# Patient Record
Sex: Female | Born: 1965 | Race: White | Hispanic: No | Marital: Married | Smoking: Current every day smoker
Health system: Southern US, Community
[De-identification: ages and names within clinical notes are randomized; demographics above are authoritative.]

## PROBLEM LIST (undated history)

## (undated) DIAGNOSIS — I071 Rheumatic tricuspid insufficiency: Secondary | ICD-10-CM

## (undated) DIAGNOSIS — F419 Anxiety disorder, unspecified: Secondary | ICD-10-CM

## (undated) DIAGNOSIS — R51 Headache: Secondary | ICD-10-CM

## (undated) DIAGNOSIS — R519 Headache, unspecified: Secondary | ICD-10-CM

## (undated) DIAGNOSIS — T7840XA Allergy, unspecified, initial encounter: Secondary | ICD-10-CM

## (undated) DIAGNOSIS — L03211 Cellulitis of face: Secondary | ICD-10-CM

## (undated) DIAGNOSIS — E039 Hypothyroidism, unspecified: Secondary | ICD-10-CM

## (undated) DIAGNOSIS — D6800 Von Willebrand disease, unspecified: Secondary | ICD-10-CM

## (undated) DIAGNOSIS — K859 Acute pancreatitis without necrosis or infection, unspecified: Secondary | ICD-10-CM

## (undated) DIAGNOSIS — H269 Unspecified cataract: Secondary | ICD-10-CM

## (undated) DIAGNOSIS — I340 Nonrheumatic mitral (valve) insufficiency: Secondary | ICD-10-CM

## (undated) DIAGNOSIS — N393 Stress incontinence (female) (male): Secondary | ICD-10-CM

## (undated) DIAGNOSIS — I951 Orthostatic hypotension: Secondary | ICD-10-CM

## (undated) DIAGNOSIS — J0120 Acute ethmoidal sinusitis, unspecified: Secondary | ICD-10-CM

## (undated) DIAGNOSIS — G4730 Sleep apnea, unspecified: Secondary | ICD-10-CM

## (undated) DIAGNOSIS — E0500 Thyrotoxicosis with diffuse goiter without thyrotoxic crisis or storm: Secondary | ICD-10-CM

## (undated) DIAGNOSIS — N904 Leukoplakia of vulva: Secondary | ICD-10-CM

## (undated) DIAGNOSIS — K219 Gastro-esophageal reflux disease without esophagitis: Secondary | ICD-10-CM

## (undated) DIAGNOSIS — E876 Hypokalemia: Secondary | ICD-10-CM

## (undated) DIAGNOSIS — Z973 Presence of spectacles and contact lenses: Secondary | ICD-10-CM

## (undated) DIAGNOSIS — R079 Chest pain, unspecified: Secondary | ICD-10-CM

## (undated) DIAGNOSIS — R112 Nausea with vomiting, unspecified: Secondary | ICD-10-CM

## (undated) DIAGNOSIS — F32A Depression, unspecified: Secondary | ICD-10-CM

## (undated) DIAGNOSIS — E785 Hyperlipidemia, unspecified: Secondary | ICD-10-CM

## (undated) DIAGNOSIS — I10 Essential (primary) hypertension: Secondary | ICD-10-CM

## (undated) DIAGNOSIS — M1990 Unspecified osteoarthritis, unspecified site: Secondary | ICD-10-CM

## (undated) DIAGNOSIS — I6529 Occlusion and stenosis of unspecified carotid artery: Secondary | ICD-10-CM

## (undated) DIAGNOSIS — Z9889 Other specified postprocedural states: Secondary | ICD-10-CM

## (undated) HISTORY — DX: Thyrotoxicosis with diffuse goiter without thyrotoxic crisis or storm: E05.00

## (undated) HISTORY — DX: Hypothyroidism, unspecified: E03.9

## (undated) HISTORY — PX: POLYPECTOMY: SHX149

## (undated) HISTORY — DX: Depression, unspecified: F32.A

## (undated) HISTORY — DX: Orthostatic hypotension: I95.1

## (undated) HISTORY — DX: Occlusion and stenosis of unspecified carotid artery: I65.29

## (undated) HISTORY — DX: Hyperlipidemia, unspecified: E78.5

## (undated) HISTORY — DX: Von Willebrand's disease: D68.0

## (undated) HISTORY — PX: BREAST SURGERY: SHX581

## (undated) HISTORY — PX: CHOLECYSTECTOMY: SHX55

## (undated) HISTORY — DX: Hypokalemia: E87.6

## (undated) HISTORY — DX: Von Willebrand disease, unspecified: D68.00

## (undated) HISTORY — PX: APPENDECTOMY: SHX54

## (undated) HISTORY — DX: Unspecified cataract: H26.9

## (undated) HISTORY — DX: Leukoplakia of vulva: N90.4

## (undated) HISTORY — DX: Acute ethmoidal sinusitis, unspecified: J01.20

## (undated) HISTORY — DX: Stress incontinence (female) (male): N39.3

## (undated) HISTORY — DX: Allergy, unspecified, initial encounter: T78.40XA

## (undated) HISTORY — DX: Cellulitis of face: L03.211

## (undated) HISTORY — PX: EYE SURGERY: SHX253

## (undated) HISTORY — PX: COSMETIC SURGERY: SHX468

## (undated) HISTORY — PX: ABDOMINAL HYSTERECTOMY: SHX81

## (undated) HISTORY — PX: BREAST EXCISIONAL BIOPSY: SUR124

---

## 2006-01-07 ENCOUNTER — Ambulatory Visit: Payer: Self-pay | Admitting: Unknown Physician Specialty

## 2006-08-06 ENCOUNTER — Ambulatory Visit: Payer: Self-pay | Admitting: Gastroenterology

## 2007-09-29 ENCOUNTER — Ambulatory Visit: Payer: Self-pay | Admitting: Internal Medicine

## 2008-01-08 ENCOUNTER — Emergency Department: Payer: Self-pay | Admitting: Emergency Medicine

## 2008-12-22 ENCOUNTER — Ambulatory Visit: Payer: Self-pay | Admitting: Internal Medicine

## 2009-01-03 ENCOUNTER — Ambulatory Visit: Payer: Self-pay | Admitting: Internal Medicine

## 2009-03-13 ENCOUNTER — Ambulatory Visit: Payer: Self-pay | Admitting: Internal Medicine

## 2009-03-22 ENCOUNTER — Ambulatory Visit: Payer: Self-pay | Admitting: Gastroenterology

## 2009-04-19 ENCOUNTER — Ambulatory Visit: Payer: Self-pay | Admitting: Gastroenterology

## 2009-05-11 ENCOUNTER — Ambulatory Visit: Payer: Self-pay | Admitting: Gastroenterology

## 2009-06-17 ENCOUNTER — Emergency Department (HOSPITAL_COMMUNITY): Admission: EM | Admit: 2009-06-17 | Discharge: 2009-06-17 | Payer: Self-pay | Admitting: Emergency Medicine

## 2009-06-17 ENCOUNTER — Emergency Department: Payer: Self-pay | Admitting: Emergency Medicine

## 2009-08-03 ENCOUNTER — Ambulatory Visit: Payer: Self-pay | Admitting: Internal Medicine

## 2010-09-16 ENCOUNTER — Encounter: Payer: Self-pay | Admitting: Internal Medicine

## 2010-09-25 ENCOUNTER — Ambulatory Visit: Payer: Self-pay | Admitting: Internal Medicine

## 2010-10-17 ENCOUNTER — Emergency Department: Payer: Self-pay | Admitting: Orthopaedic Surgery

## 2010-11-05 ENCOUNTER — Ambulatory Visit: Payer: Self-pay | Admitting: Emergency Medicine

## 2010-11-29 LAB — URINALYSIS, ROUTINE W REFLEX MICROSCOPIC
Bilirubin Urine: NEGATIVE
Hgb urine dipstick: NEGATIVE
Ketones, ur: NEGATIVE mg/dL
Nitrite: NEGATIVE
pH: 8 (ref 5.0–8.0)

## 2010-11-29 LAB — URINE MICROSCOPIC-ADD ON

## 2010-11-29 LAB — POCT PREGNANCY, URINE: Preg Test, Ur: NEGATIVE

## 2011-01-09 ENCOUNTER — Ambulatory Visit: Payer: Self-pay | Admitting: Internal Medicine

## 2011-01-15 ENCOUNTER — Ambulatory Visit: Payer: Self-pay | Admitting: Rheumatology

## 2011-01-22 ENCOUNTER — Ambulatory Visit: Payer: Self-pay | Admitting: Emergency Medicine

## 2011-05-27 IMAGING — NM NUCLEAR MEDICINE HEPATOHBILIARY INCLUDE GB
2 series · 12 of 12 positions shown · non-contrast
Comparison: none

REASON FOR EXAM: RUQ Pain
COMMENTS:

[Series 1000: gallbladder dynamic · 4.80mm/px · 6 of 60 frames shown]
[frame 6/60]
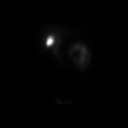
[frame 16/60]
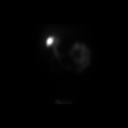
[frame 26/60]
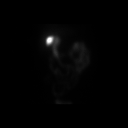
[frame 36/60]
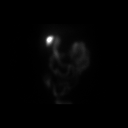
[frame 46/60]
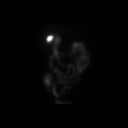
[frame 56/60]
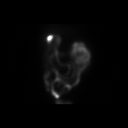

[Series 1000: gallbladder dynamic (results) · 4.80mm/px · 6 of 60 frames shown]
[frame 6/60]
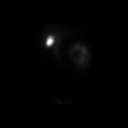
[frame 16/60]
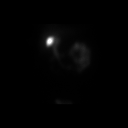
[frame 26/60]
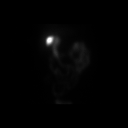
[frame 36/60]
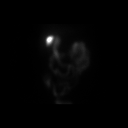
[frame 46/60]
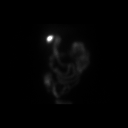
[frame 56/60]
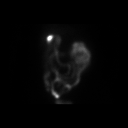

[12 of 12 positions shown; findings below may reference images not displayed]

PROCEDURE:     KNM - KNM HEPATO W/GB EJECT FRACTION  - November 05, 2010 [DATE]

RESULT:     Following intravenous administration of 7.51 mCi technetium 99m
Choletec, there is noted prompt visualization of tracer activity in the
liver at 3 minutes. At 40 minutes tracer activity is visualized in the
gallbladder, common duct and proximal small bowel.

The gallbladder ejection fraction at 30 minutes measures 81%, which is
within the normal range.
IMPRESSION: 1. Normal hepatobiliary scan.
2. The gallbladder ejection fraction measures 81%, which is in the normal
range.

## 2012-01-29 ENCOUNTER — Ambulatory Visit: Payer: Self-pay | Admitting: Internal Medicine

## 2013-04-10 ENCOUNTER — Emergency Department: Payer: Self-pay | Admitting: Emergency Medicine

## 2013-04-12 ENCOUNTER — Emergency Department: Payer: Self-pay | Admitting: Emergency Medicine

## 2013-04-29 ENCOUNTER — Ambulatory Visit: Payer: Self-pay | Admitting: Surgery

## 2013-04-29 LAB — CBC WITH DIFFERENTIAL/PLATELET
Basophil #: 0.1 10*3/uL (ref 0.0–0.1)
Basophil %: 1.1 %
Eosinophil #: 0.1 10*3/uL (ref 0.0–0.7)
Eosinophil %: 0.7 %
HCT: 41.5 % (ref 35.0–47.0)
Lymphocyte #: 2.3 10*3/uL (ref 1.0–3.6)
MCH: 31.4 pg (ref 26.0–34.0)
MCHC: 34.6 g/dL (ref 32.0–36.0)
MCV: 91 fL (ref 80–100)
Monocyte #: 0.9 x10 3/mm (ref 0.2–0.9)
Monocyte %: 7.1 %
Neutrophil %: 72.8 %
Platelet: 256 10*3/uL (ref 150–440)
RDW: 12.8 % (ref 11.5–14.5)

## 2013-04-29 LAB — HEPATIC FUNCTION PANEL A (ARMC)
Albumin: 3.7 g/dL (ref 3.4–5.0)
Alkaline Phosphatase: 65 U/L (ref 50–136)
Bilirubin, Direct: 0.1 mg/dL (ref 0.00–0.20)
Bilirubin,Total: 0.5 mg/dL (ref 0.2–1.0)
SGOT(AST): 16 U/L (ref 15–37)
SGPT (ALT): 17 U/L (ref 12–78)
Total Protein: 6.9 g/dL (ref 6.4–8.2)

## 2013-04-29 LAB — BASIC METABOLIC PANEL
Anion Gap: 3 — ABNORMAL LOW (ref 7–16)
BUN: 11 mg/dL (ref 7–18)
Calcium, Total: 9.1 mg/dL (ref 8.5–10.1)
Co2: 30 mmol/L (ref 21–32)
EGFR (African American): >60
Glucose: 80 mg/dL (ref 65–99)
Osmolality: 276 (ref 275–301)
Potassium: 3.4 mmol/L — ABNORMAL LOW (ref 3.5–5.1)
Sodium: 139 mmol/L (ref 136–145)

## 2013-05-14 ENCOUNTER — Ambulatory Visit: Payer: Self-pay | Admitting: Surgery

## 2013-06-15 ENCOUNTER — Ambulatory Visit: Payer: Self-pay | Admitting: Gynecologic Oncology

## 2013-06-29 ENCOUNTER — Ambulatory Visit: Payer: Self-pay | Admitting: Gynecologic Oncology

## 2013-07-26 ENCOUNTER — Ambulatory Visit: Payer: Self-pay | Admitting: Gynecologic Oncology

## 2013-07-27 ENCOUNTER — Ambulatory Visit: Payer: Self-pay | Admitting: Nurse Practitioner

## 2014-04-18 ENCOUNTER — Emergency Department: Payer: Self-pay | Admitting: Emergency Medicine

## 2014-04-18 LAB — COMPREHENSIVE METABOLIC PANEL
ALK PHOS: 62 U/L
AST: 20 U/L (ref 15–37)
Albumin: 3.9 g/dL (ref 3.4–5.0)
Anion Gap: 9 (ref 7–16)
BUN: 13 mg/dL (ref 7–18)
Bilirubin,Total: 1.8 mg/dL — ABNORMAL HIGH (ref 0.2–1.0)
CALCIUM: 8.9 mg/dL (ref 8.5–10.1)
CO2: 26 mmol/L (ref 21–32)
Chloride: 107 mmol/L (ref 98–107)
Creatinine: 0.92 mg/dL (ref 0.60–1.30)
Glucose: 123 mg/dL — ABNORMAL HIGH (ref 65–99)
Osmolality: 285 (ref 275–301)
POTASSIUM: 3.3 mmol/L — AB (ref 3.5–5.1)
SGPT (ALT): 19 U/L
Sodium: 142 mmol/L (ref 136–145)
Total Protein: 7.3 g/dL (ref 6.4–8.2)

## 2014-04-18 LAB — CBC WITH DIFFERENTIAL/PLATELET
BASOS PCT: 0.7 %
Basophil #: 0.1 10*3/uL (ref 0.0–0.1)
EOS PCT: 0.9 %
Eosinophil #: 0.1 10*3/uL (ref 0.0–0.7)
HCT: 45.1 % (ref 35.0–47.0)
HGB: 14.9 g/dL (ref 12.0–16.0)
LYMPHS PCT: 19.2 %
Lymphocyte #: 1.5 10*3/uL (ref 1.0–3.6)
MCH: 30.9 pg (ref 26.0–34.0)
MCHC: 33.1 g/dL (ref 32.0–36.0)
MCV: 93 fL (ref 80–100)
MONOS PCT: 6.2 %
Monocyte #: 0.5 x10 3/mm (ref 0.2–0.9)
Neutrophil #: 5.7 10*3/uL (ref 1.4–6.5)
Neutrophil %: 73 %
PLATELETS: 244 10*3/uL (ref 150–440)
RBC: 4.84 10*6/uL (ref 3.80–5.20)
RDW: 13.5 % (ref 11.5–14.5)
WBC: 7.8 10*3/uL (ref 3.6–11.0)

## 2014-04-18 LAB — URINALYSIS, COMPLETE
BILIRUBIN, UR: NEGATIVE
Bacteria: NONE SEEN
GLUCOSE, UR: NEGATIVE mg/dL (ref 0–75)
Ketone: NEGATIVE
LEUKOCYTE ESTERASE: NEGATIVE
NITRITE: NEGATIVE
PROTEIN: NEGATIVE
Ph: 7 (ref 4.5–8.0)
Specific Gravity: 1.017 (ref 1.003–1.030)

## 2014-04-18 LAB — LIPASE, BLOOD: Lipase: 719 U/L — ABNORMAL HIGH (ref 73–393)

## 2014-04-29 ENCOUNTER — Ambulatory Visit: Payer: Self-pay | Admitting: Gastroenterology

## 2014-05-09 ENCOUNTER — Ambulatory Visit: Payer: Self-pay | Admitting: Gastroenterology

## 2014-12-11 ENCOUNTER — Encounter (HOSPITAL_COMMUNITY): Payer: Self-pay

## 2014-12-11 ENCOUNTER — Encounter (HOSPITAL_COMMUNITY): Payer: Self-pay | Admitting: Emergency Medicine

## 2014-12-11 ENCOUNTER — Emergency Department (HOSPITAL_COMMUNITY)
Admission: EM | Admit: 2014-12-11 | Discharge: 2014-12-11 | Disposition: A | Discharge status: Home / Self Care | Source: Home / Self Care | Attending: Emergency Medicine | Admitting: Emergency Medicine

## 2014-12-11 ENCOUNTER — Emergency Department (HOSPITAL_COMMUNITY)

## 2014-12-11 ENCOUNTER — Inpatient Hospital Stay (HOSPITAL_COMMUNITY)
Admission: EM | Admit: 2014-12-11 | Discharge: 2014-12-15 | DRG: 158 | Disposition: A | Discharge status: Home / Self Care | Attending: Internal Medicine | Admitting: Internal Medicine

## 2014-12-11 DIAGNOSIS — E871 Hypo-osmolality and hyponatremia: Secondary | ICD-10-CM | POA: Diagnosis present

## 2014-12-11 DIAGNOSIS — E785 Hyperlipidemia, unspecified: Secondary | ICD-10-CM | POA: Diagnosis present

## 2014-12-11 DIAGNOSIS — J0120 Acute ethmoidal sinusitis, unspecified: Secondary | ICD-10-CM | POA: Diagnosis present

## 2014-12-11 DIAGNOSIS — K047 Periapical abscess without sinus: Secondary | ICD-10-CM

## 2014-12-11 DIAGNOSIS — J0100 Acute maxillary sinusitis, unspecified: Secondary | ICD-10-CM

## 2014-12-11 DIAGNOSIS — R6510 Systemic inflammatory response syndrome (SIRS) of non-infectious origin without acute organ dysfunction: Secondary | ICD-10-CM | POA: Diagnosis present

## 2014-12-11 DIAGNOSIS — L03211 Cellulitis of face: Secondary | ICD-10-CM | POA: Diagnosis present

## 2014-12-11 DIAGNOSIS — Z7982 Long term (current) use of aspirin: Secondary | ICD-10-CM | POA: Diagnosis not present

## 2014-12-11 DIAGNOSIS — D72829 Elevated white blood cell count, unspecified: Secondary | ICD-10-CM | POA: Diagnosis present

## 2014-12-11 DIAGNOSIS — Z79899 Other long term (current) drug therapy: Secondary | ICD-10-CM | POA: Diagnosis not present

## 2014-12-11 DIAGNOSIS — A419 Sepsis, unspecified organism: Secondary | ICD-10-CM

## 2014-12-11 DIAGNOSIS — Z720 Tobacco use: Secondary | ICD-10-CM | POA: Insufficient documentation

## 2014-12-11 DIAGNOSIS — F17210 Nicotine dependence, cigarettes, uncomplicated: Secondary | ICD-10-CM | POA: Diagnosis present

## 2014-12-11 DIAGNOSIS — F329 Major depressive disorder, single episode, unspecified: Secondary | ICD-10-CM | POA: Diagnosis present

## 2014-12-11 DIAGNOSIS — E0500 Thyrotoxicosis with diffuse goiter without thyrotoxic crisis or storm: Secondary | ICD-10-CM | POA: Diagnosis present

## 2014-12-11 DIAGNOSIS — E876 Hypokalemia: Secondary | ICD-10-CM | POA: Diagnosis present

## 2014-12-11 DIAGNOSIS — K002 Abnormalities of size and form of teeth: Secondary | ICD-10-CM | POA: Insufficient documentation

## 2014-12-11 DIAGNOSIS — I10 Essential (primary) hypertension: Secondary | ICD-10-CM | POA: Diagnosis present

## 2014-12-11 DIAGNOSIS — K029 Dental caries, unspecified: Secondary | ICD-10-CM | POA: Insufficient documentation

## 2014-12-11 HISTORY — DX: Essential (primary) hypertension: I10

## 2014-12-11 HISTORY — DX: Elevated white blood cell count, unspecified: D72.829

## 2014-12-11 HISTORY — DX: Systemic inflammatory response syndrome (sirs) of non-infectious origin without acute organ dysfunction: R65.10

## 2014-12-11 HISTORY — DX: Periapical abscess without sinus: K04.7

## 2014-12-11 LAB — CBC
HCT: 40.4 % (ref 36.0–46.0)
HCT: 37.4 % (ref 36.0–46.0)
HEMOGLOBIN: 12.8 g/dL (ref 12.0–15.0)
Hemoglobin: 14.1 g/dL (ref 12.0–15.0)
MCH: 31.5 pg (ref 26.0–34.0)
MCH: 31.5 pg (ref 26.0–34.0)
MCHC: 34.9 g/dL (ref 30.0–36.0)
MCHC: 34.2 g/dL (ref 30.0–36.0)
MCV: 90.2 fL (ref 78.0–100.0)
MCV: 92.1 fL (ref 78.0–100.0)
PLATELETS: 298 10*3/uL (ref 150–400)
Platelets: 307 10*3/uL (ref 150–400)
RBC: 4.48 MIL/uL (ref 3.87–5.11)
RBC: 4.06 MIL/uL (ref 3.87–5.11)
RDW: 13 % (ref 11.5–15.5)
RDW: 13.1 % (ref 11.5–15.5)
WBC: 14.5 10*3/uL — ABNORMAL HIGH (ref 4.0–10.5)
WBC: 13.2 10*3/uL — ABNORMAL HIGH (ref 4.0–10.5)

## 2014-12-11 LAB — BASIC METABOLIC PANEL
ANION GAP: 10 (ref 5–15)
BUN: 9 mg/dL (ref 6–23)
CALCIUM: 9.3 mg/dL (ref 8.4–10.5)
CHLORIDE: 97 mmol/L (ref 96–112)
CO2: 27 mmol/L (ref 19–32)
Creatinine, Ser: 0.81 mg/dL (ref 0.50–1.10)
GFR, EST NON AFRICAN AMERICAN: 83 mL/min — AB (ref >90)
Glucose, Bld: 110 mg/dL — ABNORMAL HIGH (ref 70–99)
Potassium: 3.4 mmol/L — ABNORMAL LOW (ref 3.5–5.1)
SODIUM: 134 mmol/L — AB (ref 135–145)

## 2014-12-11 LAB — I-STAT CHEM 8, ED
BUN: 11 mg/dL (ref 6–23)
CHLORIDE: 101 mmol/L (ref 96–112)
CREATININE: 0.7 mg/dL (ref 0.50–1.10)
Calcium, Ion: 0.95 mmol/L — ABNORMAL LOW (ref 1.12–1.23)
Glucose, Bld: 119 mg/dL — ABNORMAL HIGH (ref 70–99)
HCT: 46 % (ref 36.0–46.0)
Hemoglobin: 15.6 g/dL — ABNORMAL HIGH (ref 12.0–15.0)
Potassium: 3.5 mmol/L (ref 3.5–5.1)
SODIUM: 136 mmol/L (ref 135–145)
TCO2: 23 mmol/L (ref 0–100)

## 2014-12-11 LAB — CREATININE, SERUM: Creatinine, Ser: 0.61 mg/dL (ref 0.50–1.10)

## 2014-12-11 MED ORDER — METOCLOPRAMIDE HCL 5 MG/ML IJ SOLN
10.0000 mg | Freq: Once | INTRAMUSCULAR | Status: AC
  Administered 2014-12-11: 10 mg via INTRAVENOUS
  Filled 2014-12-11: qty 2

## 2014-12-11 MED ORDER — MORPHINE SULFATE 4 MG/ML IJ SOLN
4.0000 mg | Freq: Once | INTRAMUSCULAR | Status: AC
  Administered 2014-12-11: 4 mg via INTRAVENOUS
  Filled 2014-12-11: qty 1

## 2014-12-11 MED ORDER — HYDROMORPHONE HCL 1 MG/ML IJ SOLN
1.0000 mg | Freq: Once | INTRAMUSCULAR | Status: AC
Start: 2014-12-11 — End: 2014-12-11
  Administered 2014-12-11: 1 mg via INTRAVENOUS
  Filled 2014-12-11: qty 1

## 2014-12-11 MED ORDER — SIMVASTATIN 10 MG PO TABS
10.0000 mg | ORAL_TABLET | Freq: Every day | ORAL | Status: DC
  Administered 2014-12-12 – 2014-12-15 (×4): 10 mg via ORAL
  Filled 2014-12-11 (×5): qty 1

## 2014-12-11 MED ORDER — ONDANSETRON HCL 4 MG/2ML IJ SOLN
4.0000 mg | Freq: Four times a day (QID) | INTRAMUSCULAR | Status: DC | PRN
  Administered 2014-12-11: 4 mg via INTRAVENOUS
  Filled 2014-12-11: qty 2

## 2014-12-11 MED ORDER — FLUTICASONE PROPIONATE 50 MCG/ACT NA SUSP
1.0000 | Freq: Every day | NASAL | Status: DC
  Administered 2014-12-12 – 2014-12-14 (×3): 2 via NASAL
  Administered 2014-12-15: 1 via NASAL
  Filled 2014-12-11: qty 16

## 2014-12-11 MED ORDER — HYDROCODONE-ACETAMINOPHEN 5-325 MG PO TABS
1.0000 | ORAL_TABLET | ORAL | Status: DC | PRN
  Administered 2014-12-11: 1 via ORAL
  Administered 2014-12-12: 2 via ORAL
  Administered 2014-12-12 (×5): 1 via ORAL
  Administered 2014-12-13 – 2014-12-14 (×6): 2 via ORAL
  Administered 2014-12-14: 1 via ORAL
  Administered 2014-12-14 (×2): 2 via ORAL
  Administered 2014-12-14 – 2014-12-15 (×3): 1 via ORAL
  Filled 2014-12-11: qty 2
  Filled 2014-12-11 (×2): qty 1
  Filled 2014-12-11: qty 2
  Filled 2014-12-11 (×2): qty 1
  Filled 2014-12-11 (×9): qty 2
  Filled 2014-12-11 (×4): qty 1

## 2014-12-11 MED ORDER — HYDROCHLOROTHIAZIDE 12.5 MG PO CAPS
12.5000 mg | ORAL_CAPSULE | Freq: Every day | ORAL | Status: DC
  Administered 2014-12-12 – 2014-12-15 (×4): 12.5 mg via ORAL
  Filled 2014-12-11 (×7): qty 1

## 2014-12-11 MED ORDER — AMOXICILLIN 500 MG PO CAPS: 500.0000 mg | ORAL_CAPSULE | Freq: Three times a day (TID) | ORAL | Status: DC

## 2014-12-11 MED ORDER — ONDANSETRON HCL 4 MG PO TABS: 4.0000 mg | ORAL_TABLET | Freq: Four times a day (QID) | ORAL | Status: DC | PRN

## 2014-12-11 MED ORDER — ONDANSETRON HCL 4 MG/2ML IJ SOLN
4.0000 mg | Freq: Once | INTRAMUSCULAR | Status: AC
  Administered 2014-12-11: 4 mg via INTRAVENOUS
  Filled 2014-12-11: qty 2

## 2014-12-11 MED ORDER — ASPIRIN EC 81 MG PO TBEC
81.0000 mg | DELAYED_RELEASE_TABLET | Freq: Every day | ORAL | Status: DC
  Administered 2014-12-12 – 2014-12-15 (×4): 81 mg via ORAL
  Filled 2014-12-11 (×5): qty 1

## 2014-12-11 MED ORDER — TRAMADOL HCL 50 MG PO TABS
50.0000 mg | ORAL_TABLET | Freq: Once | ORAL | Status: AC
Start: 2014-12-11 — End: 2014-12-11
  Administered 2014-12-11: 50 mg via ORAL
  Filled 2014-12-11: qty 1

## 2014-12-11 MED ORDER — ONDANSETRON 4 MG PO TBDP
8.0000 mg | ORAL_TABLET | Freq: Once | ORAL | Status: AC
  Administered 2014-12-11: 8 mg via ORAL
  Filled 2014-12-11: qty 2

## 2014-12-11 MED ORDER — ENOXAPARIN SODIUM 40 MG/0.4ML ~~LOC~~ SOLN
40.0000 mg | SUBCUTANEOUS | Status: DC
  Administered 2014-12-11 – 2014-12-14 (×4): 40 mg via SUBCUTANEOUS
  Filled 2014-12-11 (×5): qty 0.4

## 2014-12-11 MED ORDER — SODIUM CHLORIDE 0.9 % IV BOLUS (SEPSIS)
2000.0000 mL | Freq: Once | INTRAVENOUS | Status: AC
  Administered 2014-12-11: 2000 mL via INTRAVENOUS

## 2014-12-11 MED ORDER — VENLAFAXINE HCL ER 37.5 MG PO CP24
37.5000 mg | ORAL_CAPSULE | Freq: Every day | ORAL | Status: DC
  Administered 2014-12-12 – 2014-12-15 (×4): 37.5 mg via ORAL
  Filled 2014-12-11 (×5): qty 1

## 2014-12-11 MED ORDER — SODIUM CHLORIDE 0.9 % IV SOLN
INTRAVENOUS | Status: DC
  Administered 2014-12-11 – 2014-12-14 (×5): via INTRAVENOUS

## 2014-12-11 MED ORDER — MORPHINE SULFATE 2 MG/ML IJ SOLN: 1.0000 mg | INTRAMUSCULAR | Status: DC | PRN

## 2014-12-11 MED ORDER — CLINDAMYCIN PHOSPHATE 600 MG/50ML IV SOLN
600.0000 mg | Freq: Three times a day (TID) | INTRAVENOUS | Status: DC
  Administered 2014-12-11 – 2014-12-15 (×11): 600 mg via INTRAVENOUS
  Filled 2014-12-11 (×12): qty 50

## 2014-12-11 MED ORDER — PANTOPRAZOLE SODIUM 40 MG PO TBEC
40.0000 mg | DELAYED_RELEASE_TABLET | Freq: Every day | ORAL | Status: DC
  Administered 2014-12-12 – 2014-12-15 (×4): 40 mg via ORAL
  Filled 2014-12-11 (×4): qty 1

## 2014-12-11 NOTE — ED Provider Notes (Signed)
CSN: [id number]     Arrival date & time 12/11/14  0001 History   First MD Initiated Contact with Patient 12/11/14 0057     Chief Complaint  Patient presents with  . Dental Pain     (Consider location/radiation/quality/duration/timing/severity/associated sxs/prior Treatment) Patient is a 49 y.o. female presenting with tooth pain. The history is provided by the patient and medical records. No language interpreter was used.  Dental Pain Associated symptoms: facial swelling, fever and headaches   Associated symptoms: no drooling and no neck pain      Emma Barker is a 50 y.o. female  with a hx of HTN, Graves' disease, partial abdominal hysterectomy presents to the Emergency Department complaining of gradual, persistent, progressively worsening right upper molar pain onset 24 hours ago. Patient reports that she had tooth extraction by the maxillofacial surgeon for days ago.  Patient reports that due to complications with the surgery the site of extraction was packed with gauze and stitched closed. She reports she was discharged home with pain medicine but no antibiotics. She reports that initially pain was well controlled with ibuprofen but in the last 24 hours it has become uncontrollable heel with multiple doses of Tylenol, ibuprofen and hydrocodone. She reports fever of 100.1 axillary at home along with chills. Patient's husband endorses rigors.  Patient with associated otalgia and nausea. Nothing makes her symptoms better.  Everything makes her symptoms worse. She reports minimal by mouth intake in the last 24 hours due to significant pain.  Patient also endorses swelling along the lateral side of her right neck along with headaches.    Past Medical History  Diagnosis Date  . Hypertension    Past Surgical History  Procedure Laterality Date  . Abdominal hysterectomy    . Appendectomy     No family history on file. History  Substance Use Topics  . Smoking status: Current Some Day  Smoker  . Smokeless tobacco: Not on file  . Alcohol Use: No   OB History    No data available     Review of Systems  Constitutional: Positive for fever and chills. Negative for appetite change.  HENT: Positive for dental problem and facial swelling. Negative for drooling, ear pain, nosebleeds, postnasal drip, rhinorrhea and trouble swallowing.   Eyes: Negative for pain and redness.  Respiratory: Negative for cough and wheezing.   Cardiovascular: Negative for chest pain.  Gastrointestinal: Negative for nausea, vomiting and abdominal pain.  Musculoskeletal: Negative for neck pain and neck stiffness.  Skin: Negative for color change and rash.  Neurological: Positive for headaches. Negative for weakness and light-headedness.  All other systems reviewed and are negative.     Allergies  Contrast media  Home Medications   Prior to Admission medications   Not on File   BP 157/84 mmHg  Pulse 80  Temp(Src) 99.4 F (37.4 C) (Oral)  Resp 20  Ht 5\' 10"  (1.778 m)  Wt 200 lb (90.719 kg)  BMI 28.70 kg/m2  SpO2 97% Physical Exam  Constitutional: She appears well-developed and well-nourished. No distress.  Awake, alert, nontoxic appearance  HENT:  Head: Normocephalic and atraumatic.  Right Ear: Tympanic membrane, external ear and ear canal normal.  Left Ear: Tympanic membrane, external ear and ear canal normal.  Nose: Nose normal. Right sinus exhibits no maxillary sinus tenderness and no frontal sinus tenderness. Left sinus exhibits no maxillary sinus tenderness and no frontal sinus tenderness.  Mouth/Throat: Uvula is midline, oropharynx is clear and moist and mucous membranes  are normal. No oral lesions. Abnormal dentition. Dental caries present. No uvula swelling or lacerations. No oropharyngeal exudate, posterior oropharyngeal edema, posterior oropharyngeal erythema or tonsillar abscesses.  Tooth #2 is surgically absent with stitches in place, erythema and induration of the  surrounding gum tissue; exquisite tenderness to palpation of the site Mild swelling of the right side of the face  Eyes: Conjunctivae are normal. Pupils are equal, round, and reactive to light. Right eye exhibits no discharge. Left eye exhibits no discharge. No scleral icterus.  Neck: Normal range of motion. Neck supple.  No stridor Handling secretions without difficulty No nuchal rigidity No cervical lymphadenopathy   Cardiovascular: Normal rate, regular rhythm, normal heart sounds and intact distal pulses.   Pulmonary/Chest: Effort normal and breath sounds normal. No respiratory distress. She has no wheezes.  Equal chest rise  Abdominal: Soft. Bowel sounds are normal. She exhibits no distension and no mass. There is no tenderness. There is no rebound and no guarding.  Musculoskeletal: Normal range of motion. She exhibits no edema.  Lymphadenopathy:    She has no cervical adenopathy.  Neurological: She is alert.  Speech is clear and goal oriented Moves extremities without ataxia  Skin: Skin is warm and dry. She is not diaphoretic.  Psychiatric: She has a normal mood and affect.  Nursing note and vitals reviewed.   ED Course  Procedures (including critical care time) Labs Review Labs Reviewed  CBC - Abnormal; Notable for the following:    WBC 14.5 (*)    All other components within normal limits  BASIC METABOLIC PANEL    Imaging Review No results found.   EKG Interpretation None      MDM   Final diagnoses:  Dental infection   Emma Barker presents with fever, chills, nausea, pain out of proportion to proximally 4 days after dental extraction. Concern for deep seated abscess potentially tracking into the sinuses. Patient is very uncomfortable in the room.  Patient is allergic to IVP dye, will obtain maxillofacial CT without contrast.  2:21 AM Case discussed with and care transferred to Dr. Eulis Foster.  Concern for large dental/facial infection.  Pt with leukocytosis.   CT scan pending.  Pt will need antibiotics; inpatient vs outpatient to be determined after CT scan.    Emma Soho Nneoma Harral, PA-C 12/11/14 0222  Daleen Bo, MD 12/11/14 [phone]

## 2014-12-11 NOTE — ED Notes (Signed)
Pt reports that she is feeling much better.  IV fluid is still transfusing.

## 2014-12-11 NOTE — ED Provider Notes (Signed)
CSN: [id number]     Arrival date & time 12/11/14  1326 History   First MD Initiated Contact with Patient 12/11/14 1348     Chief Complaint  Patient presents with  . Headache     (Consider location/radiation/quality/duration/timing/severity/associated sxs/prior Treatment) HPI Complains of facial pain and swelling onset 2 days after having tooth #2 extracted 5 days ago. She denies fever. She was seen at [facility] emergency department last night. She was treated with morphine. Since leaving the hospital she has vomited 4 times. She denies fever. No other associated symptoms. She is prescribed hydrocodone last dose taken at 8 AM today. She has not taken any Zofran which was also prescribed despite vomiting. Other associated complaints include generalized weakness. Feels dehydrated. Past Medical History  Diagnosis Date  . Hypertension    Past Surgical History  Procedure Laterality Date  . Abdominal hysterectomy    . Appendectomy     History reviewed. No pertinent family history. History  Substance Use Topics  . Smoking status: Current Some Day Smoker  . Smokeless tobacco: Not on file  . Alcohol Use: No   OB History    No data available     Review of Systems  HENT: Positive for facial swelling.        Toothache  Respiratory: Negative.   Cardiovascular: Negative.   Gastrointestinal: Positive for nausea and vomiting.  Musculoskeletal: Negative.   Skin: Negative.   Neurological: Positive for weakness.  Psychiatric/Behavioral: Negative.   All other systems reviewed and are negative.     Allergies  Contrast media  Home Medications   Prior to Admission medications   Medication Sig Start Date End Date Taking? Authorizing Provider  aspirin EC 81 MG tablet Take 81 mg by mouth daily.   Yes Historical Provider, MD  cholecalciferol (VITAMIN D) 1000 UNITS tablet Take 3,000 Units by mouth daily.   Yes Historical Provider, MD  fluticasone (FLONASE) 50 MCG/ACT nasal spray Place 1-2  sprays into both nostrils daily.   Yes Historical Provider, MD  hydrochlorothiazide (MICROZIDE) 12.5 MG capsule Take 12.5 mg by mouth daily.   Yes Historical Provider, MD  omeprazole (PRILOSEC) 40 MG capsule Take 40 mg by mouth daily.   Yes Historical Provider, MD  simvastatin (ZOCOR) 10 MG tablet Take 10 mg by mouth daily.   Yes Historical Provider, MD  amoxicillin (AMOXIL) 500 MG capsule Take 1 capsule (500 mg total) by mouth 3 (three) times daily. 12/11/14   Daleen Bo, MD  venlafaxine XR (EFFEXOR-XR) 37.5 MG 24 hr capsule Take 37.5 mg by mouth daily with breakfast.    Historical Provider, MD   BP 139/78 mmHg  Pulse 91  Temp(Src) 98.4 F (36.9 C) (Oral)  Resp 16  SpO2 91% Physical Exam  Constitutional: She appears well-developed and well-nourished.  HENT:  Head: Normocephalic and atraumatic.  Right Ear: External ear normal.  Left Ear: External ear normal.  Mouth/Throat: Oropharynx is clear and moist.  Clean-appearing socket at tooth #2. No surrounding fluctuance of the gingiva. No trismus. No swelling of face.  Eyes: Conjunctivae are normal. Pupils are equal, round, and reactive to light.  Neck: Neck supple. No tracheal deviation present. No thyromegaly present.  Cardiovascular: Normal rate and regular rhythm.   No murmur heard. Pulmonary/Chest: Effort normal and breath sounds normal.  Abdominal: Soft. Bowel sounds are normal. She exhibits no distension. There is no tenderness.  Musculoskeletal: Normal range of motion. She exhibits no edema or tenderness.  Lymphadenopathy:    She has  no cervical adenopathy.  Neurological: She is alert. Coordination normal.  Skin: Skin is warm and dry. No rash noted.  Psychiatric: She has a normal mood and affect.  Nursing note and vitals reviewed.   ED Course  Procedures (including critical care time) Labs Review Labs Reviewed - No data to display  Imaging Review Ct Maxillofacial Wo Cm  12/11/2014   CLINICAL DATA:  Dental pain.  Extraction 4 days ago with fever, chills, and face swelling. Allergy to type.  EXAM: CT MAXILLOFACIAL WITHOUT CONTRAST  TECHNIQUE: Multidetector CT imaging of the maxillofacial structures was performed. Multiplanar CT image reconstructions were also generated. A small metallic BB was placed on the right temple in order to reliably differentiate right from left.  COMPARISON:  None.  FINDINGS: There is a fluid level within the right maxillary sinus, right ethmoid air cells, and middle turbinate concha bullosa. Appearance consistent with acute sinusitis with obstruction at the level of the middle meatus where there is mucosal thickening. The right frontal sinus outflow is obstructed at the frontal ethmoidal recess from mucosal thickening. No frontal sinus effusion.  This sphenoid and left maxillary sinus ostia are patent. The nasal septum is essentially midline. No periatrial fat infiltration. There has been a recent right second tooth extraction. The empty alveolus projects into the floor of the right maxillary sinus; no evidence of oral antral fistula on coronal imaging.  Bilateral staphyloma.  IMPRESSION: Acute right maxillary and ethmoid sinusitis.   Electronically Signed   By: Monte Fantasia M.D.   On: 12/11/2014 02:41     EKG Interpretation None     4:30 PM patient became lightheaded upon standing and vomited after treatment with intravenous fluids, antiemetics and intravenous opioids. Intravenous Reglan ordered MDM  Pt reports does not feel off to go home. She is unable to hold down her medication including antibiotics. Pt signed out to Dr. Stark Jock at 440 pm  Dx sinusitis Final diagnoses:  None        Orlie Dakin, MD 12/11/14 1644

## 2014-12-11 NOTE — ED Notes (Signed)
She reports having a right upper tooth extraction Wed.  C/o headache for which she was seen at [facility] this morning where she was dx with "bacterial sinus infection"--"no better".

## 2014-12-11 NOTE — Discharge Instructions (Signed)
The site of your dental extraction, does not appear to be infected at this time. You have a sinus infection in both the maxillary and ethmoid sinuses. It will help to use Afrin nasal spray twice a day for 3 or 4 days. Do not use the Afrin spray longer than 4 days. Follow-up with a primary care doctor if not better in 1-2 weeks.     Sinusitis Sinusitis is redness, soreness, and inflammation of the paranasal sinuses. Paranasal sinuses are air pockets within the bones of your face (beneath the eyes, the middle of the forehead, or above the eyes). In healthy paranasal sinuses, mucus is able to drain out, and air is able to circulate through them by way of your nose. However, when your paranasal sinuses are inflamed, mucus and air can become trapped. This can allow bacteria and other germs to grow and cause infection. Sinusitis can develop quickly and last only a short time (acute) or continue over a long period (chronic). Sinusitis that lasts for more than 12 weeks is considered chronic.  CAUSES  Causes of sinusitis include:  Allergies.  Structural abnormalities, such as displacement of the cartilage that separates your nostrils (deviated septum), which can decrease the air flow through your nose and sinuses and affect sinus drainage.  Functional abnormalities, such as when the small hairs (cilia) that line your sinuses and help remove mucus do not work properly or are not present. SIGNS AND SYMPTOMS  Symptoms of acute and chronic sinusitis are the same. The primary symptoms are pain and pressure around the affected sinuses. Other symptoms include:  Upper toothache.  Earache.  Headache.  Bad breath.  Decreased sense of smell and taste.  A cough, which worsens when you are lying flat.  Fatigue.  Fever.  Thick drainage from your nose, which often is green and may contain pus (purulent).  Swelling and warmth over the affected sinuses. DIAGNOSIS  Your health care provider will  perform a physical exam. During the exam, your health care provider may:  Look in your nose for signs of abnormal growths in your nostrils (nasal polyps).  Tap over the affected sinus to check for signs of infection.  View the inside of your sinuses (endoscopy) using an imaging device that has a light attached (endoscope). If your health care provider suspects that you have chronic sinusitis, one or more of the following tests may be recommended:  Allergy tests.  Nasal culture. A sample of mucus is taken from your nose, sent to a lab, and screened for bacteria.  Nasal cytology. A sample of mucus is taken from your nose and examined by your health care provider to determine if your sinusitis is related to an allergy. TREATMENT  Most cases of acute sinusitis are related to a viral infection and will resolve on their own within 10 days. Sometimes medicines are prescribed to help relieve symptoms (pain medicine, decongestants, nasal steroid sprays, or saline sprays).  However, for sinusitis related to a bacterial infection, your health care provider will prescribe antibiotic medicines. These are medicines that will help kill the bacteria causing the infection.  Rarely, sinusitis is caused by a fungal infection. In theses cases, your health care provider will prescribe antifungal medicine. For some cases of chronic sinusitis, surgery is needed. Generally, these are cases in which sinusitis recurs more than 3 times per year, despite other treatments. HOME CARE INSTRUCTIONS   Drink plenty of water. Water helps thin the mucus so your sinuses can drain more easily.  Use a humidifier.  Inhale steam 3 to 4 times a day (for example, sit in the bathroom with the shower running).  Apply a warm, moist washcloth to your face 3 to 4 times a day, or as directed by your health care provider.  Use saline nasal sprays to help moisten and clean your sinuses.  Take medicines only as directed by your health  care provider.  If you were prescribed either an antibiotic or antifungal medicine, finish it all even if you start to feel better. SEEK IMMEDIATE MEDICAL CARE IF:  You have increasing pain or severe headaches.  You have nausea, vomiting, or drowsiness.  You have swelling around your face.  You have vision problems.  You have a stiff neck.  You have difficulty breathing. MAKE SURE YOU:   Understand these instructions.  Will watch your condition.  Will get help right away if you are not doing well or get worse. Document Released: 08/12/2005 Document Revised: 12/27/2013 Document Reviewed: 08/27/2011 [facility] Patient Information 2015 [place], [place]. This information is not intended to replace advice given to you by your health care provider. Make sure you discuss any questions you have with your health care provider.

## 2014-12-11 NOTE — ED Notes (Signed)
MD at bedside. 

## 2014-12-11 NOTE — ED Notes (Signed)
Pt reports taking 2nd dose of amoxicillin and home dose of Effexor.

## 2014-12-11 NOTE — ED Notes (Signed)
Attempted to call report to [place].  RN will return my call.

## 2014-12-11 NOTE — H&P (Signed)
[facility] History and Physical  SHAWNAY BRAMEL SNK:[id number] DOB: [birth year] DOA: 12/11/2014  Referring physician: ED physician PCP: No primary care provider on file.   Chief Complaint: facial swelling post tooth extraction   HPI:  49 y.o. female with a hx of HTN, Graves' disease, partial abdominal hysterectomy presented to [facility] ED with main concern of progressively worsening facial swelling in jaw pain mostly in the right upper area 24 hours after she has had tooth extraction by maxillofacial surgeon. Pt explains that she has had some complications with extractions and has required packing and stitching. Pt explains pain is throbbing and constant, 7/10 in severity, worse with eating and with no specific alleviating factors, non radiating. Pt denies similar events in the past. She reports associated facial swelling right side more than left side and tenderness in the cheeks area. Pt also reports associated subjective fevers, chills, poor oral intake due to pain.   In ED, pt noted to be hemodynamically stable, VS notable for T 99.4 F, HR in 90s, otherwise stable, WBC 14.5. TR[facility] asked to admit for further evaluation.   Assessment and Plan: Active Problems: SIRS secondary to facial cellulitis with acute sinusitis - admit to medical floor - pt started on clindamycin IV until oral intake improves and can be transitioned to oral in AM if able to take PO - provide analgesia as needed - follow upon blood cultures   Hypokalemia - supplement and repeat BMP in AM  Hyponatremia - pre renal in etiology, IVF to be provided and will repeat BMP in AM  Leukocytosis  - secondary to the above - ABX Clinda - repeat CBC in AM  DVT prophylaxis - Lovenox SQ   Radiological Exams on Admission: Ct Maxillofacial Wo Cm 12/11/2014  Acute right maxillary and ethmoid sinusitis.       Code Status: Full Family Communication: Pt at bedside Disposition Plan: Admit for further evaluation     [facility] Pig[facility] [phone]   Review of Systems:  Constitutional: . Negative for diaphoresis.  HENT: Negative for hearing loss, tinnitus and ear discharge.   Eyes: Negative for blurred vision, double vision, photophobia, pain, discharge and redness.  Respiratory: Negative for cough, hemoptysis, sputum production, shortness of breath, wheezing and stridor.   Cardiovascular: Negative for chest pain, palpitations, orthopnea, claudication and leg swelling.  Gastrointestinal:  Negative for heartburn, constipation, blood in stool and melena.  Genitourinary: Negative for dysuria, urgency, frequency, hematuria and flank pain.  Musculoskeletal: Negative for myalgias, back pain, joint pain and falls.  Skin: Negative for itching and rash.  Neurological: Negative for dizziness and weakness.  Endo/Heme/Allergies: Negative for environmental allergies and polydipsia. Does not bruise/bleed easily.  Psychiatric/Behavioral: Negative for suicidal ideas. The patient is not nervous/anxious.      Past Medical History  Diagnosis Date  . Hypertension     Past Surgical History  Procedure Laterality Date  . Abdominal hysterectomy    . Appendectomy      Social History:  reports that she has been smoking.  She does not have any smokeless tobacco history on file. She reports that she does not drink alcohol or use illicit drugs.  Allergies  Allergen Reactions  . Contrast Media [Iodinated Diagnostic Agents] Hives and Nausea Only    Pt reports family history of HTN   Prior to Admission medications   Medication Sig Start Date End Date Taking? Authorizing Provider  aspirin EC 81 MG tablet Take 81 mg by mouth daily.   Yes Historical Provider, MD  cholecalciferol (VITAMIN D) 1000 UNITS tablet Take 3,000 Units by mouth daily.   Yes Historical Provider, MD  fluticasone (FLONASE) 50 MCG/ACT nasal spray Place 1-2 sprays into both nostrils daily.   Yes Historical Provider, MD  hydrochlorothiazide (MICROZIDE)  12.5 MG capsule Take 12.5 mg by mouth daily.   Yes Historical Provider, MD  omeprazole (PRILOSEC) 40 MG capsule Take 40 mg by mouth daily.   Yes Historical Provider, MD  simvastatin (ZOCOR) 10 MG tablet Take 10 mg by mouth daily.   Yes Historical Provider, MD  amoxicillin (AMOXIL) 500 MG capsule Take 1 capsule (500 mg total) by mouth 3 (three) times daily. 12/11/14   Daleen Bo, MD  venlafaxine XR (EFFEXOR-XR) 37.5 MG 24 hr capsule Take 37.5 mg by mouth daily with breakfast.    Historical Provider, MD    Physical Exam: Filed Vitals:   12/11/14 1504 12/11/14 1530 12/11/14 1600 12/11/14 1630  BP: 128/82 140/81 128/72 130/69  Pulse: 78 74 71 65  Temp:      TempSrc:      Resp: 20     SpO2: 98% 100% 93% 94%    Physical Exam  Constitutional: Appears well-developed and well-nourished. No distress.  HENT:  Right sided facial swelling mostly cheeks area, TTP in right frontal and maxillary sinus area, right side neck area with erythema and warmth to touch  Eyes: Conjunctivae and EOM are normal. PERRLA, no scleral icterus.  Neck: Normal ROM. Neck supple. No JVD. No tracheal deviation. No thyromegaly.  CVS: RRR, S1/S2 +, no murmurs, no gallops, no carotid bruit.  Pulmonary: Effort and breath sounds normal, no stridor, rhonchi, wheezes, rales.  Abdominal: Soft. BS +,  no distension, tenderness, rebound or guarding.  Musculoskeletal: Normal range of motion. No edema and no tenderness.  Lymphadenopathy: No lymphadenopathy noted, cervical, inguinal. Neuro: Alert. Normal reflexes, muscle tone coordination. No cranial nerve deficit. Skin: Skin is warm and dry. No rash noted. Not diaphoretic. No erythema. No pallor.  Psychiatric: Normal mood and affect. Behavior, judgment, thought content normal.   Labs on Admission:  Basic Metabolic Panel:  Recent Labs Lab 12/11/14 0141 12/11/14 1500  NA 134* 136  K 3.4* 3.5  CL 97 101  CO2 27  --   GLUCOSE 110* 119*  BUN 9 11  CREATININE 0.81 0.70   CALCIUM 9.3  --    CBC:  Recent Labs Lab 12/11/14 0141 12/11/14 1500  WBC 14.5*  --   HGB 14.1 15.6*  HCT 40.4 46.0  MCV 90.2  --   PLT 307  --     EKG: not indicated    If 7PM-7AM, please contact night-coverage [email or web address] Password [email or web address] 12/11/2014, 4:42 PM

## 2014-12-11 NOTE — ED Notes (Signed)
Pt urinated on the bed, pt changed and bed cleaned.

## 2014-12-11 NOTE — ED Notes (Signed)
Pt. reports right upper molar pain onset this week worse today unrelieved by prescription and OTC pain medications . Pt. had a tooth extraction last Thursday .

## 2014-12-11 NOTE — ED Notes (Signed)
Pt reports that headache, swelling, and nausea has increased since being seen at [facility] this morning.  Sts she did take a dose of prescribed antibiotics.

## 2014-12-11 NOTE — ED Notes (Signed)
Dr. Wentz at the bedside.  

## 2014-12-12 DIAGNOSIS — E876 Hypokalemia: Secondary | ICD-10-CM

## 2014-12-12 DIAGNOSIS — L03211 Cellulitis of face: Secondary | ICD-10-CM

## 2014-12-12 DIAGNOSIS — D72829 Elevated white blood cell count, unspecified: Secondary | ICD-10-CM

## 2014-12-12 LAB — CBC
HCT: 37.2 % (ref 36.0–46.0)
Hemoglobin: 12.3 g/dL (ref 12.0–15.0)
MCH: 31.1 pg (ref 26.0–34.0)
MCHC: 33.1 g/dL (ref 30.0–36.0)
MCV: 94.2 fL (ref 78.0–100.0)
PLATELETS: 308 10*3/uL (ref 150–400)
RBC: 3.95 MIL/uL (ref 3.87–5.11)
RDW: 13.2 % (ref 11.5–15.5)
WBC: 11.1 10*3/uL — AB (ref 4.0–10.5)

## 2014-12-12 LAB — BASIC METABOLIC PANEL
ANION GAP: 7 (ref 5–15)
BUN: 12 mg/dL (ref 6–23)
CHLORIDE: 103 mmol/L (ref 96–112)
CO2: 27 mmol/L (ref 19–32)
Calcium: 7.9 mg/dL — ABNORMAL LOW (ref 8.4–10.5)
Creatinine, Ser: 0.63 mg/dL (ref 0.50–1.10)
Glucose, Bld: 90 mg/dL (ref 70–99)
Potassium: 2.8 mmol/L — ABNORMAL LOW (ref 3.5–5.1)
SODIUM: 137 mmol/L (ref 135–145)

## 2014-12-12 LAB — HIV ANTIBODY (ROUTINE TESTING W REFLEX): HIV SCREEN 4TH GENERATION: NONREACTIVE

## 2014-12-12 MED ORDER — POTASSIUM CHLORIDE 10 MEQ/100ML IV SOLN
10.0000 meq | INTRAVENOUS | Status: AC
  Administered 2014-12-12: 10 meq via INTRAVENOUS
  Filled 2014-12-12 (×3): qty 100

## 2014-12-12 MED ORDER — POTASSIUM CHLORIDE CRYS ER 20 MEQ PO TBCR
40.0000 meq | EXTENDED_RELEASE_TABLET | Freq: Once | ORAL | Status: AC
  Administered 2014-12-12: 40 meq via ORAL
  Filled 2014-12-12: qty 2

## 2014-12-12 NOTE — Progress Notes (Signed)
UR complete 

## 2014-12-12 NOTE — Progress Notes (Addendum)
Patient ID: Emma Barker, female   DOB: 21-Apr-1966, 49 y.o.   MRN: [id number] [facility] PROGRESS NOTE  Emma Barker PVV:[id number] DOB: Dec 24, 1965 DOA: 12/11/2014 PCP: No primary care provider on file.  Brief narrative:    49 y.o. female with a past medical history of hypertension, graves disease, partial abd hysterectomy who presented to [facility] with worsening facial swelling on the right side and some in the neck area ever since tooth extraction 24 hours prior to this admission. He pain was 7/`0 in intensity on admission, worse with eating, pain alleviated with meds given in ED. She was stable on admission. Started on clinda for facial cellulitis.    Assessment/Plan:    Principal Problem: SIRS secondary to facial cellulitis / Leukocytosis - SIRS criteria met with low grade fever, tachycardia, leukocytosis - She was started on clindamycin - Her swelling seems to be better this am - Still has difficulty and pain with eating due to swelling - Blood cultures so far pending   Active Problems: Hypokalemia - Secondary to Hctz 12.5 mg daily - Repleted.  Essential hypertension - Continue Microzide 12.5 mg daily   Dyslipidemia - Continue simvastatin   Hyponatremia - Likely pre renal in etiology - Resolved with IV fluids   Depression - Stable - Continue Effexor    DVT Prophylaxis  - Lovenox subQ ordered while pt is in hospital    Code Status: Full.  Family Communication:  plan of care discussed with the patient; family not at the bedside  Disposition Plan: home once swelling improved, she is on IV clinda for now  IV access:  Peripheral IV  Procedures and diagnostic studies:    Ct Maxillofacial Wo Cm 12/11/2014   Acute right maxillary and ethmoid sinusitis.   Electronically Signed   By: Monte Fantasia M.D.   On: 12/11/2014 02:41   Medical Consultants:  None   Other Consultants:  None   IAnti-Infectives:   Clindamycin 12/11/2014 -->    Leisa Lenz,  MD  [facility] Pager [phone]  Time spent in minutes: 25 minutes  If 7PM-7AM, please contact night-coverage [email or web address] Password [email or web address] 12/12/2014, 5:31 PM   LOS: 1 day    HPI/Subjective: No acute overnight events. Still reprots swelling over right side of the neck and face.   Objective: Filed Vitals:   12/11/14 1805 12/11/14 2027 12/12/14 0526 12/12/14 1419  BP: 132/71 129/65 125/60 124/73  Pulse: 72 70 70 60  Temp: 98.7 F (37.1 C) 99.1 F (37.3 C) 98.5 F (36.9 C) 98.6 F (37 C)  TempSrc: Oral Oral Oral Oral  Resp: _0 Height: _1  (1.778 m)     Weight: 89.676 kg (197 lb 11.2 oz)     SpO2: 96% 98% 95% 98%    Intake/Output Summary (Last 24 hours) at 12/12/14 1731 Last data filed at 12/12/14 0900  Gross per 24 hour  Intake    120 ml  Output      0 ml  Net    120 ml    Exam:   General:  Pt is alert, no acute distress; facial cellulitis improving, some mild swelling on right side of neck and face.  Cardiovascular: Regular rate and rhythm, S1/S2 appreciated  Respiratory: no rhonchi, no wheezing  Abdomen: non tender abdomen, non distended, (+) BS  Extremities: No LE edema, palpable pulses   Neuro: No focal deficits   Data Reviewed: Basic Metabolic Panel:  Recent Labs Lab 12/11/14 0141 12/11/14 1500 12/11/14 1850  12/12/14 0532  NA 134* 136  --  137  K 3.4* 3.5  --  2.8*  CL 97 101  --  103  CO2 27  --   --  27  GLUCOSE 110* 119*  --  90  BUN 9 11  --  12  CREATININE 0.81 0.70 0.61 0.63  CALCIUM 9.3  --   --  7.9*   Liver Function Tests: No results for input(s): AST, ALT, ALKPHOS, BILITOT, PROT, ALBUMIN in the last 168 hours. No results for input(s): LIPASE, AMYLASE in the last 168 hours. No results for input(s): AMMONIA in the last 168 hours. CBC:  Recent Labs Lab 12/11/14 0141 12/11/14 1500 12/11/14 1850 12/12/14 0532  WBC 14.5*  --  13.2* 11.1*  HGB 14.1 15.6* 12.8 12.3  HCT 40.4 46.0 37.4 37.2  MCV 90.2  --   92.1 94.2  PLT 307  --  298 308   Cardiac Enzymes: No results for input(s): CKTOTAL, CKMB, CKMBINDEX, TROPONINI in the last 168 hours. BNP: Invalid input(s): POCBNP CBG: No results for input(s): GLUCAP in the last 168 hours.  No results found for this or any previous visit (from the past 240 hour(s)).   Scheduled Meds: . aspirin EC  81 mg Oral Daily  . clindamycin (CLEOCIN)   600 mg Intravenous 3 times per day  . enoxaparin (LOVENOX) injection  40 mg Subcutaneous Q24H  . fluticasone  1-2 spray Each Nare Daily  . hydrochlorothiazide  12.5 mg Oral Daily  . pantoprazole  40 mg Oral Daily  . simvastatin  10 mg Oral Daily  . venlafaxine XR  37.5 mg Oral Q breakfast   Continuous Infusions: . sodium chloride 75 mL/hr at 12/12/14 1550

## 2014-12-13 DIAGNOSIS — J0120 Acute ethmoidal sinusitis, unspecified: Secondary | ICD-10-CM | POA: Insufficient documentation

## 2014-12-13 DIAGNOSIS — L03211 Cellulitis of face: Secondary | ICD-10-CM | POA: Insufficient documentation

## 2014-12-13 NOTE — Progress Notes (Addendum)
Patient ID: Emma Barker, female   DOB: 09/12/1965, 49 y.o.   MRN: [id number] [facility] PROGRESS NOTE  Emma Barker MRN:[id number] DOB: 03/28/1966 DOA: 12/11/2014 PCP: No primary care provider on file.  Brief narrative:    49 y.o. female with a past medical history of hypertension, graves disease, partial abd hysterectomy who presented to [facility] with worsening facial swelling on the right side and some in the neck area ever since tooth extraction 24 hours prior to this admission. He pain was 7/`0 in intensity on admission, worse with eating, pain alleviated with meds given in ED. Patient was started on clinda for facial cellulitis. Maxillofacial CT showed acute right maxillary and ethmoid sinusitis.  Barrier to discharge: pt with pain due to facial swelling. She is on IV clindamycin. Once swelling improves she can be discharged.  Assessment/Plan:    Principal Problem: SIRS secondary to facial cellulitis / Leukocytosis / acute ethmoid and maxillary sinusitis  - SIRS criteria met on admission with tachycardia, eukocytosis.  - Source of infection - acute right maxillary and ethmoid sinusitis.  - Pt started on Clinda. She reports feeling little better this am. She said she reports that she feels sinus drainage. No need to repeat CT scan at this time since leukocytosis improving and no fevers. - Blood cultures to date are negative.  - Still some difficulty and pain with eating due to swelling  Active Problems: Hypokalemia - Secondary to Hctz 12.5 mg daily - Supplemented. - Follow up BMP tomorrow am.   Essential hypertension - Continue Microzide 12.5 mg daily  - BP 139/74.  Dyslipidemia - Continue simvastatin   Hyponatremia - Likely pre renal in etiology, from acute infection. - Resolved with IV fluids   Depression - Stable - Continue Effexor    DVT Prophylaxis  - Lovenox subQ ordered while pt is in hospital    Code Status: Full.  Family Communication:  plan  of care discussed with the patient; family not at the bedside  Disposition Plan: still swelling over right side of face, not stable for discharge yet. She is on IV clinda.    IV access:  Peripheral IV  Procedures and diagnostic studies:    Ct Maxillofacial Wo Cm 12/11/2014   Acute right maxillary and ethmoid sinusitis.   Electronically Signed   By: Jonathon  Watts M.D.   On: 12/11/2014 02:41   Medical Consultants:  None   Other Consultants:  None   IAnti-Infectives:   Clindamycin 12/11/2014 -->    DEVINE, ALMA, MD  [facility] Pager [phone]  Time spent in minutes: 25 minutes  If 7PM-7AM, please contact night-coverage [email or web address] Password [email or web address] 12/13/2014, 4:01 PM   LOS: 2 days    HPI/Subjective: No acute overnight events. Pt reports swelling little better.   Objective: Filed Vitals:   12/12/14 1419 12/12/14 2043 12/13/14 0509 12/13/14 1449  BP: 124/73 136/69 129/66 139/74  Pulse: 60 68 72 64  Temp: 98.6 F (37 C) 98.7 F (37.1 C) 98.9 F (37.2 C) 98.9 F (37.2 C)  TempSrc: Oral Oral Oral Oral  Resp: 18 16 16 18  Height:      Weight:      SpO2: 98% 97% 97% 97%    Intake/Output Summary (Last 24 hours) at 12/13/14 1601 Last data filed at 12/13/14 1330  Gross per 24 hour  Intake   2955 ml  Output      0 ml  Net   2955 ml    Exam:   General:    Pt is awake, no distress, mild swelling in right face.  Cardiovascular: (+) S1,S2, RRR  Respiratory: bilateral air entry, no wheezing   Abdomen: (+) BS, non tender abd, non distended  Extremities: No swelling in legs,, palpable pulses   Neuro: Non focal  Data Reviewed: Basic Metabolic Panel:  Recent Labs Lab 12/11/14 0141 12/11/14 1500 12/11/14 1850 12/12/14 0532  NA 134* 136  --  137  K 3.4* 3.5  --  2.8*  CL 97 101  --  103  CO2 27  --   --  27  GLUCOSE 110* 119*  --  90  BUN 9 11  --  12  CREATININE 0.81 0.70 0.61 0.63  CALCIUM 9.3  --   --  7.9*   Liver Function Tests: No results  for input(s): AST, ALT, ALKPHOS, BILITOT, PROT, ALBUMIN in the last 168 hours. No results for input(s): LIPASE, AMYLASE in the last 168 hours. No results for input(s): AMMONIA in the last 168 hours. CBC:  Recent Labs Lab 12/11/14 0141 12/11/14 1500 12/11/14 1850 12/12/14 0532  WBC 14.5*  --  13.2* 11.1*  HGB 14.1 15.6* 12.8 12.3  HCT 40.4 46.0 37.4 37.2  MCV 90.2  --  92.1 94.2  PLT 307  --  298 308   Cardiac Enzymes: No results for input(s): CKTOTAL, CKMB, CKMBINDEX, TROPONINI in the last 168 hours. BNP: Invalid input(s): POCBNP CBG: No results for input(s): GLUCAP in the last 168 hours.  Recent Results (from the past 240 hour(s))  Culture, blood (routine x 2)     Status: None (Preliminary result)   Collection Time: 12/11/14  6:45 PM  Result Value Ref Range Status   Specimen Description BLOOD LEFT ARM  Final   Special Requests BOTTLES DRAWN AEROBIC AND ANAEROBIC 8CC  Final   Culture   Final           BLOOD CULTURE RECEIVED NO GROWTH TO DATE CULTURE WILL BE HELD FOR 5 DAYS BEFORE ISSUING A FINAL NEGATIVE REPORT Performed at [facility]    Report Status PENDING  Incomplete  Culture, blood (routine x 2)     Status: None (Preliminary result)   Collection Time: 12/11/14  6:50 PM  Result Value Ref Range Status   Specimen Description BLOOD LEFT HAND  Final   Special Requests BOTTLES DRAWN AEROBIC ONLY 1CC  Final   Culture   Final           BLOOD CULTURE RECEIVED NO GROWTH TO DATE CULTURE WILL BE HELD FOR 5 DAYS BEFORE ISSUING A FINAL NEGATIVE REPORT Performed at [facility]    Report Status PENDING  Incomplete     Scheduled Meds: . aspirin EC  81 mg Oral Daily  . clindamycin (CLEOCIN)   600 mg Intravenous 3 times per day  . enoxaparin (LOVENOX) injection  40 mg Subcutaneous Q24H  . fluticasone  1-2 spray Each Nare Daily  . hydrochlorothiazide  12.5 mg Oral Daily  . pantoprazole  40 mg Oral Daily  . simvastatin  10 mg Oral Daily  . venlafaxine XR   37.5 mg Oral Q breakfast   Continuous Infusions: . sodium chloride 75 mL/hr at 12/13/14 [phone]

## 2014-12-14 LAB — BASIC METABOLIC PANEL
Anion gap: 6 (ref 5–15)
BUN: 8 mg/dL (ref 6–23)
CALCIUM: 8.3 mg/dL — AB (ref 8.4–10.5)
CO2: 30 mmol/L (ref 19–32)
CREATININE: 0.6 mg/dL (ref 0.50–1.10)
Chloride: 104 mmol/L (ref 96–112)
Glucose, Bld: 94 mg/dL (ref 70–99)
Potassium: 2.9 mmol/L — ABNORMAL LOW (ref 3.5–5.1)
Sodium: 140 mmol/L (ref 135–145)

## 2014-12-14 LAB — MAGNESIUM: MAGNESIUM: 1.7 mg/dL (ref 1.5–2.5)

## 2014-12-14 MED ORDER — POTASSIUM CHLORIDE CRYS ER 20 MEQ PO TBCR
40.0000 meq | EXTENDED_RELEASE_TABLET | ORAL | Status: AC
  Administered 2014-12-14 (×2): 40 meq via ORAL
  Filled 2014-12-14 (×2): qty 2

## 2014-12-14 MED ORDER — MORPHINE SULFATE 2 MG/ML IJ SOLN: 1.0000 mg | INTRAMUSCULAR | Status: DC | PRN

## 2014-12-14 NOTE — Progress Notes (Signed)
CARE MANAGEMENT NOTE 12/14/2014  Patient:  Emma Barker, Emma Barker   Account Number:  [id number]  Date Initiated:  12/14/2014  Documentation initiated by:  Edwyna Shell  Subjective/Objective Assessment:   49 yo female with SIRS secondary to cellulitis from home     Action/Plan:   discharge planning   Anticipated DC Date:  12/15/2014   Anticipated DC Plan:  [place]  CM consult      Choice offered to / List presented to:             Status of service:  Completed, signed off Medicare Important Message given?  YES (If response is "NO", the following Medicare IM given date fields will be blank) Date Medicare IM given:  12/14/2014 Medicare IM given by:  Edwyna Shell Date Additional Medicare IM given:   Additional Medicare IM given by:    Discharge Disposition:  HOME/SELF CARE  Per UR Regulation:    If discussed at Long Length of Stay Meetings, dates discussed:    Comments:  12/14/14 Charise Killian RN BSN CM [phone] Patient stated that her PCP is Dr. Chancy Milroy in [place] and her oral surgeon in [place] that performed the tooth extraction  D. Roney Jaffe [phone]. No orders no needs.

## 2014-12-14 NOTE — Progress Notes (Signed)
Patient ID: Emma Barker, female   DOB: 06-10-1966, 49 y.o.   MRN: [id number] [facility] PROGRESS NOTE  SAMIHA DENAPOLI KDT:[id number] DOB: June 10, 1966 DOA: 12/11/2014 PCP: No primary care provider on file.  Brief narrative:    49 y.o. female with a past medical history of hypertension, graves disease, partial abd hysterectomy who presented to [facility] with worsening facial swelling on the right side and some in the neck area ever since tooth extraction 24 hours prior to this admission. He pain was 7/`0 in intensity on admission, worse with eating, pain alleviated with meds given in ED. Patient was started on clinda for facial cellulitis. Maxillofacial CT showed acute right maxillary and ethmoid sinusitis.    Assessment/Plan:    Principal Problem: Facial cellulitis / Leukocytosis / acute ethmoid and maxillary sinusitis  - Source of infection - recent right dental extraction Vs acute right maxillary and ethmoid sinusitis.  - Pt empirically started on Clinda.  - Blood cultures to date are negative.  - Continues to improve.  Active Problems: Hypokalemia - Secondary to Hctz 12.5 mg daily - We'll replace aggressively and follow BMP in a.m.  Essential hypertension - Continue Microzide 12.5 mg daily  - Reasonable inpatient control  Dyslipidemia - Continue simvastatin   Hyponatremia - Likely pre renal in etiology, from acute infection. - Resolved with IV fluids   Depression - Stable - Continue Effexor    DVT Prophylaxis  - Lovenox subQ ordered while pt is in hospital    Code Status: Full.  Family Communication:  Discussed with patient's spouse at bedside Disposition Plan: Continue additional 24 hours of IV antibiotics and possible discharge home on 4/21   IV access:  Peripheral IV  Procedures and diagnostic studies:    Ct Maxillofacial Wo Cm 12/11/2014   Acute right maxillary and ethmoid sinusitis.   Electronically Signed   By: Monte Fantasia M.D.   On: 12/11/2014  02:41   Medical Consultants:  None   Other Consultants:  None   IAnti-Infectives:   Clindamycin 12/11/2014 -->    Seth Friedlander, MD, FACP, FHM. [facility] Pager [phone]  If 7PM-7AM, please contact night-coverage [email or web address] Password [facility] 12/14/2014, 5:38 PM   LOS: 3 days    HPI/Subjective: Patient reports that her right-sided facial pain and swelling are approximately 80% better compared to admission. Complains of constipation but does not want medications for same.  Objective: Filed Vitals:   12/13/14 1449 12/13/14 2147 12/14/14 0555 12/14/14 1410  BP: 139/74 139/69 131/62 132/77  Pulse: 64 62 58 61  Temp: 98.9 F (37.2 C) 98.6 F (37 C) 98.6 F (37 C) 98.5 F (36.9 C)  TempSrc: Oral Oral Oral Oral  Resp: 18 18 18 18   Height:      Weight:      SpO2: 97% 98% 96% 100%    Intake/Output Summary (Last 24 hours) at 12/14/14 1735 Last data filed at 12/14/14 1300  Gross per 24 hour  Intake   2750 ml  Output      1 ml  Net   2749 ml    Exam:   General:  Pleasant middle-aged female sitting up comfortably in bed  Cardiovascular: (+) S1,S2, RRR  Respiratory: Clear to auscultation. No increased work of breathing  Abdomen: (+) BS, non tender abd, non distended  Extremities: No swelling in legs,, palpable pulses   Neuro: Non focal. Alert and oriented  Head eye ENT: Minimal if any right facial swelling compared to left but no increased warmth, redness  or tenderness. Right upper second molar dental extraction site without acute findings  Data Reviewed: Basic Metabolic Panel:  Recent Labs Lab 12/11/14 0141 12/11/14 1500 12/11/14 1850 12/12/14 0532 12/14/14 0552  NA 134* 136  --  137 140  K 3.4* 3.5  --  2.8* 2.9*  CL 97 101  --  103 104  CO2 27  --   --  27 30  GLUCOSE 110* 119*  --  90 94  BUN 9 11  --  12 8  CREATININE 0.81 0.70 0.61 0.63 0.60  CALCIUM 9.3  --   --  7.9* 8.3*   Liver Function Tests: No results for input(s): AST, ALT,  ALKPHOS, BILITOT, PROT, ALBUMIN in the last 168 hours. No results for input(s): LIPASE, AMYLASE in the last 168 hours. No results for input(s): AMMONIA in the last 168 hours. CBC:  Recent Labs Lab 12/11/14 0141 12/11/14 1500 12/11/14 1850 12/12/14 0532  WBC 14.5*  --  13.2* 11.1*  HGB 14.1 15.6* 12.8 12.3  HCT 40.4 46.0 37.4 37.2  MCV 90.2  --  92.1 94.2  PLT 307  --  298 308   Cardiac Enzymes: No results for input(s): CKTOTAL, CKMB, CKMBINDEX, TROPONINI in the last 168 hours. BNP: Invalid input(s): POCBNP CBG: No results for input(s): GLUCAP in the last 168 hours.  Recent Results (from the past 240 hour(s))  Culture, blood (routine x 2)     Status: None (Preliminary result)   Collection Time: 12/11/14  6:45 PM  Result Value Ref Range Status   Specimen Description BLOOD LEFT ARM  Final   Special Requests BOTTLES DRAWN AEROBIC AND ANAEROBIC 8CC  Final   Culture   Final           BLOOD CULTURE RECEIVED NO GROWTH TO DATE CULTURE WILL BE HELD FOR 5 DAYS BEFORE ISSUING A FINAL NEGATIVE REPORT Performed at [facility]    Report Status PENDING  Incomplete  Culture, blood (routine x 2)     Status: None (Preliminary result)   Collection Time: 12/11/14  6:50 PM  Result Value Ref Range Status   Specimen Description BLOOD LEFT HAND  Final   Special Requests BOTTLES DRAWN AEROBIC ONLY 1CC  Final   Culture   Final           BLOOD CULTURE RECEIVED NO GROWTH TO DATE CULTURE WILL BE HELD FOR 5 DAYS BEFORE ISSUING A FINAL NEGATIVE REPORT Performed at [facility]    Report Status PENDING  Incomplete     Scheduled Meds: . aspirin EC  81 mg Oral Daily  . clindamycin (CLEOCIN)   600 mg Intravenous 3 times per day  . enoxaparin (LOVENOX) injection  40 mg Subcutaneous Q24H  . fluticasone  1-2 spray Each Nare Daily  . hydrochlorothiazide  12.5 mg Oral Daily  . pantoprazole  40 mg Oral Daily  . simvastatin  10 mg Oral Daily  . venlafaxine XR  37.5 mg Oral Q breakfast    Continuous Infusions: . sodium chloride 75 mL/hr at 12/14/14 1014

## 2014-12-15 DIAGNOSIS — E876 Hypokalemia: Secondary | ICD-10-CM | POA: Insufficient documentation

## 2014-12-15 LAB — BASIC METABOLIC PANEL
ANION GAP: 7 (ref 5–15)
BUN: 7 mg/dL (ref 6–23)
CO2: 27 mmol/L (ref 19–32)
Calcium: 8.8 mg/dL (ref 8.4–10.5)
Chloride: 105 mmol/L (ref 96–112)
Creatinine, Ser: 0.66 mg/dL (ref 0.50–1.10)
GFR calc Af Amer: >90 mL/min (ref >90)
GFR calc non Af Amer: >90 mL/min (ref >90)
Glucose, Bld: 101 mg/dL — ABNORMAL HIGH (ref 70–99)
Potassium: 3.3 mmol/L — ABNORMAL LOW (ref 3.5–5.1)
SODIUM: 139 mmol/L (ref 135–145)

## 2014-12-15 LAB — CBC
HCT: 37.9 % (ref 36.0–46.0)
Hemoglobin: 13.1 g/dL (ref 12.0–15.0)
MCH: 31.5 pg (ref 26.0–34.0)
MCHC: 34.6 g/dL (ref 30.0–36.0)
MCV: 91.1 fL (ref 78.0–100.0)
Platelets: 363 10*3/uL (ref 150–400)
RBC: 4.16 MIL/uL (ref 3.87–5.11)
RDW: 12.7 % (ref 11.5–15.5)
WBC: 6.8 10*3/uL (ref 4.0–10.5)

## 2014-12-15 MED ORDER — IBUPROFEN 200 MG PO TABS: 600.0000 mg | ORAL_TABLET | Freq: Four times a day (QID) | ORAL | Status: DC | PRN

## 2014-12-15 MED ORDER — POTASSIUM CHLORIDE CRYS ER 20 MEQ PO TBCR: 20.0000 meq | EXTENDED_RELEASE_TABLET | Freq: Every day | ORAL | Status: DC

## 2014-12-15 MED ORDER — POTASSIUM CHLORIDE CRYS ER 20 MEQ PO TBCR: 40.0000 meq | EXTENDED_RELEASE_TABLET | Freq: Once | ORAL | Status: DC

## 2014-12-15 MED ORDER — CLINDAMYCIN HCL 300 MG PO CAPS: 300.0000 mg | ORAL_CAPSULE | Freq: Four times a day (QID) | ORAL | Status: DC

## 2014-12-15 MED ORDER — SACCHAROMYCES BOULARDII 250 MG PO CAPS
250.0000 mg | ORAL_CAPSULE | Freq: Two times a day (BID) | ORAL | Status: DC
  Filled 2014-12-15 (×2): qty 1

## 2014-12-15 MED ORDER — SACCHAROMYCES BOULARDII 250 MG PO CAPS: 250.0000 mg | ORAL_CAPSULE | Freq: Two times a day (BID) | ORAL | Status: DC

## 2014-12-15 NOTE — Discharge Instructions (Signed)
Cellulitis Cellulitis is an infection of the skin and the tissue beneath it. The infected area is usually red and tender. Cellulitis occurs most often in the arms and lower legs.  CAUSES  Cellulitis is caused by bacteria that enter the skin through cracks or cuts in the skin. The most common types of bacteria that cause cellulitis are staphylococci and streptococci. SIGNS AND SYMPTOMS   Redness and warmth.  Swelling.  Tenderness or pain.  Fever. DIAGNOSIS  Your health care provider can usually determine what is wrong based on a physical exam. Blood tests may also be done. TREATMENT  Treatment usually involves taking an antibiotic medicine. HOME CARE INSTRUCTIONS   Take your antibiotic medicine as directed by your health care provider. Finish the antibiotic even if you start to feel better.  Keep the infected arm or leg elevated to reduce swelling.  Apply a warm cloth to the affected area up to 4 times per day to relieve pain.  Take medicines only as directed by your health care provider.  Keep all follow-up visits as directed by your health care provider. SEEK MEDICAL CARE IF:   You notice red streaks coming from the infected area.  Your red area gets larger or turns dark in color.  Your bone or joint underneath the infected area becomes painful after the skin has healed.  Your infection returns in the same area or another area.  You notice a swollen bump in the infected area.  You develop new symptoms.  You have a fever. SEEK IMMEDIATE MEDICAL CARE IF:   You feel very sleepy.  You develop vomiting or diarrhea.  You have a general ill feeling (malaise) with muscle aches and pains. MAKE SURE YOU:   Understand these instructions.  Will watch your condition.  Will get help right away if you are not doing well or get worse. Document Released: 05/22/2005 Document Revised: 12/27/2013 Document Reviewed: 10/28/2011 [facility] Patient Information 2015 [place], [place].  This information is not intended to replace advice given to you by your health care provider. Make sure you discuss any questions you have with your health care provider.  Sinusitis Sinusitis is redness, soreness, and inflammation of the paranasal sinuses. Paranasal sinuses are air pockets within the bones of your face (beneath the eyes, the middle of the forehead, or above the eyes). In healthy paranasal sinuses, mucus is able to drain out, and air is able to circulate through them by way of your nose. However, when your paranasal sinuses are inflamed, mucus and air can become trapped. This can allow bacteria and other germs to grow and cause infection. Sinusitis can develop quickly and last only a short time (acute) or continue over a long period (chronic). Sinusitis that lasts for more than 12 weeks is considered chronic.  CAUSES  Causes of sinusitis include:  Allergies.  Structural abnormalities, such as displacement of the cartilage that separates your nostrils (deviated septum), which can decrease the air flow through your nose and sinuses and affect sinus drainage.  Functional abnormalities, such as when the small hairs (cilia) that line your sinuses and help remove mucus do not work properly or are not present. SIGNS AND SYMPTOMS  Symptoms of acute and chronic sinusitis are the same. The primary symptoms are pain and pressure around the affected sinuses. Other symptoms include:  Upper toothache.  Earache.  Headache.  Bad breath.  Decreased sense of smell and taste.  A cough, which worsens when you are lying flat.  Fatigue.  Fever.  Thick drainage from your nose, which often is green and may contain pus (purulent).  Swelling and warmth over the affected sinuses. DIAGNOSIS  Your health care provider will perform a physical exam. During the exam, your health care provider may:  Look in your nose for signs of abnormal growths in your nostrils (nasal polyps).  Tap over the  affected sinus to check for signs of infection.  View the inside of your sinuses (endoscopy) using an imaging device that has a light attached (endoscope). If your health care provider suspects that you have chronic sinusitis, one or more of the following tests may be recommended:  Allergy tests.  Nasal culture. A sample of mucus is taken from your nose, sent to a lab, and screened for bacteria.  Nasal cytology. A sample of mucus is taken from your nose and examined by your health care provider to determine if your sinusitis is related to an allergy. TREATMENT  Most cases of acute sinusitis are related to a viral infection and will resolve on their own within 10 days. Sometimes medicines are prescribed to help relieve symptoms (pain medicine, decongestants, nasal steroid sprays, or saline sprays).  However, for sinusitis related to a bacterial infection, your health care provider will prescribe antibiotic medicines. These are medicines that will help kill the bacteria causing the infection.  Rarely, sinusitis is caused by a fungal infection. In theses cases, your health care provider will prescribe antifungal medicine. For some cases of chronic sinusitis, surgery is needed. Generally, these are cases in which sinusitis recurs more than 3 times per year, despite other treatments. HOME CARE INSTRUCTIONS   Drink plenty of water. Water helps thin the mucus so your sinuses can drain more easily.  Use a humidifier.  Inhale steam 3 to 4 times a day (for example, sit in the bathroom with the shower running).  Apply a warm, moist washcloth to your face 3 to 4 times a day, or as directed by your health care provider.  Use saline nasal sprays to help moisten and clean your sinuses.  Take medicines only as directed by your health care provider.  If you were prescribed either an antibiotic or antifungal medicine, finish it all even if you start to feel better. SEEK IMMEDIATE MEDICAL CARE IF:  You  have increasing pain or severe headaches.  You have nausea, vomiting, or drowsiness.  You have swelling around your face.  You have vision problems.  You have a stiff neck.  You have difficulty breathing. MAKE SURE YOU:   Understand these instructions.  Will watch your condition.  Will get help right away if you are not doing well or get worse. Document Released: 08/12/2005 Document Revised: 12/27/2013 Document Reviewed: 08/27/2011 [facility] Patient Information 2015 [place], [place]. This information is not intended to replace advice given to you by your health care provider. Make sure you discuss any questions you have with your health care provider.

## 2014-12-15 NOTE — Progress Notes (Signed)
Emma Barker to be D/C'd Home per MD order.  Discussed prescriptions and follow up appointments with the patient. Prescriptions given to patient, medication list explained in detail. Pt verbalized understanding.    Medication List    STOP taking these medications        amoxicillin 500 MG capsule  Commonly known as:  AMOXIL      TAKE these medications        aspirin EC 81 MG tablet  Take 81 mg by mouth daily.     cholecalciferol 1000 UNITS tablet  Commonly known as:  VITAMIN D  Take 3,000 Units by mouth daily.     clindamycin 300 MG capsule  Commonly known as:  CLEOCIN  Take 1 capsule (300 mg total) by mouth 4 (four) times daily.     fluticasone 50 MCG/ACT nasal spray  Commonly known as:  FLONASE  Place 1-2 sprays into both nostrils daily.     hydrochlorothiazide 12.5 MG capsule  Commonly known as:  MICROZIDE  Take 12.5 mg by mouth daily.     ibuprofen 200 MG tablet  Commonly known as:  ADVIL  Take 3 tablets (600 mg total) by mouth every 6 (six) hours as needed for fever, headache, mild pain or moderate pain.     omeprazole 40 MG capsule  Commonly known as:  PRILOSEC  Take 40 mg by mouth daily.     potassium chloride SA 20 MEQ tablet  Commonly known as:  K-DUR,KLOR-CON  Take 1 tablet (20 mEq total) by mouth daily.     saccharomyces boulardii 250 MG capsule  Commonly known as:  FLORASTOR  Take 1 capsule (250 mg total) by mouth 2 (two) times daily.     simvastatin 10 MG tablet  Commonly known as:  ZOCOR  Take 10 mg by mouth daily.     venlafaxine XR 37.5 MG 24 hr capsule  Commonly known as:  EFFEXOR-XR  Take 37.5 mg by mouth daily with breakfast.        Filed Vitals:   12/15/14 0759  BP: 162/90  Pulse: 70  Temp: 98.7 F (37.1 C)  Resp: 18    Skin clean, dry and intact without evidence of skin break down, no evidence of skin tears noted. IV catheter discontinued intact. Site without signs and symptoms of complications. Dressing and pressure  applied. Pt denies pain at this time. No complaints noted.  An After Visit Summary was printed and given to the patient. Patient escorted via [place], and D/C home via private auto.  Emma Barker S 12/15/2014 11:50 AM

## 2014-12-15 NOTE — Discharge Summary (Signed)
Physician Discharge Summary  Emma Barker GMW:[id number] DOB: October 04, 1965 DOA: 12/11/2014  PCP: Perrin Maltese, MD  Admit date: 12/11/2014 Discharge date: 12/15/2014  Time spent: Greater than 30 minutes  Recommendations for Outpatient Follow-up:  1. Dr. Lamonte Sakai, PCP 3 days with repeat labs (CBC & BMP). Please follow final blood culture results that were sent from the hospital. 2. Dr. Roney Jaffe, Dentist/Oral Surgeon: MDs office advised that patient call for a follow-up appointment post discharge. 3. Leave letter provided to patient to return to work on 12/19/14.  Discharge Diagnoses:  Principal Problem:   SIRS (systemic inflammatory response syndrome) Active Problems:   Dental infection   Leukocytosis   Acute ethmoidal sinusitis   Facial cellulitis   Hypokalemia   Discharge Condition: Improved & Stable  Diet recommendation: Regular diet  Filed Weights   12/11/14 1805  Weight: 89.676 kg (197 lb 11.2 oz)    History of present illness:  49 y.o. female with a past medical history of hypertension, hyperlipidemia and depression who presented to [facility] with worsening facial swelling on the right side and some in the neck area ever since tooth extraction 24 hours prior to this admission. He pain was 7/`0 in intensity on admission, worse with eating, pain alleviated with meds given in ED. Patient was started on clinda for facial cellulitis. Maxillofacial CT showed acute right maxillary and ethmoid sinusitis.  Hospital Course:   Principal Problem: Facial cellulitis / Leukocytosis / acute right ethmoid and maxillary sinusitis  - Source of infection - recent right dental extraction Vs acute right maxillary and ethmoid sinusitis.  - Pt empirically started on Clinda and completed 3 days.  - Blood cultures to date are negative.  - Continues to improve. - Discussed with infectious disease M.D. on call who recommended oral clindamycin 300 MG QID- we will complete total one-week  course. - Follow-up with PCP where he can be reassessed to see if she needs further antibiotics. - Started probiotics to minimize risk of C. Difficile. - HIV antibody screen: Non reactive - Patient advised to use ibuprofen for a couple of days for mild-to-moderate pain. She still has a supply of opioid pain medications given to her by her dentist which she can use for severe pain.  Active Problems: Hypokalemia - Secondary to Hctz 12.5 mg daily - Replaced aggressively and improved to 3.3. We will replace prior to discharge. We'll also discharge on low-dose potassium supplements given persistent hypokalemia. Follow-up with PCP in 3 days with repeat labs.  Essential hypertension - Continue Microzide 12.5 mg daily  - Reasonable inpatient control  Dyslipidemia - Continue simvastatin   Hyponatremia - Likely pre renal in etiology, from acute infection. - Resolved with IV fluids   Depression - Stable - Continue Effexor   Consultations:  None  Procedures:  None   Discharge Exam:  Complaints: Continues to feel better with improvement in right-sided facial swelling and pain. Complains of intermittent moderate 6/10 right facial pain. Denies dental area pain. Tolerating diet.  Filed Vitals:   12/14/14 1410 12/14/14 2210 12/15/14 0504 12/15/14 0759  BP: 132/77 152/82 142/74 162/90  Pulse: 61 64 61 70  Temp: 98.5 F (36.9 C) 98.9 F (37.2 C) 99 F (37.2 C) 98.7 F (37.1 C)  TempSrc: Oral Oral Oral Oral  Resp: 18 18 18 18   Height:      Weight:      SpO2: 100% 99% 97% 98%    General exam: Pleasant middle aged female sitting comfortably in bed. Respiratory  system: Clear. No increased work of breathing. Cardiovascular system: S1 & S2 heard, RRR. No JVD, murmurs, gallops, clicks or pedal edema. Gastrointestinal system: Abdomen is nondistended, soft and nontender. Normal bowel sounds heard. Central nervous system: Alert and oriented. No focal neurological  deficits. Extremities: Symmetric 5 x 5 power. HEENT: Minimal right facial swelling compared to left but no increased warmth, redness or tenderness. Right upper second molar dental extraction site without acute findings  Discharge Instructions      Discharge Instructions    Activity as tolerated - No restrictions    Complete by:  As directed      Call MD for:  extreme fatigue    Complete by:  As directed      Call MD for:  persistant dizziness or light-headedness    Complete by:  As directed      Call MD for:  persistant nausea and vomiting    Complete by:  As directed      Call MD for:  redness, tenderness, or signs of infection (pain, swelling, redness, odor or green/yellow discharge around incision site)    Complete by:  As directed      Call MD for:  severe uncontrolled pain    Complete by:  As directed      Call MD for:  temperature >100.4    Complete by:  As directed      Diet general    Complete by:  As directed             Medication List    STOP taking these medications        amoxicillin 500 MG capsule  Commonly known as:  AMOXIL      TAKE these medications        aspirin EC 81 MG tablet  Take 81 mg by mouth daily.     cholecalciferol 1000 UNITS tablet  Commonly known as:  VITAMIN D  Take 3,000 Units by mouth daily.     clindamycin 300 MG capsule  Commonly known as:  CLEOCIN  Take 1 capsule (300 mg total) by mouth 4 (four) times daily.     fluticasone 50 MCG/ACT nasal spray  Commonly known as:  FLONASE  Place 1-2 sprays into both nostrils daily.     hydrochlorothiazide 12.5 MG capsule  Commonly known as:  MICROZIDE  Take 12.5 mg by mouth daily.     ibuprofen 200 MG tablet  Commonly known as:  ADVIL  Take 3 tablets (600 mg total) by mouth every 6 (six) hours as needed for fever, headache, mild pain or moderate pain.     omeprazole 40 MG capsule  Commonly known as:  PRILOSEC  Take 40 mg by mouth daily.     potassium chloride SA 20 MEQ tablet   Commonly known as:  K-DUR,KLOR-CON  Take 1 tablet (20 mEq total) by mouth daily.     saccharomyces boulardii 250 MG capsule  Commonly known as:  FLORASTOR  Take 1 capsule (250 mg total) by mouth 2 (two) times daily.     simvastatin 10 MG tablet  Commonly known as:  ZOCOR  Take 10 mg by mouth daily.     venlafaxine XR 37.5 MG 24 hr capsule  Commonly known as:  EFFEXOR-XR  Take 37.5 mg by mouth daily with breakfast.       Follow-up Information    Follow up with Perrin Maltese, MD. Schedule an appointment as soon as possible for a visit in 3 days.  Specialty:  Internal Medicine   Why:  to be seen repeat labs (CBC & BMP). Hospital follow-up   Contact information:   [place] [id number] [phone]       Schedule an appointment as soon as possible for a visit with Dr. Roney Jaffe.   Why:  Call for appointment after discharged, to be seen in 3-5 days.       The results of significant diagnostics from this hospitalization (including imaging, microbiology, ancillary and laboratory) are listed below for reference.    Significant Diagnostic Studies: Ct Maxillofacial Wo Cm  01/02/2015   CLINICAL DATA:  Dental pain. Extraction 4 days ago with fever, chills, and face swelling. Allergy to type.  EXAM: CT MAXILLOFACIAL WITHOUT CONTRAST  TECHNIQUE: Multidetector CT imaging of the maxillofacial structures was performed. Multiplanar CT image reconstructions were also generated. A small metallic BB was placed on the right temple in order to reliably differentiate right from left.  COMPARISON:  None.  FINDINGS: There is a fluid level within the right maxillary sinus, right ethmoid air cells, and middle turbinate concha bullosa. Appearance consistent with acute sinusitis with obstruction at the level of the middle meatus where there is mucosal thickening. The right frontal sinus outflow is obstructed at the frontal ethmoidal recess from mucosal thickening. No frontal sinus  effusion.  This sphenoid and left maxillary sinus ostia are patent. The nasal septum is essentially midline. No periatrial fat infiltration. There has been a recent right second tooth extraction. The empty alveolus projects into the floor of the right maxillary sinus; no evidence of oral antral fistula on coronal imaging.  Bilateral staphyloma.  IMPRESSION: Acute right maxillary and ethmoid sinusitis.   Electronically Signed   By: Monte Fantasia M.D.   On: 01-02-2015 02:41    Microbiology: Recent Results (from the past 240 hour(s))  Culture, blood (routine x 2)     Status: None (Preliminary result)   Collection Time: Jan 02, 2015  6:45 PM  Result Value Ref Range Status   Specimen Description BLOOD LEFT ARM  Final   Special Requests BOTTLES DRAWN AEROBIC AND ANAEROBIC 8CC  Final   Culture   Final           BLOOD CULTURE RECEIVED NO GROWTH TO DATE CULTURE WILL BE HELD FOR 5 DAYS BEFORE ISSUING A FINAL NEGATIVE REPORT Performed at [facility]    Report Status PENDING  Incomplete  Culture, blood (routine x 2)     Status: None (Preliminary result)   Collection Time: 01-02-2015  6:50 PM  Result Value Ref Range Status   Specimen Description BLOOD LEFT HAND  Final   Special Requests BOTTLES DRAWN AEROBIC ONLY 1CC  Final   Culture   Final           BLOOD CULTURE RECEIVED NO GROWTH TO DATE CULTURE WILL BE HELD FOR 5 DAYS BEFORE ISSUING A FINAL NEGATIVE REPORT Performed at [facility]    Report Status PENDING  Incomplete     Labs: Basic Metabolic Panel:  Recent Labs Lab 2015/01/02 0141 01-02-15 1500 01-02-2015 1850 12/12/14 0532 12/14/14 0552 12/14/14 1818 12/15/14 0540  NA 134* 136  --  137 140  --  139  K 3.4* 3.5  --  2.8* 2.9*  --  3.3*  CL 97 101  --  103 104  --  105  CO2 27  --   --  27 30  --  27  GLUCOSE 110* 119*  --  90  94  --  101*  BUN 9 11  --  12 8  --  7  CREATININE 0.81 0.70 0.61 0.63 0.60  --  0.66  CALCIUM 9.3  --   --  7.9* 8.3*  --  8.8  MG  --   --    --   --   --  1.7  --    Liver Function Tests: No results for input(s): AST, ALT, ALKPHOS, BILITOT, PROT, ALBUMIN in the last 168 hours. No results for input(s): LIPASE, AMYLASE in the last 168 hours. No results for input(s): AMMONIA in the last 168 hours. CBC:  Recent Labs Lab 12/11/14 0141 12/11/14 1500 12/11/14 1850 12/12/14 0532 12/15/14 0540  WBC 14.5*  --  13.2* 11.1* 6.8  HGB 14.1 15.6* 12.8 12.3 13.1  HCT 40.4 46.0 37.4 37.2 37.9  MCV 90.2  --  92.1 94.2 91.1  PLT 307  --  298 308 363   Cardiac Enzymes: No results for input(s): CKTOTAL, CKMB, CKMBINDEX, TROPONINI in the last 168 hours. BNP: BNP (last 3 results) No results for input(s): BNP in the last 8760 hours.  ProBNP (last 3 results) No results for input(s): PROBNP in the last 8760 hours.  CBG: No results for input(s): GLUCAP in the last 168 hours.    Signed:  Vernell Leep, MD, FACP, FHM. [facility] Pager [phone]  If 7PM-7AM, please contact night-coverage [email or web address] Password [facility] 12/15/2014, 11:28 AM

## 2014-12-16 NOTE — H&P (Signed)
   Subjective/Chief Complaint Perianal pain, perianal mass   History of Present Illness Emma Barker is a pleasant 49 yo F with a history of thrombosed external hemorrhoid who presents with approx 12 hours of perianal pain.  She says that it began suddenly and worsened.  Excruciating.  Did not improve with topical hemorrhoidal cream.  Had history of external hemorrhoid which is now tender and tense.  H/o external thrombosed hemorrhoid on other side which improved with I and D.  + nausea with pain.  No fevers/chills.   Past History Chronic back pain 2 leaky valves H/o irregular heart beat S/p appendectomy S/p hysterectomy HTN H/o irregular heart beat Anxiety Depression Pancreatitis Gallstones   Past Medical Health Hypertension   Past Med/Surgical Hx:  Palpitations:   Back Pain, Chronic:   Anxiety:   Depression:   irreg heart beat:   2 leaking valves:   htn:   Septoplasty:   Appendectomy:   Hysterectomy:   ALLERGIES:  IVP Dye: Hives  Eggs: N/V/Diarrhea  Other -Explain in Comment Field: Unknown  Family and Social History:  Family History Non-Contributory   Social History positive  tobacco, negative ETOH, negative Illicit drugs   + Tobacco Current (within 1 year)   Place of Living Home  Here with husband   Review of Systems:  Subjective/Chief Complaint Perianal pain, nodule   Fever/Chills No   Cough No   Sputum No   Abdominal Pain No   Diarrhea No   Constipation No   Nausea/Vomiting Yes   SOB/DOE No   Chest Pain No   Dysuria No   Physical Exam:  GEN well developed, well nourished, no acute distress   HEENT pink conjunctivae, PERRL, hearing intact to voice   RESP normal resp effort  clear BS  no use of accessory muscles   CARD regular rate  no murmur  No LE edema   ABD denies tenderness  denies Flank Tenderness  no hernia  soft  normal BS  + left lateral thrombosed external hemorrhoid   SKIN normal to palpation, No rashes, No ulcers   NEURO  cranial nerves intact, negative rigidity, negative tremor   PSYCH alert, A+O to time, place, person, good insight    Assessment/Admission Diagnosis Emma Barker presents with 12 hours of perianal pain and thrombosed external hemorrhoid.   Plan Emma Barker has had a thrombosed external hemorrhoid x 12 hours.  I have informed her that there is benefit to I and D with regards to pain recovery and she would like to proceed.  Informed consent has been obtained.  Procedure note to follow.   Electronic Signatures: Floyde Parkins (MD)  (Signed 17-Aug-14 02:19)  Authored: CHIEF COMPLAINT and HISTORY, PAST MEDICAL/SURGIAL HISTORY, ALLERGIES, FAMILY AND SOCIAL HISTORY, REVIEW OF SYSTEMS, PHYSICAL EXAM, ASSESSMENT AND PLAN   Last Updated: 17-Aug-14 02:19 by Floyde Parkins (MD)

## 2014-12-16 NOTE — Op Note (Signed)
PATIENT NAME:  Emma Barker, Emma Barker MR#:  [id number] DATE OF BIRTH:  [birth year]  DATE OF SURGERY:   04/11/2013  SURGEON: Harrell Gave A. Azelea Seguin, M.D.   PREOPERATIVE DIAGNOSIS: Thrombosed left lateral external hemorrhoid.   POSTOPERATIVE DIAGNOSIS: Thrombosed left lateral external hemorrhoid.   PROCEDURE PERFORMED: Incision and drainage of thrombosed hemorrhoid.   ESTIMATED BLOOD LOSS: 10 mL.   COMPLICATIONS: None.   SPECIMENS: None.   ANESTHESIA: 1 mg of IM Dilaudid and 5 mL of 1% lidocaine with epinephrine.   INDICATION FOR SURGERY AS FOLLOWS: [name] is a 49 year old female who presents with acute onset of perianal pain and a swollen mass in her anus. It was, on visual exam, a thrombosed hemorrhoid. After 12 hours, I thought it would be beneficial for incision and drainage of hemorrhoid.   DETAILS OF PROCEDURE:  As follows; informed consent was obtained. [name] was then given 1 mg of IM Dilaudid. After she was mildly sedated, I used 1% lidocaine to infiltrate the thrombosed hemorrhoid, and then using a 11 blade knife incised the hemorrhoid. There was a large amount of thrombus. This was excised. Pressure was held on the hemorrhoid to prevent it from bleeding. Afterwards, a sterile piece of gauze, ED pad and paper tape were used to complete the dressing.  There were no immediate complications. Needle, sponge and instrument counts correct at the end of the procedure.    ____________________________ [name], MD cal:nts D: 04/11/2013 04:19:44 ET T: 04/11/2013 04:36:59 ET JOB#: [id number]  cc: Harrell Gave A. Guelda Batson, MD, <Dictator> Floyde Parkins MD ELECTRONICALLY SIGNED 04/20/2013 14:58

## 2014-12-16 NOTE — Op Note (Signed)
PATIENT NAME:  Emma Barker, Emma Barker MR#:  [id number] DATE OF BIRTH:  [birth year]  DATE OF PROCEDURE:  05/14/2013  PREOPERATIVE DIAGNOSIS: Symptomatic cholelithiasis.   POSTOPERATIVE DIAGNOSIS: Symptomatic cholelithiasis.   PROCEDURE PERFORMED: Laparoscopic cholecystectomy.   SURGEON: Consuela Mimes, M.D.   ANESTHESIA: General.   PROCEDURE IN DETAIL: The patient was placed supine on the operating room table and prepped and draped in the usual sterile fashion. A 15 mmHg CO2 pneumoperitoneum was created via a Veress needle in the infraumbilical position and this was replaced with a 5 mm trocar and a 30 degree angled scope. Remaining trocars were placed under direct visualization. The fundus was retracted superiorly and ventrally and the infundibulum was retracted laterally and dissection was begun in the triangle of Calot. Distances within the triangle were long and visualization was excellent. The cystic duct was doubly clipped and divided. There were 3 separate cystic artery branches each of which were doubly clipped and divided. There was a bifurcated anterior branch and then there was a posterior branch. The gallbladder was then removed from the liver bed with the electrocautery, placed in an Endo Catch bag and extracted from the abdomen via the epigastric port. This port site fascia was closed with a single 0 Vicryl suture with the laparoscopic puncture closure device. The right upper quadrant was reinspected, irrigated, and the effluent was clear, and hemostasis was excellent. There was no evidence of bile leakage. The clips were secure. The peritoneum was therefore desufflated and decannulated and all 4 skin sites were closed with subcuticular 5-0 Monocryl and suture strips. The patient tolerated the procedure well. There were no complications.  ____________________________ Consuela Mimes, MD wfm:sb D: 05/14/2013 10:19:48 ET T: 05/14/2013 10:28:19 ET JOB#: [id number]  cc: Consuela Mimes, MD, <Dictator> Consuela Mimes MD ELECTRONICALLY SIGNED 05/14/2013 18:01

## 2014-12-18 LAB — CULTURE, BLOOD (ROUTINE X 2)
Culture: NO GROWTH
Culture: NO GROWTH

## 2015-05-10 ENCOUNTER — Encounter: Payer: Self-pay | Admitting: *Deleted

## 2015-05-12 ENCOUNTER — Ambulatory Visit
Admission: RE | Admit: 2015-05-12 | Discharge: 2015-05-12 | Disposition: A | Discharge status: Home / Self Care | Source: Ambulatory Visit | Attending: Anesthesiology | Admitting: Anesthesiology

## 2015-05-12 ENCOUNTER — Other Ambulatory Visit: Payer: Self-pay

## 2015-05-12 DIAGNOSIS — Z01810 Encounter for preprocedural cardiovascular examination: Secondary | ICD-10-CM | POA: Insufficient documentation

## 2015-05-15 NOTE — Discharge Instructions (Signed)
[facility] °[facility] °ENDOSCOPIC SINUS SURGERY °[facility] ° °What is Functional Endoscopic Sinus Surgery? ° The Surgery involves making the natural openings of the sinuses larger by removing the bony partitions that separate the sinuses from the nasal cavity.  The natural sinus lining is preserved as much as possible to allow the sinuses to resume normal function after the surgery.  In some patients nasal polyps (excessively swollen lining of the sinuses) may be removed to relieve obstruction of the sinus openings.  The surgery is performed through the nose using lighted scopes, which eliminates the need for incisions on the face.  A septoplasty is a different procedure which is sometimes performed with sinus surgery.  It involves straightening the boy partition that separates the two sides of your nose.  A crooked or deviated septum may need repair if is obstructing the sinuses or nasal airflow.  Turbinate reduction is also often performed during sinus surgery.  The turbinates are bony proturberances from the side walls of the nose which swell and can obstruct the nose in patients with sinus and allergy problems.  Their size can be surgically reduced to help relieve nasal obstruction. ° °What Can Sinus Surgery Do For Me? ° Sinus surgery can reduce the frequency of sinus infections requiring antibiotic treatment.  This can provide improvement in nasal congestion, post-nasal drainage, facial pressure and nasal obstruction.  Surgery will NOT prevent you from ever having an infection again, so it usually only for patients who get infections 4 or more times yearly requiring antibiotics, or for infections that do not clear with antibiotics.  It will not cure nasal allergies, so patients with allergies may still require medication to treat their allergies after surgery. Surgery may improve headaches related to sinusitis, however, some people will continue to  require medication to control sinus headaches related to allergies.  Surgery will do nothing for other forms of headache (migraine, tension or cluster). ° °What Are the Risks of Endoscopic Sinus Surgery? ° Current techniques allow surgery to be performed safely with little risk, however, there are rare complications that patients should be aware of.  Because the sinuses are located around the eyes, there is risk of eye injury, including blindness, though again, this would be quite rare. This is usually a result of bleeding behind the eye during surgery, which puts the vision oat risk, though there are treatments to protect the vision and prevent permanent disrupted by surgery causing a leak of the spinal fluid that surrounds the brain.  More serious complications would include bleeding inside the brain cavity or damage to the brain.  Again, all of these complications are uncommon, and spinal fluid leaks can be safely managed surgically if they occur.  The most common complication of sinus surgery is bleeding from the nose, which may require packing or cauterization of the nose.  Continued sinus have polyps may experience recurrence of the polyps requiring revision surgery.  Alterations of sense of smell or injury to the tear ducts are also rare complications.  ° °What is the Surgery Like, and what is the Recovery? ° The Surgery usually takes a couple of hours to perform, and is usually performed under a general anesthetic (completely asleep).  Patients are usually discharged home after a couple of hours.  Sometimes during surgery it is necessary to pack the nose to control bleeding, and the packing is left in place for 24 - 48 hours, and removed by your surgeon.    If a septoplasty was performed during the procedure, there is often a splint placed which must be removed after 5-7 days.   °Discomfort: Pain is usually mild to moderate, and can be controlled by prescription pain medication or acetaminophen (Tylenol).   Aspirin, Ibuprofen (Advil, Motrin), or Naprosyn (Aleve) should be avoided, as they can cause increased bleeding.  Most patients feel sinus pressure like they have a bad head cold for several days.  Sleeping with your head elevated can help reduce swelling and facial pressure, as can ice packs over the face.  A humidifier may be helpful to keep the mucous and blood from drying in the nose.  ° °Diet: There are no specific diet restrictions, however, you should generally start with clear liquids and a light diet of bland foods because the anesthetic can cause some nausea.  Advance your diet depending on how your stomach feels.  Taking your pain medication with food will often help reduce stomach upset which pain medications can cause. ° °Nasal Saline Irrigation: It is important to remove blood clots and dried mucous from the nose as it is healing.  This is done by having you irrigate the nose at least 3 - 4 times daily with a salt water solution.  We recommend using NeilMed Sinus Rinse (available at the drug store).  Fill the squeeze bottle with the solution, bend over a sink, and insert the tip of the squeeze bottle into the nose ½ of an inch.  Point the tip of the squeeze bottle towards the inside corner of the eye on the same side your irrigating.  Squeeze the bottle and gently irrigate the nose.  If you bend forward as you do this, most of the fluid will flow back out of the nose, instead of down your throat.   The solution should be warm, near body temperature, when you irrigate.   Each time you irrigate, you should use a full squeeze bottle.  ° °Note that if you are instructed to use Nasal Steroid Sprays at any time after your surgery, irrigate with saline BEFORE using the steroid spray, so you do not wash it all out of the nose. °Another product, Nasal Saline Gel (such as AYR Nasal Saline Gel) can be applied in each nostril 3 - 4 times daily to moisture the nose and reduce scabbing or crusting. ° °Bleeding:   Bloody drainage from the nose can be expected for several days, and patients are instructed to irrigate their nose frequently with salt water to help remove mucous and blood clots.  The drainage may be dark red or brown, though some fresh blood may be seen intermittently, especially after irrigation.  Do not blow you nose, as bleeding may occur. If you must sneeze, keep your mouth open to allow air to escape through your mouth. ° °If heavy bleeding occurs: Irrigate the nose with saline to rinse out clots, then spray the nose 3 - 4 times with Afrin Nasal Decongestant Spray.  The spray will constrict the blood vessels to slow bleeding.  Pinch the lower half of your nose shut to apply pressure, and lay down with your head elevated.  Ice packs over the nose may help as well. If bleeding persists despite these measures, you should notify your doctor.  Do not use the Afrin routinely to control nasal congestion after surgery, as it can result in worsening congestion and may affect healing.  ° ° ° °Activity: Return to work varies among patients. Most patients will be   out of work at least 5 - 7 days to recover.  Patient may return to work after they are off of narcotic pain medication, and feeling well enough to perform the functions of their job.  Patients must avoid heavy lifting (over 10 pounds) or strenuous physical for 2 weeks after surgery, so your employer may need to assign you to light duty, or keep you out of work longer if light duty is not possible.  NOTE: you should not drive, operate dangerous machinery, do any mentally demanding tasks or make any important legal or financial decisions while on narcotic pain medication and recovering from the general anesthetic.  °  °Call Your Doctor Immediately if You Have Any of the Following: °1. Bleeding that you cannot control with the above measures °2. Loss of vision, double vision, bulging of the eye or black eyes. °3. Fever over 101 degrees °4. Neck stiffness with  severe headache, fever, nausea and change in mental state. °You are always encourage to call anytime with concerns, however, please call with requests for pain medication refills during office hours. ° °Office Endoscopy: During follow-up visits your doctor will remove any packing or splints that may have been placed and evaluate and clean your sinuses endoscopically.  Topical anesthetic will be used to make this as comfortable as possible, though you may want to take your pain medication prior to the visit.  How often this will need to be done varies from patient to patient.  After complete recovery from the surgery, you may need follow-up endoscopy from time to time, particularly if there is concern of recurrent infection or nasal polyps. ° °General Anesthesia, Care After °Refer to this sheet in the next few weeks. These instructions provide you with information on caring for yourself after your procedure. Your health care provider may also give you more specific instructions. Your treatment has been planned according to current medical practices, but problems sometimes occur. Call your health care provider if you have any problems or questions after your procedure. °WHAT TO EXPECT AFTER THE PROCEDURE °After the procedure, it is typical to experience: °· Sleepiness. °· Nausea and vomiting. °HOME CARE INSTRUCTIONS °· For the first 24 hours after general anesthesia: °¨ Have a responsible person with you. °¨ Do not drive a car. If you are alone, do not take public transportation. °¨ Do not drink alcohol. °¨ Do not take medicine that has not been prescribed by your health care provider. °¨ Do not sign important papers or make important decisions. °¨ You may resume a normal diet and activities as directed by your health care provider. °· Change bandages (dressings) as directed. °· If you have questions or problems that seem related to general anesthesia, call the hospital and ask for the anesthetist or anesthesiologist  on call. °SEEK MEDICAL CARE IF: °· You have nausea and vomiting that continue the day after anesthesia. °· You develop a rash. °SEEK IMMEDIATE MEDICAL CARE IF:  °· You have difficulty breathing. °· You have chest pain. °· You have any allergic problems. °Document Released: 11/18/2000 Document Revised: 08/17/2013 Document Reviewed: 02/25/2013 °Exit[employer]® Patient Information ©2015 [employer]. This information is not intended to replace advice given to you by your health care provider. Make sure you discuss any questions you have with your health care provider. ° °

## 2015-05-16 ENCOUNTER — Encounter
Admission: RE | Disposition: A | Discharge status: Home / Self Care | Payer: Self-pay | Source: Ambulatory Visit | Attending: Otolaryngology

## 2015-05-16 ENCOUNTER — Ambulatory Visit: Admitting: Student in an Organized Health Care Education/Training Program

## 2015-05-16 ENCOUNTER — Ambulatory Visit
Admission: RE | Admit: 2015-05-16 | Discharge: 2015-05-16 | Disposition: A | Discharge status: Home / Self Care | Source: Ambulatory Visit | Attending: Otolaryngology | Admitting: Otolaryngology

## 2015-05-16 ENCOUNTER — Encounter: Payer: Self-pay | Admitting: *Deleted

## 2015-05-16 DIAGNOSIS — J338 Other polyp of sinus: Secondary | ICD-10-CM | POA: Insufficient documentation

## 2015-05-16 DIAGNOSIS — G4730 Sleep apnea, unspecified: Secondary | ICD-10-CM | POA: Insufficient documentation

## 2015-05-16 DIAGNOSIS — J320 Chronic maxillary sinusitis: Secondary | ICD-10-CM | POA: Diagnosis present

## 2015-05-16 DIAGNOSIS — Z91041 Radiographic dye allergy status: Secondary | ICD-10-CM | POA: Insufficient documentation

## 2015-05-16 DIAGNOSIS — Z87891 Personal history of nicotine dependence: Secondary | ICD-10-CM | POA: Insufficient documentation

## 2015-05-16 DIAGNOSIS — Z7982 Long term (current) use of aspirin: Secondary | ICD-10-CM | POA: Insufficient documentation

## 2015-05-16 DIAGNOSIS — E0590 Thyrotoxicosis, unspecified without thyrotoxic crisis or storm: Secondary | ICD-10-CM | POA: Diagnosis not present

## 2015-05-16 DIAGNOSIS — I10 Essential (primary) hypertension: Secondary | ICD-10-CM | POA: Diagnosis not present

## 2015-05-16 DIAGNOSIS — K219 Gastro-esophageal reflux disease without esophagitis: Secondary | ICD-10-CM | POA: Insufficient documentation

## 2015-05-16 DIAGNOSIS — J322 Chronic ethmoidal sinusitis: Secondary | ICD-10-CM | POA: Insufficient documentation

## 2015-05-16 HISTORY — DX: Nausea with vomiting, unspecified: R11.2

## 2015-05-16 HISTORY — DX: Sleep apnea, unspecified: G47.30

## 2015-05-16 HISTORY — DX: Unspecified osteoarthritis, unspecified site: M19.90

## 2015-05-16 HISTORY — DX: Thyrotoxicosis with diffuse goiter without thyrotoxic crisis or storm: E05.00

## 2015-05-16 HISTORY — DX: Headache, unspecified: R51.9

## 2015-05-16 HISTORY — DX: Nonrheumatic mitral (valve) insufficiency: I34.0

## 2015-05-16 HISTORY — DX: Gastro-esophageal reflux disease without esophagitis: K21.9

## 2015-05-16 HISTORY — DX: Acute pancreatitis without necrosis or infection, unspecified: K85.90

## 2015-05-16 HISTORY — DX: Other specified postprocedural states: Z98.890

## 2015-05-16 HISTORY — DX: Headache: R51

## 2015-05-16 HISTORY — DX: Presence of spectacles and contact lenses: Z97.3

## 2015-05-16 HISTORY — DX: Anxiety disorder, unspecified: F41.9

## 2015-05-16 HISTORY — DX: Chest pain, unspecified: R07.9

## 2015-05-16 HISTORY — DX: Rheumatic tricuspid insufficiency: I07.1

## 2015-05-16 HISTORY — PX: IMAGE GUIDED SINUS SURGERY: SHX6570

## 2015-05-16 SURGERY — SINUS SURGERY, WITH IMAGING GUIDANCE
Anesthesia: General | Laterality: Right | Wound class: Clean Contaminated

## 2015-05-16 MED ORDER — ACETAMINOPHEN 10 MG/ML IV SOLN
INTRAVENOUS | Status: DC | PRN
  Administered 2015-05-16: 1000 mg via INTRAVENOUS

## 2015-05-16 MED ORDER — ONDANSETRON HCL 4 MG/2ML IJ SOLN
INTRAMUSCULAR | Status: DC | PRN
  Administered 2015-05-16: 4 mg via INTRAVENOUS

## 2015-05-16 MED ORDER — FENTANYL CITRATE (PF) 100 MCG/2ML IJ SOLN
25.0000 ug | INTRAMUSCULAR | Status: DC | PRN
  Administered 2015-05-16: 25 ug via INTRAVENOUS

## 2015-05-16 MED ORDER — LIDOCAINE-EPINEPHRINE 1 %-1:100000 IJ SOLN
INTRAMUSCULAR | Status: DC | PRN
  Administered 2015-05-16: 7 mL

## 2015-05-16 MED ORDER — DEXAMETHASONE SODIUM PHOSPHATE 4 MG/ML IJ SOLN
INTRAMUSCULAR | Status: DC | PRN
  Administered 2015-05-16: 8 mg via INTRAVENOUS

## 2015-05-16 MED ORDER — ROCURONIUM BROMIDE 100 MG/10ML IV SOLN
INTRAVENOUS | Status: DC | PRN
  Administered 2015-05-16: 30 mg via INTRAVENOUS

## 2015-05-16 MED ORDER — AMOXICILLIN-POT CLAVULANATE 875-125 MG PO TABS: 1.0000 | ORAL_TABLET | Freq: Two times a day (BID) | ORAL | Status: AC

## 2015-05-16 MED ORDER — GLYCOPYRROLATE 0.2 MG/ML IJ SOLN
INTRAMUSCULAR | Status: DC | PRN
  Administered 2015-05-16: 0.6 mg via INTRAVENOUS

## 2015-05-16 MED ORDER — LACTATED RINGERS IV SOLN: 500.0000 mL | INTRAVENOUS | Status: DC

## 2015-05-16 MED ORDER — OXYCODONE HCL 5 MG/5ML PO SOLN: 5.0000 mg | Freq: Once | ORAL | Status: AC | PRN

## 2015-05-16 MED ORDER — HYDROCODONE-ACETAMINOPHEN 5-325 MG PO TABS: ORAL_TABLET | ORAL | Status: DC

## 2015-05-16 MED ORDER — PROPOFOL 10 MG/ML IV BOLUS
INTRAVENOUS | Status: DC | PRN
  Administered 2015-05-16: 200 mg via INTRAVENOUS

## 2015-05-16 MED ORDER — LACTATED RINGERS IV SOLN
INTRAVENOUS | Status: DC
  Administered 2015-05-16: 09:00:00 via INTRAVENOUS

## 2015-05-16 MED ORDER — EPHEDRINE SULFATE 50 MG/ML IJ SOLN
INTRAMUSCULAR | Status: DC | PRN
  Administered 2015-05-16 (×2): 5 mg via INTRAVENOUS
  Administered 2015-05-16: 10 mg via INTRAVENOUS
  Administered 2015-05-16: 5 mg via INTRAVENOUS

## 2015-05-16 MED ORDER — SCOPOLAMINE 1 MG/3DAYS TD PT72
1.0000 | MEDICATED_PATCH | Freq: Once | TRANSDERMAL | Status: DC
  Administered 2015-05-16: 1.5 mg via TRANSDERMAL

## 2015-05-16 MED ORDER — LIDOCAINE HCL (CARDIAC) 20 MG/ML IV SOLN
INTRAVENOUS | Status: DC | PRN
  Administered 2015-05-16: 50 mg via INTRAVENOUS

## 2015-05-16 MED ORDER — FENTANYL CITRATE (PF) 100 MCG/2ML IJ SOLN
INTRAMUSCULAR | Status: DC | PRN
  Administered 2015-05-16: 100 ug via INTRAVENOUS

## 2015-05-16 MED ORDER — OXYCODONE HCL 5 MG PO TABS
5.0000 mg | ORAL_TABLET | Freq: Once | ORAL | Status: AC | PRN
  Administered 2015-05-16: 5 mg via ORAL

## 2015-05-16 MED ORDER — FLUCONAZOLE 150 MG PO TABS: ORAL_TABLET | ORAL | Status: DC

## 2015-05-16 MED ORDER — MIDAZOLAM HCL 5 MG/5ML IJ SOLN
INTRAMUSCULAR | Status: DC | PRN
  Administered 2015-05-16: 2 mg via INTRAVENOUS

## 2015-05-16 MED ORDER — SUCCINYLCHOLINE CHLORIDE 20 MG/ML IJ SOLN
INTRAMUSCULAR | Status: DC | PRN
  Administered 2015-05-16: 100 mg via INTRAVENOUS

## 2015-05-16 MED ORDER — OXYMETAZOLINE HCL 0.05 % NA SOLN
NASAL | Status: DC | PRN
  Administered 2015-05-16: 1

## 2015-05-16 MED ORDER — NEOSTIGMINE METHYLSULFATE 10 MG/10ML IV SOLN
INTRAVENOUS | Status: DC | PRN
  Administered 2015-05-16: 3 mg via INTRAVENOUS

## 2015-05-16 SURGICAL SUPPLY — 25 items
BALLOON SINUPLASTY SYSTEM (BALLOONS) IMPLANT
BATTERY INSTRU NAVIGATION (MISCELLANEOUS) ×12 IMPLANT
BLADE IRRIGATOR 40D CVD (IRRIGATION / IRRIGATOR) IMPLANT
CANISTER SUCT 1200ML W/VALVE (MISCELLANEOUS) ×3 IMPLANT
COAG SUCT 10F 3.5MM HAND CTRL (MISCELLANEOUS) ×3 IMPLANT
DEVICE INFLATION SEID (MISCELLANEOUS) IMPLANT
DRAPE HEAD BAR (DRAPES) ×3 IMPLANT
DRESSING NASL FOAM PST OP SINU (MISCELLANEOUS) ×1 IMPLANT
DRSG NASAL 4CM NASOPORE (MISCELLANEOUS) IMPLANT
DRSG NASAL FOAM POST OP SINU (MISCELLANEOUS) ×3
GLOVE BIO SURGEON STRL SZ7.5 (GLOVE) ×9 IMPLANT
IRRIGATOR 4MM STR (IRRIGATION / IRRIGATOR) ×3 IMPLANT
IV NS 500ML (IV SOLUTION) ×2
IV NS 500ML BAXH (IV SOLUTION) ×1 IMPLANT
NAVIGATION MASK REG  ST (MISCELLANEOUS) ×3 IMPLANT
NS IRRIG 500ML POUR BTL (IV SOLUTION) ×3 IMPLANT
PACK DRAPE NASAL/ENT (PACKS) ×3 IMPLANT
PACKING NASAL EPIS 4X2.4 XEROG (MISCELLANEOUS) IMPLANT
PAD GROUND ADULT SPLIT (MISCELLANEOUS) ×3 IMPLANT
PATTIES SURGICAL .5 X3 (DISPOSABLE) ×3 IMPLANT
SET HANDPIECE IRR DIEGO (MISCELLANEOUS) ×3 IMPLANT
SOL ANTI-FOG 6CC FOG-OUT (MISCELLANEOUS) ×1 IMPLANT
SOL FOG-OUT ANTI-FOG 6CC (MISCELLANEOUS) ×2
SYRINGE 10CC LL (SYRINGE) ×3 IMPLANT
WATER STERILE IRR 500ML POUR (IV SOLUTION) ×3 IMPLANT

## 2015-05-16 NOTE — Anesthesia Preprocedure Evaluation (Addendum)
Anesthesia Evaluation  Patient identified by MRN, date of birth, ID band Patient awake    Reviewed: Allergy & Precautions, H&P , NPO status , Patient's Chart, lab work & pertinent test results, reviewed documented beta blocker date and time   History of Anesthesia Complications (+) PONV and history of anesthetic complications  Airway Mallampati: III  TM Distance: <3 FB Neck ROM: full    Dental no notable dental hx.    Pulmonary sleep apnea , former smoker,    Pulmonary exam normal breath sounds clear to auscultation       Cardiovascular Exercise Tolerance: Good hypertension,  Rhythm:regular Rate:Normal     Neuro/Psych  Headaches, negative psych ROS   GI/Hepatic Neg liver ROS, GERD  Medicated,  Endo/Other  Hyperthyroidism   Renal/GU negative Renal ROS  negative genitourinary   Musculoskeletal   Abdominal   Peds  Hematology negative hematology ROS (+)   Anesthesia Other Findings   Reproductive/Obstetrics negative OB ROS                           Anesthesia Physical Anesthesia Plan  ASA: II  Anesthesia Plan: General   Post-op Pain Management:    Induction:   Airway Management Planned:   Additional Equipment:   Intra-op Plan:   Post-operative Plan:   Informed Consent: I have reviewed the patients History and Physical, chart, labs and discussed the procedure including the risks, benefits and alternatives for the proposed anesthesia with the patient or authorized representative who has indicated his/her understanding and acceptance.     Plan Discussed with: CRNA  Anesthesia Plan Comments:         Anesthesia Quick Evaluation

## 2015-05-16 NOTE — Transfer of Care (Signed)
Immediate Anesthesia Transfer of Care Note  Patient: Emma Barker  Procedure(s) Performed: Procedure(s) with comments: IMAGE GUIDED SINUS SURGERY, RIGHT ENDOSCOPIC MAXILLARY ANTROSTOMY, RIGHT ENDOSCOPIC ANTERIOR ETHMOIDECTOMY AND RIGHT ENDOSCOPIC FRONTAL RECESS EXPLORATION (Right) - GAVE DISK TO CE CE  Patient Location: PACU  Anesthesia Type: General  Level of Consciousness: awake, alert  and patient cooperative  Airway and Oxygen Therapy: Patient Spontanous Breathing and Patient connected to supplemental oxygen  Post-op Assessment: Post-op Vital signs reviewed, Patient's Cardiovascular Status Stable, Respiratory Function Stable, Patent Airway and No signs of Nausea or vomiting  Post-op Vital Signs: Reviewed and stable  Complications: No apparent anesthesia complications

## 2015-05-16 NOTE — H&P (Signed)
History and physical reviewed and will be scanned in later. No change in medical status reported by the patient or family, appears stable for surgery. All questions regarding the procedure answered, and patient (or family if a child) expressed understanding of the procedure.  Bennett, Paul S @TODAY@ 

## 2015-05-16 NOTE — Anesthesia Procedure Notes (Signed)
Procedure Name: Intubation Date/Time: 05/16/2015 9:51 AM Performed by: Londell Moh Pre-anesthesia Checklist: Patient identified, Emergency Drugs available, Suction available, Patient being monitored and Timeout performed Patient Re-evaluated:Patient Re-evaluated prior to inductionOxygen Delivery Method: Circle system utilized Preoxygenation: Pre-oxygenation with 100% oxygen Intubation Type: IV induction Ventilation: Mask ventilation without difficulty Laryngoscope Size: Mac and 3 Grade View: Grade II Tube type: Oral Rae Tube size: 7.0 mm Number of attempts: 2 Airway Equipment and Method: Bougie stylet Placement Confirmation: ETT inserted through vocal cords under direct vision,  positive ETCO2 and breath sounds checked- equal and bilateral Tube secured with: Tape Dental Injury: Teeth and Oropharynx as per pre-operative assessment

## 2015-05-16 NOTE — Anesthesia Postprocedure Evaluation (Signed)
  Anesthesia Post-op Note  Patient: Emma Barker  Procedure(s) Performed: Procedure(s) with comments: IMAGE GUIDED SINUS SURGERY, RIGHT ENDOSCOPIC MAXILLARY ANTROSTOMY, RIGHT ENDOSCOPIC ANTERIOR ETHMOIDECTOMY AND RIGHT ENDOSCOPIC FRONTAL RECESS EXPLORATION (Right) - GAVE DISK TO CE CE  Anesthesia type:General  Patient location: PACU  Post pain: Pain level controlled  Post assessment: Post-op Vital signs reviewed, Patient's Cardiovascular Status Stable, Respiratory Function Stable, Patent Airway and No signs of Nausea or vomiting  Post vital signs: Reviewed and stable  Last Vitals:  Filed Vitals:   05/16/15 1115  BP: 132/76  Pulse: 75  Temp:   Resp: 12    Level of consciousness: awake, alert  and patient cooperative  Complications: No apparent anesthesia complications

## 2015-05-16 NOTE — Op Note (Signed)
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 There was a large right concha bullosa of the middle turbinate  Procedure: After the patient was identified in holding and the benefits of the procedure were reviewed as well as the consent and risks, the patient was taken to the operating room and with the patient in a comfortable supine position,  general orotracheal anesthesia was induced without difficulty.  A proper time-out was performed.  The Stryker image guidance system was set up and calibrated in the normal fashion and felt to be acceptable.  Next 1% Xylocaine with 1:100,000 epinephrine was infiltrated into the inferior turbinate and anterior middle turbinate on the right.  Several minutes were allowed for this to take effect.  Cottoniod pledgets soaked in Afrin were placed into the right nasal cavity and left while the patient was prepped and draped in the standard fashion. The image guided suction was calibrated and used to inspect known points in the nasal cavity to assess accuracy of the image guided system. Accuracy was felt to be excellent.   The nasal cavity was inspected endoscopically with a 0 scope. A large polyp in the middle meatus was debrided using a Blakesley forcep and sent for pathology. Immediately purulence poured from the  right maxillary sinus and this was cultured. A sickle knife was used to incise the anterior aspect of the right middle turbinate, and endoscopic scissors used to resect the lateral aspect of a large right concha bullosa. The maxillary sinus was opened by removing the polyp and resecting the uncinate process  with through-cutting forceps as well as the microdebrider. In this fashion the uncinate was completely removed along with polypoid soft tissue and bone of the medial wall of the maxillary sinus to create a large patent maxillary antrostomy. The left maxillary sinus was suctioned to clear secretions and vigorously irrigated with sterile saline until clear. The sinus was inspected with a 70 scope. There was polypoid change noted involving the mucosa of the right maxillary sinus, but no masses of polyp remained.   Next the right anterior ethmoid sinuses were dissected beginning inferomedially, entering the ethmoid bulla. Thru cut forceps were used to open the anterior ethmoids. The microdebrider was used as needed to trim loose mucosal edges and polypoid mucosa taking care to avoid injury to the lamina papyracea laterally.  Dissection proceeded anteriorly and superiorly into the right frontal recess which was dissected utilizing a 30 scope and frontal curved instruments. The curved image guided suction was used during this dissection to frequently reassess the anatomy on the CT scan. The frontal recess was dissected and polypoid tissue removed until a suction could be passed up into the region of the frontal sinus. At the completion of the procedure a large opening was achieved into the frontal sinus.  The nose was suctioned and  inspected. Minor bleeding was controlled with the suction cautery. Stammberger absorbable sinus packing was then placed in the left ethmoid, frontal recess and maxillary region.   The patient was then returned to the anesthesiologist for awakening and taken to recovery room in  good condition postoperatively.  Disposition:   PACU and d/c home  Plan: Ice, elevation, narcotic analgesia and prophylactic antibiotics. Begin sinus irrigations with saline tomrrow, irrigating 3-4 times daily. Return to the office in 7 days.  Return to work in 7-10 days, no strenuous activities for two weeks.   Riley Nearing 05/16/2015 10:48 AM

## 2015-05-17 ENCOUNTER — Encounter: Payer: Self-pay | Admitting: Otolaryngology

## 2015-05-18 LAB — SURGICAL PATHOLOGY

## 2015-06-14 DIAGNOSIS — I10 Essential (primary) hypertension: Secondary | ICD-10-CM | POA: Insufficient documentation

## 2015-06-14 DIAGNOSIS — E039 Hypothyroidism, unspecified: Secondary | ICD-10-CM | POA: Insufficient documentation

## 2015-06-14 DIAGNOSIS — E7800 Pure hypercholesterolemia, unspecified: Secondary | ICD-10-CM | POA: Insufficient documentation

## 2015-06-14 DIAGNOSIS — N951 Menopausal and female climacteric states: Secondary | ICD-10-CM | POA: Insufficient documentation

## 2015-06-14 DIAGNOSIS — E785 Hyperlipidemia, unspecified: Secondary | ICD-10-CM | POA: Insufficient documentation

## 2015-07-05 ENCOUNTER — Encounter: Payer: Self-pay | Admitting: Otolaryngology

## 2015-07-12 DIAGNOSIS — K219 Gastro-esophageal reflux disease without esophagitis: Secondary | ICD-10-CM | POA: Insufficient documentation

## 2015-07-13 ENCOUNTER — Other Ambulatory Visit: Payer: Self-pay | Admitting: Internal Medicine

## 2015-07-13 DIAGNOSIS — Z1239 Encounter for other screening for malignant neoplasm of breast: Secondary | ICD-10-CM

## 2015-07-26 ENCOUNTER — Ambulatory Visit

## 2015-09-29 LAB — HM PAP SMEAR: HM PAP: NEGATIVE

## 2015-10-06 ENCOUNTER — Ambulatory Visit (INDEPENDENT_AMBULATORY_CARE_PROVIDER_SITE_OTHER): Admitting: Urology

## 2015-10-06 ENCOUNTER — Encounter: Payer: Self-pay | Admitting: Urology

## 2015-10-06 VITALS — BP 147/77 | HR 73 | Resp 16 | Ht 70.0 in | Wt 219.2 lb

## 2015-10-06 DIAGNOSIS — R350 Frequency of micturition: Secondary | ICD-10-CM | POA: Diagnosis not present

## 2015-10-06 DIAGNOSIS — N393 Stress incontinence (female) (male): Secondary | ICD-10-CM | POA: Diagnosis not present

## 2015-10-06 DIAGNOSIS — R32 Unspecified urinary incontinence: Secondary | ICD-10-CM | POA: Diagnosis not present

## 2015-10-06 NOTE — Progress Notes (Signed)
10/06/2015 4:36 PM   Emma Barker 03-01-1966 [id number]  Referring provider: Perrin Maltese, MD [address] [place], [place] [address]  Chief Complaint  Patient presents with  . Urinary Incontinence  . Establish Care    HPI: The patient is a 50 year old woman who in 1998 had an abdominal hysterectomy with bladder repair.  She did well for a number years but now leaks with coughing sneezing. She leaks if her bladder is full and she bends. She has urge incontinence above the stress component is more significant. She says a new symptoms his urinary incontinence not associated with awareness removing a small amount of urine. She wears 2-3 pads a day sometimes damp and other times very wet  She voids every 1-2 arch and the day gets up once or twice at night. She has no neurologic or risk factors or symptoms. She's had a hysterectomy. Her bowel movements alternate between diarrhea and constipation. She does not have chronic cystitis  Modifying factors: There are no other modifying factors  Associated signs and symptoms: There are no other associated signs and symptoms Aggravating and relieving factors: There are no other aggravating or relieving factors Severity: Moderate Duration: Persistent     PMH: Past Medical History  Diagnosis Date  . PONV (postoperative nausea and vomiting)   . Hypertension   . Tricuspid valve regurgitation   . Mitral valve regurgitation   . Arthritis     osteo - everywhere  . Graves disease   . GERD (gastroesophageal reflux disease)   . Headache     sinus  . Anxiety     agoraphobia  . Sleep apnea     supposed to use CPAP - no machine  . Chest pain     related to thyroid issues  . Hereditary pancreatitis   . Wears contact lenses     Surgical History: Past Surgical History  Procedure Laterality Date  . Abdominal hysterectomy    . Appendectomy    . Image guided sinus surgery Right 05/16/2015    Procedure: IMAGE GUIDED SINUS SURGERY,  RIGHT ENDOSCOPIC MAXILLARY ANTROSTOMY, RIGHT ENDOSCOPIC ANTERIOR ETHMOIDECTOMY AND RIGHT ENDOSCOPIC FRONTAL RECESS EXPLORATION;  Surgeon: Clyde Canterbury, MD;  Location: [facility];  Service: ENT;  Laterality: Right;  GAVE DISK TO CE CE  . Polypectomy    . Cholecystectomy      Home Medications:    Medication List       This list is accurate as of: 10/06/15  4:36 PM.  Always use your most recent med list.               aspirin EC 81 MG tablet  Take by mouth.     cholecalciferol 1000 units tablet  Commonly known as:  VITAMIN D  Take 3,000 Units by mouth daily.     fluticasone 50 MCG/ACT nasal spray  Commonly known as:  FLONASE  Place 1-2 sprays into both nostrils daily.     hydrochlorothiazide 12.5 MG capsule  Commonly known as:  MICROZIDE  Take 12.5 mg by mouth daily. lunchtime     levothyroxine 25 MCG tablet  Commonly known as:  SYNTHROID, LEVOTHROID  Take 25 mcg by mouth as needed.     losartan-hydrochlorothiazide 50-12.5 MG tablet  Commonly known as:  HYZAAR     nitroGLYCERIN 0.4 MG SL tablet  Commonly known as:  NITROSTAT  Place 0.4 mg under the tongue every 5 (five) minutes as needed for chest pain. Reported on 10/06/2015  omeprazole 40 MG capsule  Commonly known as:  PRILOSEC  Take 40 mg by mouth daily. AM     simvastatin 10 MG tablet  Commonly known as:  ZOCOR  Take 10 mg by mouth daily. lunchtime     venlafaxine XR 37.5 MG 24 hr capsule  Commonly known as:  EFFEXOR-XR  Take 37.5 mg by mouth daily with breakfast.        Allergies:  Allergies  Allergen Reactions  . Contrast Media [Iodinated Diagnostic Agents] Hives and Nausea Only    Family History: Family History  Problem Relation Age of Onset  . Stroke Father   . Pancreatic cancer Mother   . Stroke Mother   . Hematuria Daughter     Social History:  reports that she quit smoking about 9 months ago. She does not have any smokeless tobacco history on file. She reports that she does  not drink alcohol or use illicit drugs.  ROS: UROLOGY Frequent Urination?: No Hard to postpone urination?: Yes Burning/pain with urination?: No Get up at night to urinate?: Yes Leakage of urine?: Yes Urine stream starts and stops?: No Trouble starting stream?: No Do you have to strain to urinate?: No Blood in urine?: Yes Urinary tract infection?: No Sexually transmitted disease?: No Injury to kidneys or bladder?: No Painful intercourse?: No Weak stream?: Yes Currently pregnant?: No Vaginal bleeding?: No Last menstrual period?: n  Gastrointestinal Nausea?: Yes Vomiting?: No Indigestion/heartburn?: Yes Diarrhea?: No Constipation?: No  Constitutional Fever: No Night sweats?: Yes Weight loss?: No Fatigue?: Yes  Skin Skin rash/lesions?: No Itching?: No  Eyes Blurred vision?: Yes Double vision?: No  Ears/Nose/Throat Sore throat?: No Sinus problems?: Yes  Hematologic/Lymphatic Swollen glands?: Yes Easy bruising?: Yes  Cardiovascular Leg swelling?: No Chest pain?: Yes  Respiratory Cough?: No Shortness of breath?: No  Endocrine Excessive thirst?: Yes  Musculoskeletal Back pain?: Yes Joint pain?: Yes  Neurological Headaches?: Yes Dizziness?: Yes  Psychologic Depression?: No Anxiety?: Yes  Physical Exam: BP 147/77 mmHg  Pulse 73  Resp 16  Ht 5\' 10"  (1.778 m)  Wt 99.428 kg (219 lb 3.2 oz)  BMI 31.45 kg/m2  Constitutional:  Alert and oriented, No acute distress. HEENT: [place] AT, moist mucus membranes.  Trachea midline, no masses. Cardiovascular: No clubbing, cyanosis, or edema. Respiratory: Normal respiratory effort, no increased work of breathing. GI: Abdomen is soft, nontender, nondistended, no abdominal masses GU: No CVA tenderness. The patient had grade 2 hypermobility the bladder neck and a moderately severe positive cough test. She grade 1 cystocele or rectocele. She had mucosal folds likely from previous surgery but in my opinion does not  have a diverticulum Skin: No rashes, bruises or suspicious lesions. Lymph: No cervical or inguinal adenopathy. Neurologic: Grossly intact, no focal deficits, moving all 4 extremities. Psychiatric: Normal mood and affect.  Laboratory Data: Lab Results  Component Value Date   WBC 6.8 12/15/2014   HGB 13.1 12/15/2014   HCT 37.9 12/15/2014   MCV 91.1 12/15/2014   PLT 363 12/15/2014    Lab Results  Component Value Date   CREATININE 0.66 12/15/2014    No results found for: PSA  No results found for: TESTOSTERONE  No results found for: HGBA1C  Urinalysis    Component Value Date/Time   COLORURINE Yellow 04/18/2014 1703   COLORURINE YELLOW 06/17/2009 2056   APPEARANCEUR Hazy 04/18/2014 [place] 06/17/2009 2056   LABSPEC 1.017 04/18/2014 1703   LABSPEC 1.021 06/17/2009 2056   PHURINE 7.0 04/18/2014 1703  PHURINE 8.0 06/17/2009 2056   GLUCOSEU Negative 04/18/2014 1703   GLUCOSEU NEGATIVE 06/17/2009 2056   HGBUR 1+ 04/18/2014 1703   HGBUR NEGATIVE 06/17/2009 2056   BILIRUBINUR Negative 04/18/2014 [place] 06/17/2009 2056   KETONESUR Negative 04/18/2014 [place] 06/17/2009 2056   PROTEINUR Negative 04/18/2014 [place] 06/17/2009 2056   UROBILINOGEN 1.0 06/17/2009 2056   NITRITE Negative 04/18/2014 1703   NITRITE NEGATIVE 06/17/2009 2056   LEUKOCYTESUR Negative 04/18/2014 1703   LEUKOCYTESUR TRACE* 06/17/2009 2056    Pertinent Imaging: None  Assessment & Plan:  The patient has mixed stress urge incontinence. She's had a previous abdominal bladder suspension. She is originally from [place]. The role of urodynamics test. She has mild frequency and nocturia as well.  The urodynamics were ordered. Based upon her pelvic examination I would not be surprised if she would need surgery to reach her treatment goal. I would perform cystoscopy prior  1. Urinary incontinence, unspecified incontinence type 2.  Urinary frequency 3. Nighttime frequency - Urinalysis, Complete    Reece Packer, MD  [facility] [address], [place], [place] [address] [phone]

## 2015-10-07 LAB — URINALYSIS, COMPLETE
Bilirubin, UA: NEGATIVE
Glucose, UA: NEGATIVE
Ketones, UA: NEGATIVE
Leukocytes, UA: NEGATIVE
NITRITE UA: NEGATIVE
PROTEIN UA: NEGATIVE
SPEC GRAV UA: 1.02 (ref 1.005–1.030)
UUROB: 0.2 mg/dL (ref 0.2–1.0)
pH, UA: 5.5 (ref 5.0–7.5)

## 2015-10-07 LAB — MICROSCOPIC EXAMINATION
BACTERIA UA: NONE SEEN
Epithelial Cells (non renal): >10 /hpf — AB (ref 0–10)
RENAL EPITHEL UA: NONE SEEN /HPF

## 2015-10-11 DIAGNOSIS — F329 Major depressive disorder, single episode, unspecified: Secondary | ICD-10-CM | POA: Insufficient documentation

## 2015-10-11 DIAGNOSIS — F419 Anxiety disorder, unspecified: Secondary | ICD-10-CM

## 2015-11-03 ENCOUNTER — Ambulatory Visit (INDEPENDENT_AMBULATORY_CARE_PROVIDER_SITE_OTHER): Admitting: Urology

## 2015-11-03 ENCOUNTER — Encounter: Payer: Self-pay | Admitting: Urology

## 2015-11-03 VITALS — BP 110/72 | HR 75 | Ht 70.0 in | Wt 218.9 lb

## 2015-11-03 DIAGNOSIS — N393 Stress incontinence (female) (male): Secondary | ICD-10-CM | POA: Diagnosis not present

## 2015-11-03 DIAGNOSIS — R32 Unspecified urinary incontinence: Secondary | ICD-10-CM

## 2015-11-03 NOTE — Progress Notes (Signed)
11/03/2015 3:26 PM   Humphrey Rolls 02/07/1966 [id number]  Referring provider: Glendon Axe, MD [facility] [place], [place] [address]  Chief Complaint  Patient presents with  . Follow-up    urodynamic    HPI: The patient is a 50 year old woman who in 1998 had an abdominal hysterectomy with bladder repair.  She did well for a number years but now leaks with coughing sneezing. She leaks if her bladder is full and she bends. She has urge incontinence above the stress component is more significant. She says a new symptoms his urinary incontinence not associated with awareness removing a small amount of urine. She wears 2-3 pads a day sometimes damp and other times very wet  She voids every 1-2 arch and the day gets up once or twice at night. She has no neurologic or risk factors or symptoms. She's had a hysterectomy. Her bowel movements alternate between diarrhea and constipation. She does not have chronic cystitis  During the patient's last visit she had grade 2 hypermobility the bladder neck and a significant cough test. I did not think she had a diverticulum.  On urodynamics she did not void was catheterized her 20 mL. Mask capacity was 412 mL. Her bladder was unstable reaching pressures of 5 cm water. She did not leak. At 200 mL she had mild to moderate leakage of 45 cm water. A 300 mL she had a moderate leakage at 39 cm water. During vault area 40 she voided 412 mL with a maximal flow 14 mils per second. Mask morning pressure was 11 cm water. EMG activity did not decrease during voiding. No contrast dye was utilized. Her voluntary contractions were not well sustained. The details of urine after signed dictated on the urodynamic sheet she did have voiding in the lab setting  PMH: Past Medical History  Diagnosis Date  . PONV (postoperative nausea and vomiting)   . Hypertension   . Tricuspid valve regurgitation   . Mitral valve regurgitation   .  Arthritis     osteo - everywhere  . Graves disease   . GERD (gastroesophageal reflux disease)   . Headache     sinus  . Anxiety     agoraphobia  . Sleep apnea     supposed to use CPAP - no machine  . Chest pain     related to thyroid issues  . Hereditary pancreatitis   . Wears contact lenses     Surgical History: Past Surgical History  Procedure Laterality Date  . Abdominal hysterectomy    . Appendectomy    . Image guided sinus surgery Right 05/16/2015    Procedure: IMAGE GUIDED SINUS SURGERY, RIGHT ENDOSCOPIC MAXILLARY ANTROSTOMY, RIGHT ENDOSCOPIC ANTERIOR ETHMOIDECTOMY AND RIGHT ENDOSCOPIC FRONTAL RECESS EXPLORATION;  Surgeon: Clyde Canterbury, MD;  Location: [facility];  Service: ENT;  Laterality: Right;  GAVE DISK TO CE CE  . Polypectomy    . Cholecystectomy      Home Medications:    Medication List       This list is accurate as of: 11/03/15  3:26 PM.  Always use your most recent med list.               aspirin EC 81 MG tablet  Take by mouth.     cholecalciferol 1000 units tablet  Commonly known as:  VITAMIN D  Take 3,000 Units by mouth daily.     fluticasone 50 MCG/ACT nasal spray  Commonly known as:  FLONASE  Place 1-2 sprays into both nostrils daily.     hydrochlorothiazide 12.5 MG capsule  Commonly known as:  MICROZIDE  Take 12.5 mg by mouth daily. Reported on 11/03/2015     levothyroxine 25 MCG tablet  Commonly known as:  SYNTHROID, LEVOTHROID  Take 25 mcg by mouth as needed.     losartan-hydrochlorothiazide 50-12.5 MG tablet  Commonly known as:  HYZAAR     nitroGLYCERIN 0.4 MG SL tablet  Commonly known as:  NITROSTAT  Place 0.4 mg under the tongue every 5 (five) minutes as needed for chest pain. Reported on 11/03/2015     omeprazole 40 MG capsule  Commonly known as:  PRILOSEC  Take 40 mg by mouth daily. AM     simvastatin 10 MG tablet  Commonly known as:  ZOCOR  Take 10 mg by mouth daily. lunchtime     venlafaxine XR 37.5 MG 24 hr  capsule  Commonly known as:  EFFEXOR-XR  Take 37.5 mg by mouth daily with breakfast.        Allergies:  Allergies  Allergen Reactions  . Contrast Media [Iodinated Diagnostic Agents] Hives and Nausea Only    Family History: Family History  Problem Relation Age of Onset  . Stroke Father   . Pancreatic cancer Mother   . Stroke Mother   . Hematuria Daughter     Social History:  reports that she quit smoking about 10 months ago. She does not have any smokeless tobacco history on file. She reports that she does not drink alcohol or use illicit drugs.  ROS:                                        Physical Exam: BP 110/72 mmHg  Pulse 75  Ht 5\' 10"  (1.778 m)  Wt 218 lb 14.4 oz (99.292 kg)  BMI 31.41 kg/m2    Laboratory Data: Lab Results  Component Value Date   WBC 6.8 12/15/2014   HGB 13.1 12/15/2014   HCT 37.9 12/15/2014   MCV 91.1 12/15/2014   PLT 363 12/15/2014    Lab Results  Component Value Date   CREATININE 0.66 12/15/2014    No results found for: PSA  No results found for: TESTOSTERONE  No results found for: HGBA1C  Urinalysis    Component Value Date/Time   COLORURINE Yellow 04/18/2014 1703   COLORURINE YELLOW 06/17/2009 2056   APPEARANCEUR Hazy 04/18/2014 [place] 06/17/2009 2056   LABSPEC 1.017 04/18/2014 1703   LABSPEC 1.021 06/17/2009 2056   PHURINE 7.0 04/18/2014 1703   PHURINE 8.0 06/17/2009 2056   GLUCOSEU Negative 10/06/2015 1551   GLUCOSEU Negative 04/18/2014 1703   HGBUR 1+ 04/18/2014 1703   HGBUR NEGATIVE 06/17/2009 2056   BILIRUBINUR Negative 10/06/2015 1551   BILIRUBINUR Negative 04/18/2014 1703   BILIRUBINUR NEGATIVE 06/17/2009 2056   KETONESUR Negative 04/18/2014 [place] 06/17/2009 2056   PROTEINUR Negative 04/18/2014 [place] 06/17/2009 2056   UROBILINOGEN 1.0 06/17/2009 2056   NITRITE Negative 10/06/2015 1551   NITRITE Negative 04/18/2014 1703    NITRITE NEGATIVE 06/17/2009 2056   LEUKOCYTESUR Negative 10/06/2015 1551   LEUKOCYTESUR Negative 04/18/2014 1703   LEUKOCYTESUR TRACE* 06/17/2009 2056    Pertinent Imaging: none  Assessment & Plan:  A proximally 60% of the patient's problem is her outlet that she has an overactive bladder component.  Her leakage not associated with awareness could be from either one. I gave her Vesicare 5 mg samples and prescription and a beta 3 agonist 25 mg samples and prescription. Reassess in 2 months and review the pros and cons of surgery if she does not reacher treatment goal   Reece Packer, MD  [facility] [address], [place], [place] [address] [phone]

## 2016-01-19 ENCOUNTER — Ambulatory Visit

## 2016-04-16 ENCOUNTER — Emergency Department (HOSPITAL_COMMUNITY)

## 2016-04-16 ENCOUNTER — Emergency Department (HOSPITAL_COMMUNITY)
Admission: EM | Admit: 2016-04-16 | Discharge: 2016-04-16 | Disposition: A | Discharge status: Home / Self Care | Attending: Emergency Medicine | Admitting: Emergency Medicine

## 2016-04-16 ENCOUNTER — Encounter (HOSPITAL_COMMUNITY): Payer: Self-pay

## 2016-04-16 DIAGNOSIS — Z87891 Personal history of nicotine dependence: Secondary | ICD-10-CM | POA: Insufficient documentation

## 2016-04-16 DIAGNOSIS — Z5321 Procedure and treatment not carried out due to patient leaving prior to being seen by health care provider: Secondary | ICD-10-CM | POA: Insufficient documentation

## 2016-04-16 DIAGNOSIS — R079 Chest pain, unspecified: Secondary | ICD-10-CM | POA: Diagnosis present

## 2016-04-16 DIAGNOSIS — Z7982 Long term (current) use of aspirin: Secondary | ICD-10-CM | POA: Insufficient documentation

## 2016-04-16 DIAGNOSIS — I10 Essential (primary) hypertension: Secondary | ICD-10-CM | POA: Insufficient documentation

## 2016-04-16 LAB — BASIC METABOLIC PANEL
ANION GAP: 9 (ref 5–15)
BUN: 11 mg/dL (ref 6–20)
CALCIUM: 9.4 mg/dL (ref 8.9–10.3)
CO2: 25 mmol/L (ref 22–32)
Chloride: 103 mmol/L (ref 101–111)
Creatinine, Ser: 0.87 mg/dL (ref 0.44–1.00)
GFR calc Af Amer: >60 mL/min (ref >60)
GLUCOSE: 127 mg/dL — AB (ref 65–99)
POTASSIUM: 3.4 mmol/L — AB (ref 3.5–5.1)
SODIUM: 137 mmol/L (ref 135–145)

## 2016-04-16 LAB — CBC
HCT: 44.5 % (ref 36.0–46.0)
HEMOGLOBIN: 14.8 g/dL (ref 12.0–15.0)
MCH: 30.5 pg (ref 26.0–34.0)
MCHC: 33.3 g/dL (ref 30.0–36.0)
MCV: 91.8 fL (ref 78.0–100.0)
Platelets: 274 10*3/uL (ref 150–400)
RBC: 4.85 MIL/uL (ref 3.87–5.11)
RDW: 12.8 % (ref 11.5–15.5)
WBC: 7.7 10*3/uL (ref 4.0–10.5)

## 2016-04-16 LAB — I-STAT TROPONIN, ED: Troponin i, poc: 0 ng/mL (ref 0.00–0.08)

## 2016-04-16 NOTE — ED Triage Notes (Signed)
Onset today 11am pt started having shortness of breath, chest pain, lightheadedness.  Pt went to [facility] u/c and BP was noted to be high.

## 2016-04-16 NOTE — ED Notes (Signed)
Pt is leaving. Advised her she needed to stay although she says she will not wait.  Seen walking out of ED.

## 2016-05-27 ENCOUNTER — Other Ambulatory Visit: Payer: Self-pay | Admitting: Gastroenterology

## 2016-05-27 DIAGNOSIS — R748 Abnormal levels of other serum enzymes: Secondary | ICD-10-CM

## 2016-05-27 DIAGNOSIS — R109 Unspecified abdominal pain: Secondary | ICD-10-CM

## 2016-06-05 ENCOUNTER — Ambulatory Visit
Admission: RE | Admit: 2016-06-05 | Discharge: 2016-06-05 | Disposition: A | Discharge status: Home / Self Care | Source: Ambulatory Visit | Attending: Gastroenterology | Admitting: Gastroenterology

## 2016-06-05 DIAGNOSIS — R748 Abnormal levels of other serum enzymes: Secondary | ICD-10-CM | POA: Diagnosis present

## 2016-06-05 DIAGNOSIS — R109 Unspecified abdominal pain: Secondary | ICD-10-CM | POA: Insufficient documentation

## 2016-06-05 MED ORDER — IOPAMIDOL (ISOVUE-300) INJECTION 61%
100.0000 mL | Freq: Once | INTRAVENOUS | Status: AC | PRN
  Administered 2016-06-05: 100 mL via INTRAVENOUS

## 2017-06-04 ENCOUNTER — Other Ambulatory Visit: Payer: Self-pay | Admitting: Obstetrics & Gynecology

## 2017-06-04 MED ORDER — CLOBETASOL PROPIONATE 0.05 % EX CREA: 1.0000 "application " | TOPICAL_CREAM | Freq: Two times a day (BID) | CUTANEOUS | 1 refills | Status: DC

## 2017-07-01 ENCOUNTER — Ambulatory Visit (INDEPENDENT_AMBULATORY_CARE_PROVIDER_SITE_OTHER): Admitting: Obstetrics & Gynecology

## 2017-07-01 ENCOUNTER — Encounter: Payer: Self-pay | Admitting: Obstetrics & Gynecology

## 2017-07-01 VITALS — BP 128/80 | HR 95 | Ht 70.0 in | Wt 214.0 lb

## 2017-07-01 DIAGNOSIS — Z1239 Encounter for other screening for malignant neoplasm of breast: Secondary | ICD-10-CM

## 2017-07-01 DIAGNOSIS — Z1211 Encounter for screening for malignant neoplasm of colon: Secondary | ICD-10-CM

## 2017-07-01 DIAGNOSIS — Z0000 Encounter for general adult medical examination without abnormal findings: Secondary | ICD-10-CM

## 2017-07-01 DIAGNOSIS — L900 Lichen sclerosus et atrophicus: Secondary | ICD-10-CM | POA: Diagnosis not present

## 2017-07-01 DIAGNOSIS — Z01419 Encounter for gynecological examination (general) (routine) without abnormal findings: Secondary | ICD-10-CM

## 2017-07-01 DIAGNOSIS — Z1231 Encounter for screening mammogram for malignant neoplasm of breast: Secondary | ICD-10-CM

## 2017-07-01 MED ORDER — CLOBETASOL PROPIONATE 0.05 % EX CREA: 1.0000 "application " | TOPICAL_CREAM | Freq: Two times a day (BID) | CUTANEOUS | 3 refills | Status: DC

## 2017-07-01 NOTE — Patient Instructions (Addendum)
PAP every 5 years Mammogram every year    Call [phone] to schedule at [facility] Colonoscopy every 10 years need to schedule w Dr Doug Sou yearly (with PCP)

## 2017-07-01 NOTE — Progress Notes (Signed)
HPI:      [name] is a 51 y.o. [phone] who LMP was in the past, she presents today for her annual examination.  The patient has no complaints today. The patient is sexually active. Herlast pap: approximate date 2017 and was normal and last mammogram: was normal.  The patient does perform self breast exams.  There is no notable family history of breast or ovarian cancer in her family. The patient is not taking hormone replacement therapy. Patient denies post-menopausal vaginal bleeding.   The patient has regular exercise: yes. The patient denies current symptoms of depression.    Lichen Sclerosus flares 2-3 / year, Clobetasol helps OAB at times, no meds.  One med from urologist worked but too expensive. Prior hyst  GYN Hx: Last Colonoscopy:10 years ago. Normal.  Last DEXA: never ago.    PMHx: Past Medical History:  Diagnosis Date  . Anxiety    agoraphobia  . Arthritis    osteo - everywhere  . Chest pain    related to thyroid issues  . GERD (gastroesophageal reflux disease)   . Graves disease   . Graves disease   . Headache    sinus  . Hereditary pancreatitis   . Hypertension   . Hypothyroid   . Mitral valve regurgitation   . PONV (postoperative nausea and vomiting)   . Sleep apnea    supposed to use CPAP - no machine  . Tricuspid valve regurgitation   . Wears contact lenses    Past Surgical History:  Procedure Laterality Date  . ABDOMINAL HYSTERECTOMY    . APPENDECTOMY    . CHOLECYSTECTOMY    . POLYPECTOMY     Family History  Problem Relation Age of Onset  . Stroke Father   . Pancreatic cancer Mother   . Stroke Mother   . Hematuria Daughter    Social History   Tobacco Use  . Smoking status: Former Smoker    Packs/day: 1.00    Years: 20.00    Pack years: 20.00    Last attempt to quit: 12/09/2014    Years since quitting: 2.5  . Smokeless tobacco: Never Used  Substance Use Topics  . Alcohol use: No  . Drug use: No    Current Outpatient  Medications:  .  aspirin EC 81 MG tablet, Take by mouth., Disp: , Rfl:  .  cholecalciferol (VITAMIN D) 1000 UNITS tablet, Take 3,000 Units by mouth daily., Disp: , Rfl:  .  clobetasol cream (TEMOVATE) 5.00 %, Apply 1 application 2 (two) times daily topically., Disp: 30 g, Rfl: 3 .  fluticasone (FLONASE) 50 MCG/ACT nasal spray, Place 1-2 sprays into both nostrils daily., Disp: , Rfl:  .  hydrochlorothiazide (MICROZIDE) 12.5 MG capsule, Take 12.5 mg by mouth daily. Reported on 11/03/2015, Disp: , Rfl:  .  levothyroxine (SYNTHROID, LEVOTHROID) 25 MCG tablet, Take 25 mcg by mouth as needed., Disp: , Rfl:  .  losartan-hydrochlorothiazide (HYZAAR) 50-12.5 MG tablet, , Disp: , Rfl:  .  nitroGLYCERIN (NITROSTAT) 0.4 MG SL tablet, Place 0.4 mg under the tongue every 5 (five) minutes as needed for chest pain. Reported on 11/03/2015, Disp: , Rfl:  .  omeprazole (PRILOSEC) 40 MG capsule, Take 40 mg by mouth daily. AM, Disp: , Rfl:  .  simvastatin (ZOCOR) 10 MG tablet, Take 10 mg by mouth daily. lunchtime, Disp: , Rfl:  .  venlafaxine XR (EFFEXOR-XR) 37.5 MG 24 hr capsule, Take 37.5 mg by mouth daily with breakfast., Disp: ,  Rfl:  Allergies: Contrast media [iodinated diagnostic agents]  Review of Systems  Constitutional: Negative for chills, fever and malaise/fatigue.  HENT: Negative for congestion, sinus pain and sore throat.   Eyes: Negative for blurred vision and pain.  Respiratory: Negative for cough and wheezing.   Cardiovascular: Negative for chest pain and leg swelling.  Gastrointestinal: Negative for abdominal pain, constipation, diarrhea, heartburn, nausea and vomiting.  Genitourinary: Negative for dysuria, frequency, hematuria and urgency.  Musculoskeletal: Negative for back pain, joint pain, myalgias and neck pain.  Skin: Negative for itching and rash.  Neurological: Negative for dizziness, tremors and weakness.  Endo/Heme/Allergies: Does not bruise/bleed easily.  Psychiatric/Behavioral:  Negative for depression. The patient is not nervous/anxious and does not have insomnia.     Objective: BP 128/80   Pulse 95   Ht 5\' 10"  (1.778 m)   Wt 214 lb (97.1 kg)   BMI 30.71 kg/m   Filed Weights   07/01/17 0949  Weight: 214 lb (97.1 kg)   Body mass index is 30.71 kg/m. Physical Exam  Constitutional: She is oriented to person, place, and time. She appears well-developed and well-nourished. No distress.  Genitourinary: Rectum normal and vagina normal. Pelvic exam was performed with patient supine. There is no rash or lesion on the right labia. There is no rash or lesion on the left labia. Vagina exhibits no lesion. No bleeding in the vagina. Right adnexum does not display mass and does not display tenderness. Left adnexum does not display mass and does not display tenderness.  Genitourinary Comments: Absent Uterus Absent cervix Vaginal cuff well healed No lesions externally Gr 2 Cystocele  HENT:  Head: Normocephalic and atraumatic. Head is without laceration.  Right Ear: Hearing normal.  Left Ear: Hearing normal.  Nose: No epistaxis.  No foreign bodies.  Mouth/Throat: Uvula is midline, oropharynx is clear and moist and mucous membranes are normal.  Eyes: Pupils are equal, round, and reactive to light.  Neck: Normal range of motion. Neck supple. No thyromegaly present.  Cardiovascular: Normal rate and regular rhythm. Exam reveals no gallop and no friction rub.  No murmur heard. Pulmonary/Chest: Effort normal and breath sounds normal. No respiratory distress. She has no wheezes. Right breast exhibits no mass, no skin change and no tenderness. Left breast exhibits no mass, no skin change and no tenderness.  Abdominal: Soft. Bowel sounds are normal. She exhibits no distension. There is no tenderness. There is no rebound.  Musculoskeletal: Normal range of motion.  Neurological: She is alert and oriented to person, place, and time. No cranial nerve deficit.  Skin: Skin is warm and  dry.  Psychiatric: She has a normal mood and affect. Judgment normal.  Vitals reviewed.   Assessment: Annual Exam 1. Annual physical exam   2. Screening for breast cancer   3. Screen for colon cancer   4. Lichen sclerosus     Plan:            1.  Cervical Screening-  Pap smear schedule reviewed with patient  2. Breast screening- Exam annually and mammogram scheduled  3. Colonoscopy every 10 years, Hemoccult testing after age 55  4. Labs managed by PCP  5. Counseling for hormonal therapy: none, no change in therapy today  6. LS- cont PRN use of Clobetasol  7. Urology for OAB control if worsens     F/U  Return in about 1 year (around 07/01/2018) for Annual.  Barnett Applebaum, MD, [facility], [facility] 07/01/2017  10:16  AM

## 2017-07-09 LAB — FECAL OCCULT BLOOD, IMMUNOCHEMICAL: FECAL OCCULT BLD: NEGATIVE

## 2017-09-30 ENCOUNTER — Other Ambulatory Visit: Payer: Self-pay | Admitting: Gastroenterology

## 2017-09-30 DIAGNOSIS — R748 Abnormal levels of other serum enzymes: Secondary | ICD-10-CM

## 2017-09-30 DIAGNOSIS — R1013 Epigastric pain: Secondary | ICD-10-CM

## 2017-10-08 ENCOUNTER — Other Ambulatory Visit: Payer: Self-pay | Admitting: Gastroenterology

## 2017-10-08 DIAGNOSIS — R748 Abnormal levels of other serum enzymes: Secondary | ICD-10-CM

## 2017-10-09 ENCOUNTER — Ambulatory Visit
Admission: RE | Admit: 2017-10-09 | Discharge: 2017-10-09 | Disposition: A | Discharge status: Home / Self Care | Source: Ambulatory Visit | Attending: Gastroenterology | Admitting: Gastroenterology

## 2017-10-09 DIAGNOSIS — Z9049 Acquired absence of other specified parts of digestive tract: Secondary | ICD-10-CM | POA: Insufficient documentation

## 2017-10-09 DIAGNOSIS — R748 Abnormal levels of other serum enzymes: Secondary | ICD-10-CM | POA: Diagnosis not present

## 2017-10-09 DIAGNOSIS — K760 Fatty (change of) liver, not elsewhere classified: Secondary | ICD-10-CM | POA: Diagnosis not present

## 2017-10-09 DIAGNOSIS — R1013 Epigastric pain: Secondary | ICD-10-CM | POA: Diagnosis present

## 2017-10-09 DIAGNOSIS — I700 Atherosclerosis of aorta: Secondary | ICD-10-CM | POA: Insufficient documentation

## 2017-10-09 MED ORDER — GADOBENATE DIMEGLUMINE 529 MG/ML IV SOLN
20.0000 mL | Freq: Once | INTRAVENOUS | Status: AC | PRN
  Administered 2017-10-09: 20 mL via INTRAVENOUS

## 2018-02-09 ENCOUNTER — Ambulatory Visit: Admitting: Obstetrics & Gynecology

## 2018-03-03 NOTE — Discharge Instructions (Signed)

## 2018-03-04 ENCOUNTER — Encounter: Payer: Self-pay | Admitting: *Deleted

## 2018-03-04 ENCOUNTER — Other Ambulatory Visit: Payer: Self-pay

## 2018-03-09 ENCOUNTER — Ambulatory Visit
Admission: RE | Admit: 2018-03-09 | Discharge: 2018-03-09 | Disposition: A | Discharge status: Home / Self Care | Source: Ambulatory Visit | Attending: Ophthalmology | Admitting: Ophthalmology

## 2018-03-09 ENCOUNTER — Ambulatory Visit: Admitting: Anesthesiology

## 2018-03-09 ENCOUNTER — Encounter
Admission: RE | Disposition: A | Discharge status: Home / Self Care | Payer: Self-pay | Source: Ambulatory Visit | Attending: Ophthalmology

## 2018-03-09 DIAGNOSIS — I499 Cardiac arrhythmia, unspecified: Secondary | ICD-10-CM | POA: Diagnosis not present

## 2018-03-09 DIAGNOSIS — E7800 Pure hypercholesterolemia, unspecified: Secondary | ICD-10-CM | POA: Insufficient documentation

## 2018-03-09 DIAGNOSIS — K5790 Diverticulosis of intestine, part unspecified, without perforation or abscess without bleeding: Secondary | ICD-10-CM | POA: Insufficient documentation

## 2018-03-09 DIAGNOSIS — K219 Gastro-esophageal reflux disease without esophagitis: Secondary | ICD-10-CM | POA: Diagnosis not present

## 2018-03-09 DIAGNOSIS — Z9049 Acquired absence of other specified parts of digestive tract: Secondary | ICD-10-CM | POA: Insufficient documentation

## 2018-03-09 DIAGNOSIS — I10 Essential (primary) hypertension: Secondary | ICD-10-CM | POA: Insufficient documentation

## 2018-03-09 DIAGNOSIS — F17200 Nicotine dependence, unspecified, uncomplicated: Secondary | ICD-10-CM | POA: Diagnosis not present

## 2018-03-09 DIAGNOSIS — Z91041 Radiographic dye allergy status: Secondary | ICD-10-CM | POA: Diagnosis not present

## 2018-03-09 DIAGNOSIS — Z90710 Acquired absence of both cervix and uterus: Secondary | ICD-10-CM | POA: Diagnosis not present

## 2018-03-09 DIAGNOSIS — M1990 Unspecified osteoarthritis, unspecified site: Secondary | ICD-10-CM | POA: Insufficient documentation

## 2018-03-09 DIAGNOSIS — E0500 Thyrotoxicosis with diffuse goiter without thyrotoxic crisis or storm: Secondary | ICD-10-CM | POA: Diagnosis not present

## 2018-03-09 DIAGNOSIS — H2511 Age-related nuclear cataract, right eye: Secondary | ICD-10-CM | POA: Diagnosis present

## 2018-03-09 DIAGNOSIS — Z8711 Personal history of peptic ulcer disease: Secondary | ICD-10-CM | POA: Insufficient documentation

## 2018-03-09 DIAGNOSIS — K859 Acute pancreatitis without necrosis or infection, unspecified: Secondary | ICD-10-CM | POA: Diagnosis not present

## 2018-03-09 DIAGNOSIS — R05 Cough: Secondary | ICD-10-CM | POA: Diagnosis not present

## 2018-03-09 DIAGNOSIS — F419 Anxiety disorder, unspecified: Secondary | ICD-10-CM | POA: Insufficient documentation

## 2018-03-09 DIAGNOSIS — G4730 Sleep apnea, unspecified: Secondary | ICD-10-CM | POA: Insufficient documentation

## 2018-03-09 DIAGNOSIS — K589 Irritable bowel syndrome without diarrhea: Secondary | ICD-10-CM | POA: Diagnosis not present

## 2018-03-09 HISTORY — PX: CATARACT EXTRACTION W/PHACO: SHX586

## 2018-03-09 SURGERY — PHACOEMULSIFICATION, CATARACT, WITH IOL INSERTION
Anesthesia: Monitor Anesthesia Care | Site: Eye | Laterality: Right | Wound class: Clean

## 2018-03-09 MED ORDER — PHENYLEPHRINE HCL 10 % OP SOLN
1.0000 [drp] | OPHTHALMIC | Status: DC | PRN
  Administered 2018-03-09 (×4): 1 [drp] via OPHTHALMIC

## 2018-03-09 MED ORDER — FENTANYL CITRATE (PF) 100 MCG/2ML IJ SOLN
INTRAMUSCULAR | Status: DC | PRN
  Administered 2018-03-09 (×2): 50 ug via INTRAVENOUS

## 2018-03-09 MED ORDER — ACETAMINOPHEN 160 MG/5ML PO SOLN: 325.0000 mg | Freq: Once | ORAL | Status: DC

## 2018-03-09 MED ORDER — CYCLOPENTOLATE HCL 2 % OP SOLN
1.0000 [drp] | OPHTHALMIC | Status: DC | PRN
  Administered 2018-03-09 (×4): 1 [drp] via OPHTHALMIC

## 2018-03-09 MED ORDER — ACETAMINOPHEN 325 MG PO TABS: 325.0000 mg | ORAL_TABLET | Freq: Once | ORAL | Status: DC

## 2018-03-09 MED ORDER — TETRACAINE HCL 0.5 % OP SOLN
1.0000 [drp] | OPHTHALMIC | Status: DC | PRN
  Administered 2018-03-09 (×2): 1 [drp] via OPHTHALMIC

## 2018-03-09 MED ORDER — SODIUM HYALURONATE 23 MG/ML IO SOLN
INTRAOCULAR | Status: DC | PRN
  Administered 2018-03-09: 0.6 mL via INTRAOCULAR

## 2018-03-09 MED ORDER — EPINEPHRINE PF 1 MG/ML IJ SOLN
INTRAOCULAR | Status: DC | PRN
  Administered 2018-03-09: 64 mL via OPHTHALMIC

## 2018-03-09 MED ORDER — MOXIFLOXACIN HCL 0.5 % OP SOLN
OPHTHALMIC | Status: DC | PRN
  Administered 2018-03-09: 0.2 mL via OPHTHALMIC

## 2018-03-09 MED ORDER — LACTATED RINGERS IV SOLN: INTRAVENOUS | Status: DC

## 2018-03-09 MED ORDER — MIDAZOLAM HCL 2 MG/2ML IJ SOLN
INTRAMUSCULAR | Status: DC | PRN
  Administered 2018-03-09: 2 mg via INTRAVENOUS

## 2018-03-09 MED ORDER — SODIUM HYALURONATE 10 MG/ML IO SOLN
INTRAOCULAR | Status: DC | PRN
  Administered 2018-03-09: 0.55 mL via INTRAOCULAR

## 2018-03-09 MED ORDER — LIDOCAINE HCL (PF) 2 % IJ SOLN
INTRAOCULAR | Status: DC | PRN
  Administered 2018-03-09: 1 mL via INTRAOCULAR

## 2018-03-09 MED ORDER — ONDANSETRON HCL 4 MG/2ML IJ SOLN
4.0000 mg | Freq: Once | INTRAMUSCULAR | Status: AC
  Administered 2018-03-09: 4 mg via INTRAVENOUS

## 2018-03-09 SURGICAL SUPPLY — 16 items
CANNULA ANT/CHMB 27GA (MISCELLANEOUS) ×3 IMPLANT
DISSECTOR HYDRO NUCLEUS 50X22 (MISCELLANEOUS) ×3 IMPLANT
GLOVE BIO SURGEON STRL SZ8 (GLOVE) ×3 IMPLANT
GLOVE SURG LX 7.5 STRW (GLOVE) ×2
GLOVE SURG LX STRL 7.5 STRW (GLOVE) ×1 IMPLANT
GOWN STRL REUS W/ TWL LRG LVL3 (GOWN DISPOSABLE) ×2 IMPLANT
GOWN STRL REUS W/TWL LRG LVL3 (GOWN DISPOSABLE) ×4
LENS IOL TECNIS SYMFONY 6.0 ×3 IMPLANT
MARKER SKIN DUAL TIP RULER LAB (MISCELLANEOUS) ×3 IMPLANT
PACK CATARACT (MISCELLANEOUS) ×3 IMPLANT
PACK DR. KING ARMS (PACKS) ×3 IMPLANT
PACK EYE AFTER SURG (MISCELLANEOUS) ×3 IMPLANT
SYR 3ML LL SCALE MARK (SYRINGE) ×3 IMPLANT
SYR TB 1ML LUER SLIP (SYRINGE) ×3 IMPLANT
WATER STERILE IRR 500ML POUR (IV SOLUTION) ×3 IMPLANT
WIPE NON LINTING 3.25X3.25 (MISCELLANEOUS) ×3 IMPLANT

## 2018-03-09 NOTE — H&P (Signed)
The History and Physical notes are on paper, have been signed, and are to be scanned.   I have examined the patient and there are no changes to the H&P.   Emma Barker 03/09/2018 12:22 PM

## 2018-03-09 NOTE — Anesthesia Postprocedure Evaluation (Signed)
Anesthesia Post Note  Patient: Emma Barker  Procedure(s) Performed: CATARACT EXTRACTION PHACO AND INTRAOCULAR LENS PLACEMENT (IOC) RIGHT IVA TOPICAL SYMFONY LENS (Right Eye)  Patient location during evaluation: PACU Anesthesia Type: MAC Level of consciousness: awake and alert and oriented Pain management: satisfactory to patient Vital Signs Assessment: post-procedure vital signs reviewed and stable Respiratory status: spontaneous breathing, nonlabored ventilation and respiratory function stable Cardiovascular status: blood pressure returned to baseline and stable Postop Assessment: Adequate PO intake and No signs of nausea or vomiting Anesthetic complications: no    Raliegh Ip

## 2018-03-09 NOTE — Anesthesia Preprocedure Evaluation (Signed)
Anesthesia Evaluation  Patient identified by MRN, date of birth, ID band Patient awake    Reviewed: Allergy & Precautions, H&P , NPO status , Patient's Chart, lab work & pertinent test results  History of Anesthesia Complications (+) PONV and history of anesthetic complications  Airway Mallampati: III  TM Distance: >3 FB Neck ROM: full    Dental no notable dental hx.    Pulmonary sleep apnea , Current Smoker,    Pulmonary exam normal breath sounds clear to auscultation       Cardiovascular hypertension, Normal cardiovascular exam Rhythm:regular Rate:Normal     Neuro/Psych Anxiety    GI/Hepatic GERD  ,  Endo/Other  Hypothyroidism   Renal/GU      Musculoskeletal   Abdominal   Peds  Hematology   Anesthesia Other Findings   Reproductive/Obstetrics                             Anesthesia Physical Anesthesia Plan  ASA: III  Anesthesia Plan: MAC   Post-op Pain Management:    Induction:   PONV Risk Score and Plan: 2 and 3 and Treatment may vary due to age or medical condition and Midazolam  Airway Management Planned:   Additional Equipment:   Intra-op Plan:   Post-operative Plan:   Informed Consent: I have reviewed the patients History and Physical, chart, labs and discussed the procedure including the risks, benefits and alternatives for the proposed anesthesia with the patient or authorized representative who has indicated his/her understanding and acceptance.     Plan Discussed with: CRNA  Anesthesia Plan Comments:         Anesthesia Quick Evaluation

## 2018-03-09 NOTE — Op Note (Signed)
OPERATIVE NOTE  Emma Barker [id number] 03/09/2018   PREOPERATIVE DIAGNOSIS:  Nuclear sclerotic cataract right eye.  H25.11   POSTOPERATIVE DIAGNOSIS:    Nuclear sclerotic cataract right eye.     PROCEDURE:  Phacoemusification with posterior chamber intraocular lens placement of the right eye   LENS:   Implant Name Type Inv. Item Serial No. Manufacturer Lot No. LRB No. Used  LENS IOL SYMFONY 6.0 - [id number] Intraocular Lens LENS IOL SYMFONY 6.0 [id number]  Right 1       [id number] +6.0 D   ULTRASOUND TIME: 0 minutes 16 seconds.  CDE 0.65   SURGEON:  Benay Pillow, MD, MPH  ANESTHESIOLOGIST: Anesthesiologist: Ronelle Nigh, MD CRNA: Lind Guest, CRNA   ANESTHESIA:  Topical with tetracaine drops augmented with 1% preservative-free intracameral lidocaine.  ESTIMATED BLOOD LOSS: less than 1 mL.   COMPLICATIONS:  None.   DESCRIPTION OF PROCEDURE:  The patient was identified in the holding room and transported to the operating room and placed in the supine position under the operating microscope.  The right eye was identified as the operative eye and it was prepped and draped in the usual sterile ophthalmic fashion.   A 1.0 millimeter clear-corneal paracentesis was made at the 10:30 position. 0.5 ml of preservative-free 1% lidocaine with epinephrine was injected into the anterior chamber.  The anterior chamber was filled with Healon 5 viscoelastic.  A 2.4 millimeter keratome was used to make a near-clear corneal incision at the 8:00 position.  A curvilinear capsulorrhexis was made with a cystotome and capsulorrhexis forceps.  Balanced salt solution was used to hydrodissect and hydrodelineate the nucleus.   Phacoemulsification was then used in stop and chop fashion to remove the lens nucleus and epinucleus.  The remaining cortex was then removed using the irrigation and aspiration handpiece. Healon was then placed into the capsular bag to distend it for lens placement.  A  lens was then injected into the capsular bag.  The remaining viscoelastic was aspirated. The lens was well centered.   Wounds were hydrated with balanced salt solution.  The anterior chamber was inflated to a physiologic pressure with balanced salt solution.   Intracameral vigamox 0.1 mL undiluted was injected into the eye and a drop placed onto the ocular surface.  No wound leaks were noted.  The patient was taken to the recovery room in stable condition without complications of anesthesia or surgery  Benay Pillow 03/09/2018, 12:59 PM

## 2018-03-09 NOTE — Transfer of Care (Signed)
Immediate Anesthesia Transfer of Care Note  Patient: Emma Barker  Procedure(s) Performed: CATARACT EXTRACTION PHACO AND INTRAOCULAR LENS PLACEMENT (IOC) RIGHT IVA TOPICAL SYMFONY LENS (Right Eye)  Patient Location: PACU  Anesthesia Type: MAC  Level of Consciousness: awake, alert  and patient cooperative  Airway and Oxygen Therapy: Patient Spontanous Breathing and Patient connected to supplemental oxygen  Post-op Assessment: Post-op Vital signs reviewed, Patient's Cardiovascular Status Stable, Respiratory Function Stable, Patent Airway and No signs of Nausea or vomiting  Post-op Vital Signs: Reviewed and stable  Complications: No apparent anesthesia complications

## 2018-03-09 NOTE — Anesthesia Procedure Notes (Signed)
Procedure Name: MAC Date/Time: 03/09/2018 12:41 PM Performed by: Lind Guest, CRNA Pre-anesthesia Checklist: Patient identified, Emergency Drugs available, Suction available, Patient being monitored and Timeout performed Patient Re-evaluated:Patient Re-evaluated prior to induction Oxygen Delivery Method: Nasal cannula

## 2018-03-10 ENCOUNTER — Encounter: Payer: Self-pay | Admitting: Ophthalmology

## 2018-03-16 ENCOUNTER — Encounter: Payer: Self-pay | Admitting: *Deleted

## 2018-03-16 ENCOUNTER — Other Ambulatory Visit: Payer: Self-pay

## 2018-03-25 NOTE — Discharge Instructions (Signed)

## 2018-03-30 ENCOUNTER — Ambulatory Visit: Admitting: Anesthesiology

## 2018-03-30 ENCOUNTER — Encounter
Admission: RE | Disposition: A | Discharge status: Home / Self Care | Payer: Self-pay | Source: Ambulatory Visit | Attending: Ophthalmology

## 2018-03-30 ENCOUNTER — Ambulatory Visit
Admission: RE | Admit: 2018-03-30 | Discharge: 2018-03-30 | Disposition: A | Discharge status: Home / Self Care | Source: Ambulatory Visit | Attending: Ophthalmology | Admitting: Ophthalmology

## 2018-03-30 DIAGNOSIS — Z7982 Long term (current) use of aspirin: Secondary | ICD-10-CM | POA: Diagnosis not present

## 2018-03-30 DIAGNOSIS — H2512 Age-related nuclear cataract, left eye: Secondary | ICD-10-CM | POA: Diagnosis present

## 2018-03-30 DIAGNOSIS — Z79899 Other long term (current) drug therapy: Secondary | ICD-10-CM | POA: Insufficient documentation

## 2018-03-30 DIAGNOSIS — F419 Anxiety disorder, unspecified: Secondary | ICD-10-CM | POA: Diagnosis not present

## 2018-03-30 DIAGNOSIS — G4730 Sleep apnea, unspecified: Secondary | ICD-10-CM | POA: Insufficient documentation

## 2018-03-30 DIAGNOSIS — I10 Essential (primary) hypertension: Secondary | ICD-10-CM | POA: Diagnosis not present

## 2018-03-30 DIAGNOSIS — E0500 Thyrotoxicosis with diffuse goiter without thyrotoxic crisis or storm: Secondary | ICD-10-CM | POA: Diagnosis not present

## 2018-03-30 DIAGNOSIS — K219 Gastro-esophageal reflux disease without esophagitis: Secondary | ICD-10-CM | POA: Insufficient documentation

## 2018-03-30 HISTORY — PX: CATARACT EXTRACTION W/PHACO: SHX586

## 2018-03-30 SURGERY — PHACOEMULSIFICATION, CATARACT, WITH IOL INSERTION
Anesthesia: Monitor Anesthesia Care | Site: Eye | Laterality: Left | Wound class: "Clean "

## 2018-03-30 MED ORDER — MOXIFLOXACIN HCL 0.5 % OP SOLN
OPHTHALMIC | Status: DC | PRN
  Administered 2018-03-30: .1 mL via OPHTHALMIC

## 2018-03-30 MED ORDER — LIDOCAINE HCL (PF) 2 % IJ SOLN
INTRAOCULAR | Status: DC | PRN
  Administered 2018-03-30: 1 mL via INTRAOCULAR

## 2018-03-30 MED ORDER — CYCLOPENTOLATE HCL 2 % OP SOLN
1.0000 [drp] | OPHTHALMIC | Status: DC | PRN
  Administered 2018-03-30 (×3): 1 [drp] via OPHTHALMIC

## 2018-03-30 MED ORDER — PHENYLEPHRINE HCL 10 % OP SOLN
1.0000 [drp] | OPHTHALMIC | Status: DC | PRN
  Administered 2018-03-30 (×3): 1 [drp] via OPHTHALMIC

## 2018-03-30 MED ORDER — TETRACAINE HCL 0.5 % OP SOLN
1.0000 [drp] | OPHTHALMIC | Status: DC | PRN
  Administered 2018-03-30 (×2): 1 [drp] via OPHTHALMIC

## 2018-03-30 MED ORDER — EPINEPHRINE PF 1 MG/ML IJ SOLN
INTRAOCULAR | Status: DC | PRN
  Administered 2018-03-30: 57 mL via OPHTHALMIC

## 2018-03-30 MED ORDER — SODIUM HYALURONATE 10 MG/ML IO SOLN
INTRAOCULAR | Status: DC | PRN
  Administered 2018-03-30: 0.55 mL via INTRAOCULAR

## 2018-03-30 MED ORDER — ONDANSETRON HCL 4 MG/2ML IJ SOLN: 4.0000 mg | Freq: Once | INTRAMUSCULAR | Status: DC | PRN

## 2018-03-30 MED ORDER — FENTANYL CITRATE (PF) 100 MCG/2ML IJ SOLN
INTRAMUSCULAR | Status: DC | PRN
  Administered 2018-03-30: 50 ug via INTRAVENOUS

## 2018-03-30 MED ORDER — LACTATED RINGERS IV SOLN: 10.0000 mL/h | INTRAVENOUS | Status: DC

## 2018-03-30 MED ORDER — SODIUM HYALURONATE 23 MG/ML IO SOLN
INTRAOCULAR | Status: DC | PRN
  Administered 2018-03-30: 0.6 mL via INTRAOCULAR

## 2018-03-30 MED ORDER — ONDANSETRON HCL 4 MG/2ML IJ SOLN
INTRAMUSCULAR | Status: DC | PRN
  Administered 2018-03-30: 4 mg via INTRAVENOUS

## 2018-03-30 MED ORDER — MIDAZOLAM HCL 2 MG/2ML IJ SOLN
INTRAMUSCULAR | Status: DC | PRN
  Administered 2018-03-30: 2 mg via INTRAVENOUS

## 2018-03-30 SURGICAL SUPPLY — 17 items
CANNULA ANT/CHMB 27G (MISCELLANEOUS) ×1 IMPLANT
CANNULA ANT/CHMB 27GA (MISCELLANEOUS) ×3 IMPLANT
DISSECTOR HYDRO NUCLEUS 50X22 (MISCELLANEOUS) ×3 IMPLANT
GLOVE BIO SURGEON STRL SZ8 (GLOVE) ×3 IMPLANT
GLOVE SURG LX 7.5 STRW (GLOVE) ×2
GLOVE SURG LX STRL 7.5 STRW (GLOVE) ×1 IMPLANT
GOWN STRL REUS W/ TWL LRG LVL3 (GOWN DISPOSABLE) ×2 IMPLANT
GOWN STRL REUS W/TWL LRG LVL3 (GOWN DISPOSABLE) ×4
LENS IOL TECNIS SYMFONY 6.5 ×2 IMPLANT
MARKER SKIN DUAL TIP RULER LAB (MISCELLANEOUS) ×3 IMPLANT
PACK CATARACT (MISCELLANEOUS) ×3 IMPLANT
PACK DR. KING ARMS (PACKS) ×3 IMPLANT
PACK EYE AFTER SURG (MISCELLANEOUS) ×3 IMPLANT
SYR 3ML LL SCALE MARK (SYRINGE) ×3 IMPLANT
SYR TB 1ML LUER SLIP (SYRINGE) ×3 IMPLANT
WATER STERILE IRR 500ML POUR (IV SOLUTION) ×3 IMPLANT
WIPE NON LINTING 3.25X3.25 (MISCELLANEOUS) ×3 IMPLANT

## 2018-03-30 NOTE — Transfer of Care (Signed)
Immediate Anesthesia Transfer of Care Note  Patient: Emma Barker  Procedure(s) Performed: CATARACT EXTRACTION PHACO AND INTRAOCULAR LENS PLACEMENT (IOC) SYMFONY LENS LEFT (Left Eye)  Patient Location: PACU  Anesthesia Type: MAC  Level of Consciousness: awake, alert  and patient cooperative  Airway and Oxygen Therapy: Patient Spontanous Breathing and Patient connected to supplemental oxygen  Post-op Assessment: Post-op Vital signs reviewed, Patient's Cardiovascular Status Stable, Respiratory Function Stable, Patent Airway and No signs of Nausea or vomiting  Post-op Vital Signs: Reviewed and stable  Complications: No apparent anesthesia complications

## 2018-03-30 NOTE — Anesthesia Procedure Notes (Signed)
Procedure Name: MAC Date/Time: 03/30/2018 10:46 AM Performed by: Janna Arch, CRNA Pre-anesthesia Checklist: Patient identified, Emergency Drugs available, Patient being monitored and Suction available Patient Re-evaluated:Patient Re-evaluated prior to induction Oxygen Delivery Method: Nasal cannula

## 2018-03-30 NOTE — Anesthesia Preprocedure Evaluation (Signed)
Anesthesia Evaluation  Patient identified by MRN, date of birth, ID band Patient awake    Reviewed: Allergy & Precautions, H&P , NPO status , Patient's Chart, lab work & pertinent test results  History of Anesthesia Complications (+) PONV and history of anesthetic complications  Airway Mallampati: III  TM Distance: >3 FB Neck ROM: full    Dental   Pulmonary sleep apnea , Current Smoker,    Pulmonary exam normal breath sounds clear to auscultation       Cardiovascular hypertension, Normal cardiovascular exam Rhythm:Regular Rate:Normal     Neuro/Psych Anxiety    GI/Hepatic GERD  ,  Endo/Other  Hypothyroidism   Renal/GU      Musculoskeletal  (+) Arthritis ,   Abdominal   Peds  Hematology   Anesthesia Other Findings   Reproductive/Obstetrics                             Anesthesia Physical  Anesthesia Plan  ASA: III  Anesthesia Plan: MAC   Post-op Pain Management:    Induction:   PONV Risk Score and Plan: 2 and 3 and Treatment may vary due to age or medical condition and Midazolam  Airway Management Planned:   Additional Equipment:   Intra-op Plan:   Post-operative Plan:   Informed Consent: I have reviewed the patients History and Physical, chart, labs and discussed the procedure including the risks, benefits and alternatives for the proposed anesthesia with the patient or authorized representative who has indicated his/her understanding and acceptance.     Plan Discussed with: CRNA  Anesthesia Plan Comments:         Anesthesia Quick Evaluation

## 2018-03-30 NOTE — Anesthesia Postprocedure Evaluation (Signed)
Anesthesia Post Note  Patient: Emma Barker  Procedure(s) Performed: CATARACT EXTRACTION PHACO AND INTRAOCULAR LENS PLACEMENT (IOC) SYMFONY LENS LEFT (Left Eye)  Patient location during evaluation: PACU Anesthesia Type: MAC Level of consciousness: awake and alert Pain management: pain level controlled Vital Signs Assessment: post-procedure vital signs reviewed and stable Respiratory status: spontaneous breathing, nonlabored ventilation, respiratory function stable and patient connected to nasal cannula oxygen Cardiovascular status: stable and blood pressure returned to baseline Postop Assessment: no apparent nausea or vomiting Anesthetic complications: no    Veda Canning

## 2018-03-30 NOTE — H&P (Signed)
The History and Physical notes are on paper, have been signed, and are to be scanned.   I have examined the patient and there are no changes to the H&P.   Emma Barker 03/30/2018 10:37 AM

## 2018-03-30 NOTE — Op Note (Signed)
OPERATIVE NOTE  ZULY BELKIN [id number] 03/30/2018   PREOPERATIVE DIAGNOSIS:  Nuclear sclerotic cataract left eye.  H25.12   POSTOPERATIVE DIAGNOSIS:    Nuclear sclerotic cataract left eye.     PROCEDURE:  Phacoemusification with posterior chamber intraocular lens placement of the left eye   LENS:   Implant Name Type Inv. Item Serial No. Manufacturer Lot No. LRB No. Used  LENS IOL SYMFONY 6.5 - [id number]  LENS IOL SYMFONY 6.5 [id number] AMO  Left 1       [id number] +06.5   ULTRASOUND TIME: 0 minutes 40 seconds.  CDE 3.18   SURGEON:  Benay Pillow, MD, MPH   ANESTHESIA:  Topical with tetracaine drops augmented with 1% preservative-free intracameral lidocaine.  ESTIMATED BLOOD LOSS: <1 mL   COMPLICATIONS:  None.   DESCRIPTION OF PROCEDURE:  The patient was identified in the holding room and transported to the operating room and placed in the supine position under the operating microscope.  The left eye was identified as the operative eye and it was prepped and draped in the usual sterile ophthalmic fashion.   A 1.0 millimeter clear-corneal paracentesis was made at the 5:00 position. 0.5 ml of preservative-free 1% lidocaine with epinephrine was injected into the anterior chamber.  The anterior chamber was filled with Healon 5 viscoelastic.  A 2.4 millimeter keratome was used to make a near-clear corneal incision at the 2:00 position.  A curvilinear capsulorrhexis was made with a cystotome and capsulorrhexis forceps.  Balanced salt solution was used to hydrodissect and hydrodelineate the nucleus.   Phacoemulsification was then used in stop and chop fashion to remove the lens nucleus and epinucleus.  The remaining cortex was then removed using the irrigation and aspiration handpiece. Healon was then placed into the capsular bag to distend it for lens placement.  A lens was then injected into the capsular bag.  The remaining viscoelastic was aspirated.   Wounds were hydrated with  balanced salt solution.  The anterior chamber was inflated to a physiologic pressure with balanced salt solution.  Intracameral vigamox 0.1 mL undiltued was injected into the eye and a drop placed onto the ocular surface.  No wound leaks were noted.  The patient was taken to the recovery room in stable condition without complications of anesthesia or surgery  Benay Pillow 03/30/2018, 11:11 AM

## 2018-04-06 ENCOUNTER — Ambulatory Visit
Admission: RE | Admit: 2018-04-06 | Discharge: 2018-04-06 | Disposition: A | Discharge status: Home / Self Care | Source: Ambulatory Visit | Attending: Family Medicine | Admitting: Family Medicine

## 2018-04-06 ENCOUNTER — Other Ambulatory Visit: Payer: Self-pay | Admitting: Family Medicine

## 2018-04-06 DIAGNOSIS — M25562 Pain in left knee: Secondary | ICD-10-CM | POA: Diagnosis present

## 2018-07-06 ENCOUNTER — Telehealth: Payer: Self-pay | Admitting: Obstetrics & Gynecology

## 2018-07-06 NOTE — Telephone Encounter (Signed)
Patient is schedule 07/31/18 with [facility]

## 2018-07-06 NOTE — Telephone Encounter (Signed)
-----   Message from Gae Dry, MD sent at 07/06/2018  7:28 AM EST ----- Regarding: Annual Sch annual and encourage MMG

## 2018-07-31 ENCOUNTER — Ambulatory Visit: Admitting: Obstetrics & Gynecology

## 2018-07-31 ENCOUNTER — Ambulatory Visit (INDEPENDENT_AMBULATORY_CARE_PROVIDER_SITE_OTHER): Admitting: Obstetrics & Gynecology

## 2018-07-31 ENCOUNTER — Encounter: Payer: Self-pay | Admitting: Obstetrics & Gynecology

## 2018-07-31 VITALS — BP 140/80 | Ht 70.0 in | Wt 217.0 lb

## 2018-07-31 DIAGNOSIS — Z01419 Encounter for gynecological examination (general) (routine) without abnormal findings: Secondary | ICD-10-CM | POA: Diagnosis not present

## 2018-07-31 DIAGNOSIS — Z1211 Encounter for screening for malignant neoplasm of colon: Secondary | ICD-10-CM

## 2018-07-31 DIAGNOSIS — Z1239 Encounter for other screening for malignant neoplasm of breast: Secondary | ICD-10-CM

## 2018-07-31 DIAGNOSIS — L900 Lichen sclerosus et atrophicus: Secondary | ICD-10-CM

## 2018-07-31 DIAGNOSIS — N3946 Mixed incontinence: Secondary | ICD-10-CM

## 2018-07-31 DIAGNOSIS — Z0000 Encounter for general adult medical examination without abnormal findings: Secondary | ICD-10-CM

## 2018-07-31 MED ORDER — CLOBETASOL PROPIONATE 0.05 % EX CREA: 1.0000 "application " | TOPICAL_CREAM | Freq: Two times a day (BID) | CUTANEOUS | 3 refills | Status: DC

## 2018-07-31 NOTE — Patient Instructions (Signed)
PAP every 5 years (due 2022) Mammogram every year, due soon    Call [phone] to schedule at [facility] Colonoscopy every 10 years, due soon Labs yearly (with PCP)  Urethral Vaginal Sling A urethral vaginal sling procedure is surgery to correct urinary incontinence. Urinary incontinence is uncontrolled loss of urine. It is common in women who have had children and in older women. In this surgery, a strong piece of material is placed under the tube that drains the bladder (urethra). This sling is made of tension-free vaginal tape or nylon mesh. It fits under the urethra like a hammock. The sling is put in position to straighten, support, and hold the urethra in its normal position. Tell a health care provider about:  Any allergies you have.  All medicines you are taking, including vitamins, herbs, eye drops, creams, and over-the-counter medicines.  Any problems you or family members have had with anesthetic medicines.  Any blood disorders you have.  Any surgeries you have had.  Any medical conditions you have. What are the risks? Generally, this is a safe procedure. However, as with any procedure, complications can occur. Possible complications include:  Infection.  Excessive bleeding.  Damage to other organs.  Problems urinating properly for several days or weeks.  Problems from the use of anesthetics.  Return of the urinary incontinence.  What happens before the procedure?  Ask your health care provider about changing or stopping your regular medicines. You may need to stop taking certain medicines 1 week before the surgery.  Do not eat or drink anything for 6-8 hours before the surgery.  If you smoke, do not smoke for at least 2 weeks before the surgery.  Make plans to have someone drive you home after your hospital stay. Also arrange for someone to help you with activities during recovery. What happens during the procedure?  You will have general or spinal  anesthesia. With general anesthesia, you are asleep and will feel no pain. With spinal anesthesia, you are numb from the waist down, but you will still be awake.  A catheter is placed in your bladder to drain urine during the procedure.  An incision is made in your vagina and low on your belly in the hairline.  The sling material is passed around your bladder neck and sutured to the muscles to hold the urethra in its normal position.  The incisions are closed. What happens after the procedure?  You will be taken to a recovery area where your progress will be monitored closely. Your breathing, blood pressure, and pulse (vital signs) will be checked often. When you are stable, you will be moved to a regular hospital room.  You will have a catheter in place to drain your bladder. This will stay in place until your bladder is working properly on its own.  You may have a gauze packing in the vagina to prevent bleeding. This will be removed in 1-2 days.  You will likely need to stay in the hospital for 2-3 days. This information is not intended to replace advice given to you by your health care provider. Make sure you discuss any questions you have with your health care provider. Document Released: 05/21/2008 Document Revised: 01/18/2016 Document Reviewed: 01/29/2013 Elsevier Interactive Patient Education  [employer].

## 2018-07-31 NOTE — Progress Notes (Signed)
HPI:      [name] is a 52 y.o. [phone] who LMP was in the past as had TAH w [facility] in 1998, she presents today for her annual examination.  The patient has worsening mixed urinary incontinence with no response to Myrbetriq, leaks daily with any activity and sometimes w urgency alone.  No vaginal pain pressure or prolapse sx's.  Lichen Sclerosus is irritated by the constant leakage of urine.  The patient is not currently sexually active. Herlast pap: approximate date 2017 and was normal and last mammogram: approximate date 2017 and was normal.  The patient does perform self breast exams.  There is no notable family history of breast or ovarian cancer in her family. The patient is not taking hormone replacement therapy. Patient denies post-menopausal vaginal bleeding.   The patient has regular exercise: yes. The patient denies current symptoms of depression.    GYN Hx: Last Colonoscopy:10 years ago. Normal.    PMHx: Past Medical History:  Diagnosis Date  . Anxiety    agoraphobia  . Arthritis    osteo - everywhere  . Chest pain    related to thyroid issues  . GERD (gastroesophageal reflux disease)   . Graves disease   . Graves disease   . Headache    sinus  . Hereditary pancreatitis   . Hypertension   . Hypothyroid   . Mitral valve regurgitation   . PONV (postoperative nausea and vomiting)   . Sleep apnea    supposed to use CPAP - no machine  . Stress incontinence   . Tricuspid valve regurgitation   . Von Willebrand's disease ([place])   . Wears contact lenses    Past Surgical History:  Procedure Laterality Date  . ABDOMINAL HYSTERECTOMY    . APPENDECTOMY    . CATARACT EXTRACTION W/PHACO Right 03/09/2018   Procedure: CATARACT EXTRACTION PHACO AND INTRAOCULAR LENS PLACEMENT ([place]) RIGHT IVA TOPICAL SYMFONY LENS;  Surgeon: Eulogio Bear, MD;  Location: [place];  Service: Ophthalmology;  Laterality: Right;  . CATARACT EXTRACTION W/PHACO Left 03/30/2018   Procedure: CATARACT EXTRACTION PHACO AND INTRAOCULAR LENS PLACEMENT ([place]) SYMFONY LENS LEFT;  Surgeon: Eulogio Bear, MD;  Location: [place];  Service: Ophthalmology;  Laterality: Left;  . CHOLECYSTECTOMY    . IMAGE GUIDED SINUS SURGERY Right 05/16/2015   Procedure: IMAGE GUIDED SINUS SURGERY, RIGHT ENDOSCOPIC MAXILLARY ANTROSTOMY, RIGHT ENDOSCOPIC ANTERIOR ETHMOIDECTOMY AND RIGHT ENDOSCOPIC FRONTAL RECESS EXPLORATION;  Surgeon: Clyde Canterbury, MD;  Location: [place];  Service: ENT;  Laterality: Right;  GAVE DISK TO CE CE  . POLYPECTOMY     Family History  Problem Relation Age of Onset  . Stroke Father   . Pancreatic cancer Mother   . Stroke Mother   . Hematuria Daughter    Social History   Tobacco Use  . Smoking status: Current Every Day Smoker    Packs/day: 0.75    Years: 25.00    Pack years: 18.75    Last attempt to quit: 12/09/2014    Years since quitting: 3.6  . Smokeless tobacco: Never Used  Substance Use Topics  . Alcohol use: No  . Drug use: No    Current Outpatient Medications:  .  aspirin EC 81 MG tablet, Take by mouth., Disp: , Rfl:  .  cholecalciferol (VITAMIN D) 1000 UNITS tablet, Take 3,000 Units by mouth daily., Disp: , Rfl:  .  clobetasol cream (TEMOVATE) 7.89 %, Apply 1 application topically 2 (two) times daily.,  Disp: 30 g, Rfl: 3 .  fluticasone (FLONASE) 50 MCG/ACT nasal spray, Place 1-2 sprays into both nostrils daily., Disp: , Rfl:  .  losartan-hydrochlorothiazide (HYZAAR) 50-12.5 MG tablet, , Disp: , Rfl:  .  Magnesium 250 MG TABS, Take by mouth daily., Disp: , Rfl:  .  metroNIDAZOLE (METROGEL) 0.75 % gel, Apply 1 application topically 2 (two) times daily as needed., Disp: , Rfl:  .  PARoxetine (PAXIL) 40 MG tablet, Take 40 mg by mouth daily., Disp: , Rfl:  .  hydrochlorothiazide (MICROZIDE) 12.5 MG capsule, Take 12.5 mg by mouth daily. Reported on 11/03/2015, Disp: , Rfl:  .  levothyroxine (SYNTHROID, LEVOTHROID) 25 MCG tablet, Take  25 mcg by mouth as needed., Disp: , Rfl:  .  nitroGLYCERIN (NITROSTAT) 0.4 MG SL tablet, Place 0.4 mg under the tongue every 5 (five) minutes as needed for chest pain. Reported on 11/03/2015, Disp: , Rfl:  .  omeprazole (PRILOSEC) 40 MG capsule, Take 40 mg by mouth daily. AM, Disp: , Rfl:  .  simvastatin (ZOCOR) 10 MG tablet, Take 10 mg by mouth daily. lunchtime, Disp: , Rfl:  Allergies: Contrast media [iodinated diagnostic agents]  Review of Systems  Constitutional: Negative for chills, fever and malaise/fatigue.  HENT: Negative for congestion, sinus pain and sore throat.   Eyes: Negative for blurred vision and pain.  Respiratory: Negative for cough and wheezing.   Cardiovascular: Negative for chest pain and leg swelling.  Gastrointestinal: Negative for abdominal pain, constipation, diarrhea, heartburn, nausea and vomiting.  Genitourinary: Positive for frequency and urgency. Negative for dysuria and hematuria.  Musculoskeletal: Negative for back pain, joint pain, myalgias and neck pain.  Skin: Negative for itching and rash.  Neurological: Negative for dizziness, tremors and weakness.  Endo/Heme/Allergies: Does not bruise/bleed easily.  Psychiatric/Behavioral: Negative for depression. The patient is not nervous/anxious and does not have insomnia.    Objective: BP 140/80   Ht 5\' 10"  (1.778 m)   Wt 217 lb (98.4 kg)   BMI 31.14 kg/m   Filed Weights   07/31/18 1041  Weight: 217 lb (98.4 kg)   Body mass index is 31.14 kg/m. Physical Exam  Constitutional: She is oriented to person, place, and time. She appears well-developed and well-nourished. No distress.  Genitourinary: Rectum normal and vagina normal. Pelvic exam was performed with patient supine. There is no rash or lesion on the right labia. There is no rash or lesion on the left labia. Vagina exhibits no lesion. No bleeding in the vagina. Right adnexum does not display mass and does not display tenderness. Left adnexum does not  display mass and does not display tenderness.  Genitourinary Comments: Absent Uterus Absent cervix Vaginal cuff well healed Gr 2 cystocele <30 degree qtip test  HENT:  Head: Normocephalic and atraumatic. Head is without laceration.  Right Ear: Hearing normal.  Left Ear: Hearing normal.  Nose: No epistaxis.  No foreign bodies.  Mouth/Throat: Uvula is midline, oropharynx is clear and moist and mucous membranes are normal.  Eyes: Pupils are equal, round, and reactive to light.  Neck: Normal range of motion. Neck supple. No thyromegaly present.  Cardiovascular: Normal rate and regular rhythm. Exam reveals no gallop and no friction rub.  No murmur heard. Pulmonary/Chest: Effort normal and breath sounds normal. No respiratory distress. She has no wheezes. Right breast exhibits no mass, no skin change and no tenderness. Left breast exhibits no mass, no skin change and no tenderness.  Abdominal: Soft. Bowel sounds are normal. She exhibits no  distension. There is no tenderness. There is no rebound.  Musculoskeletal: Normal range of motion.  Neurological: She is alert and oriented to person, place, and time. No cranial nerve deficit.  Skin: Skin is warm and dry.  Psychiatric: She has a normal mood and affect. Judgment normal.  Vitals reviewed.  Assessment: Annual Exam 1. Annual physical exam   2. Screening for breast cancer   3. Screen for colon cancer   4. Mixed stress and urge urinary incontinence   5. Lichen sclerosus    Plan:            1.  Cervical Screening-  Pap smear schedule reviewed with patient  2. Breast screening- Exam annually and mammogram scheduled Sch soon  3. Colonoscopy every 10 years, Sch Soon. Hemoccult testing after age 72  4. Labs managed by PCP  5. Counseling for hormonal therapy: none  6. Lichen Sclerosus, stable although worsened sx's w constant leakage of urine Clobetasol  7. Mixed Urinary Incontinence. Options for treatment, pessary and surgery,  discussed. Rec Ant repair with Pubovaginal sling (TVT) Sch for Jan 23    F/U  Return for Izora Gala to schedule preop.  Barnett Applebaum, MD, [facility], [facility] 07/31/2018  11:18 AM

## 2018-08-03 ENCOUNTER — Telehealth: Payer: Self-pay | Admitting: Obstetrics & Gynecology

## 2018-08-03 NOTE — Telephone Encounter (Signed)
Patient is aware of H&P at [facility] on 09/11/18 @ 11am w/ Dr Kenton Kingfisher, Pre-admit Testing phone interview to be scheduled, and OR on 09/17/18. Patient is aware she may receive calls from the [facility] and [facility]. Patient confirmed [employer], and no secondary insurance.

## 2018-08-03 NOTE — Telephone Encounter (Signed)
Per [email or web address], no prior Josem Kaufmann is required for [id number], [id number].

## 2018-08-03 NOTE — Telephone Encounter (Signed)
-----   Message from Gae Dry, MD sent at 07/31/2018 11:12 AM EST ----- Regarding: Surgery Surgery Booking Request Patient Full Name:  Emma Barker  MRN: [id number]  DOB: [birth year]  Surgeon: Hoyt Koch, MD  Requested Surgery Date and Time: 09/17/2018 Primary Diagnosis AND Code: Mixed urinary Incontinence Secondary Diagnosis and Code:  Surgical Procedure: Anterior repair, TVT Pubovaginal sling L&D Notification: No Admission Status: same day surgery Length of Surgery: 1 hr Special Case Needs: TVT EXACT H&P: yes (date) Phone Interview???: yes Interpreter: Language:  Medical Clearance: no Special Scheduling Instructions: no

## 2018-08-26 LAB — HM HEPATITIS C SCREENING LAB: HM Hepatitis Screen: NEGATIVE

## 2018-08-28 ENCOUNTER — Encounter: Admitting: Obstetrics and Gynecology

## 2018-09-09 ENCOUNTER — Encounter: Payer: Self-pay | Admitting: Obstetrics and Gynecology

## 2018-09-09 ENCOUNTER — Ambulatory Visit (INDEPENDENT_AMBULATORY_CARE_PROVIDER_SITE_OTHER): Admitting: Obstetrics and Gynecology

## 2018-09-09 VITALS — BP 118/76 | HR 88 | Ht 70.0 in | Wt 214.0 lb

## 2018-09-09 DIAGNOSIS — N8110 Cystocele, unspecified: Secondary | ICD-10-CM

## 2018-09-09 DIAGNOSIS — N952 Postmenopausal atrophic vaginitis: Secondary | ICD-10-CM | POA: Diagnosis not present

## 2018-09-09 DIAGNOSIS — R32 Unspecified urinary incontinence: Secondary | ICD-10-CM

## 2018-09-09 LAB — POCT URINALYSIS DIPSTICK
Bilirubin, UA: NEGATIVE
Glucose, UA: NEGATIVE
KETONES UA: NEGATIVE
LEUKOCYTES UA: NEGATIVE
NITRITE UA: NEGATIVE
PROTEIN UA: NEGATIVE
SPEC GRAV UA: 1.02 (ref 1.010–1.025)
Urobilinogen, UA: 0.2 E.U./dL
pH, UA: 6 (ref 5.0–8.0)

## 2018-09-09 NOTE — Progress Notes (Signed)
GYNECOLOGY CLINIC PROGRESS NOTE Subjective:     Emma Barker is a 53 y.o. [phone] menopausal female who was self-referred for evaluation of urinary incontinence (was recommended by Vonda Antigua, FNP). She has a past surgical history of TAH with Burch procedure in 1998. This has been present for several years, slowly getting worse.  She leaks urine with coughing, standing, walking, laughing, sneezing. Patient describes the symptoms as urine leakage with coughing/heavy physical activity and voiding small amounts. Factors associated with symptoms include: associated with menopause. Evaluation to date includes UA/CS: normal and Urodynamics: inconclusive (patient notes she did properly complete the testing).  States that she was seen at [facility] 1-2 months ago, where it was recommended that she have surgery to correct the issue. Patient desires a second opinion as she notes she is not quite ready for surgical intervention at this time if she can help it.  Notes that she has been tried on Myrbetric (for prior noted urge component) with no relief, however patient denies urgency today.    Gynecologic History:  Menses:  Patient is postmenopausal Last pap smear: 09/2015. Results were negative.   Last mammogram: 2017. Results were normal.  Last colonoscopy: ~ 10 years ago. Patient with last FOBT 06/2017.   Past Medical History:  Diagnosis Date  . Anxiety    agoraphobia  . Arthritis    osteo - everywhere  . Chest pain    related to thyroid issues  . GERD (gastroesophageal reflux disease)   . Graves disease   . Graves disease   . Graves' disease   . Graves' disease   . Headache    sinus  . Hereditary pancreatitis   . Hypertension   . Hypothyroid   . Mitral valve regurgitation   . PONV (postoperative nausea and vomiting)   . Sleep apnea    supposed to use CPAP - no machine  . Stress incontinence   . Tricuspid valve regurgitation   . Von Willebrand's disease ([place])   . Wears  contact lenses     Family History  Problem Relation Age of Onset  . Stroke Father   . Pancreatic cancer Mother   . Stroke Mother   . Hematuria Daughter     Past Surgical History:  Procedure Laterality Date  . ABDOMINAL HYSTERECTOMY    . APPENDECTOMY    . CATARACT EXTRACTION W/PHACO Right 03/09/2018   Procedure: CATARACT EXTRACTION PHACO AND INTRAOCULAR LENS PLACEMENT ([place]) RIGHT IVA TOPICAL SYMFONY LENS;  Surgeon: Eulogio Bear, MD;  Location: [facility];  Service: Ophthalmology;  Laterality: Right;  . CATARACT EXTRACTION W/PHACO Left 03/30/2018   Procedure: CATARACT EXTRACTION PHACO AND INTRAOCULAR LENS PLACEMENT ([place]) SYMFONY LENS LEFT;  Surgeon: Eulogio Bear, MD;  Location: [facility];  Service: Ophthalmology;  Laterality: Left;  . CHOLECYSTECTOMY    . IMAGE GUIDED SINUS SURGERY Right 05/16/2015   Procedure: IMAGE GUIDED SINUS SURGERY, RIGHT ENDOSCOPIC MAXILLARY ANTROSTOMY, RIGHT ENDOSCOPIC ANTERIOR ETHMOIDECTOMY AND RIGHT ENDOSCOPIC FRONTAL RECESS EXPLORATION;  Surgeon: Clyde Canterbury, MD;  Location: [facility];  Service: ENT;  Laterality: Right;  GAVE DISK TO CE CE  . POLYPECTOMY      Social History   Socioeconomic History  . Marital status: Married    Spouse name: Not on file  . Number of children: Not on file  . Years of education: Not on file  . Highest education level: Not on file  Occupational History  . Not on file  Social Needs  .  Financial resource strain: Not on file  . Food insecurity:    Worry: Not on file    Inability: Not on file  . Transportation needs:    Medical: Not on file    Non-medical: Not on file  Tobacco Use  . Smoking status: Current Every Day Smoker    Packs/day: 0.75    Years: 25.00    Pack years: 18.75    Last attempt to quit: 12/09/2014    Years since quitting: 3.7  . Smokeless tobacco: Never Used  Substance and Sexual Activity  . Alcohol use: No  . Drug use: No  . Sexual activity: Yes  Lifestyle    . Physical activity:    Days per week: Not on file    Minutes per session: Not on file  . Stress: Not on file  Relationships  . Social connections:    Talks on phone: Not on file    Gets together: Not on file    Attends religious service: Not on file    Active member of club or organization: Not on file    Attends meetings of clubs or organizations: Not on file    Relationship status: Not on file  . Intimate partner violence:    Fear of current or ex partner: Not on file    Emotionally abused: Not on file    Physically abused: Not on file    Forced sexual activity: Not on file  Other Topics Concern  . Not on file  Social History Narrative  . Not on file    Current Outpatient Medications on File Prior to Visit  Medication Sig Dispense Refill  . aspirin EC 81 MG tablet Take by mouth.    . cholecalciferol (VITAMIN D) 1000 UNITS tablet Take 5,000 Units by mouth daily.     . clobetasol cream (TEMOVATE) 1.61 % Apply 1 application topically 2 (two) times daily. 30 g 3  . fluticasone (FLONASE) 50 MCG/ACT nasal spray Place 1-2 sprays into both nostrils daily.    . hydrochlorothiazide (MICROZIDE) 12.5 MG capsule Take 12.5 mg by mouth daily. Reported on 11/03/2015    . levothyroxine (SYNTHROID, LEVOTHROID) 25 MCG tablet Take 25 mcg by mouth as needed.    Marland Kitchen losartan-hydrochlorothiazide (HYZAAR) 50-12.5 MG tablet     . Magnesium 250 MG TABS Take by mouth daily.    . metroNIDAZOLE (METROGEL) 0.75 % gel Apply 1 application topically 2 (two) times daily as needed.    Marland Kitchen omeprazole (PRILOSEC) 40 MG capsule Take 40 mg by mouth daily. AM    . PARoxetine (PAXIL) 40 MG tablet Take 40 mg by mouth daily.    . simvastatin (ZOCOR) 10 MG tablet Take 10 mg by mouth daily. lunchtime     No current facility-administered medications on file prior to visit.     Allergies  Allergen Reactions  . Contrast Media [Iodinated Diagnostic Agents] Hives and Nausea Only    Review of Systems Pertinent items  noted in HPI and remainder of comprehensive ROS otherwise negative.    Objective:    BP 118/76   Pulse 88   Ht 5\' 10"  (1.778 m)   Wt 214 lb (97.1 kg)   BMI 30.71 kg/m  General appearance: alert and no distress Abdomen: soft, non-tender; bowel sounds normal; no masses,  no organomegaly Pelvic exam:        VULVA: normal appearing vulva with no masses, tenderness or lesions,      VAGINA:  cystocele Grade 2, mild vaginal atrophy,  no lesions. Weak pelvic floor muscles.       CERVIX: surgically absent,       UTERUS: surgically absent, vaginal cuff well healed, ADNEXA: not examined      RECTAL: normal external rectal, no masses. Extremities: extremities normal, atraumatic, no cyanosis or edema Skin: Skin color, texture, turgor normal. No rashes or lesions    Lab Review Results for orders placed or performed in visit on 09/09/18  POCT urinalysis dipstick  Result Value Ref Range   Color, UA yellow    Clarity, UA clear    Glucose, UA Negative Negative   Bilirubin, UA neg    Ketones, UA neg    Spec Grav, UA 1.020 1.010 - 1.025   Blood, UA hem trace    pH, UA 6.0 5.0 - 8.0   Protein, UA Negative Negative   Urobilinogen, UA 0.2 0.2 or 1.0 E.U./dL   Nitrite, UA neg    Leukocytes, UA Negative Negative   Appearance yellow    Odor       Assessment:    Stress incontinence. Severity = moderate.   Grade 2 cystocele  Vaginal atrophy  Plan:    The causes of incontinence and plan for evaluation and treatment were discussed. Appropriate educational materials were distributed. Discussed Kegel exercised in detail. Risk of alternatives were discussed and all questions answered.   Discussed pessary use, fitting performed today. Will return in 2 weeks for placement.  Given samples of Premarin cream to help with atrophy prior to pessary placement.     Rubie Maid, MD [facility]

## 2018-09-09 NOTE — Patient Instructions (Signed)
Kegel Exercises  Kegel exercises help strengthen the muscles that support the rectum, vagina, small intestine, bladder, and uterus. Doing Kegel exercises can help:   Improve bladder and bowel control.   Improve sexual response.   Reduce problems and discomfort during pregnancy.  Kegel exercises involve squeezing your pelvic floor muscles, which are the same muscles you squeeze when you try to stop the flow of urine. The exercises can be done while sitting, standing, or lying down, but it is best to vary your position.  Exercises  1. Squeeze your pelvic floor muscles tight. You should feel a tight lift in your rectal area. If you are a female, you should also feel a tightness in your vaginal area. Keep your stomach, buttocks, and legs relaxed.  2. Hold the muscles tight for up to 10 seconds.  3. Relax your muscles.  Repeat this exercise 50 times a day or as many times as told by your health care provider. Continue to do this exercise for at least 4-6 weeks or for as long as told by your health care provider.  This information is not intended to replace advice given to you by your health care provider. Make sure you discuss any questions you have with your health care provider.  Document Released: 07/29/2012 Document Revised: 12/23/2016 Document Reviewed: 07/02/2015  Elsevier Interactive Patient Education  2019 [employer] Inc[employer]  Urinary Incontinence    Urinary incontinence refers to a condition in which a person is unable to control where and when to pass urine. A person with this condition will urinate when he or she does not mean to (involuntarily).  What are the causes?  This condition may be caused by:   Medicines.   Infections.   Constipation.   Overactive bladder muscles.   Weak bladder muscles.   Weak pelvic floor muscles. These muscles provide support for the bladder, intestine, and, in women, the uterus.   Enlarged prostate in men. The prostate is a gland near the bladder. When it gets too big, it can  pinch the urethra. With the urethra blocked, the bladder can weaken and lose the ability to empty properly.   Surgery.   Emotional factors, such as anxiety, stress, or post-traumatic stress disorder (PTSD).   Pelvic organ prolapse. This happens in women when organs shift out of place and into the vagina. This shift can prevent the bladder and urethra from working properly.  What increases the risk?  The following factors may make you more likely to develop this condition:   Older age.   Obesity and physical inactivity.   Pregnancy and childbirth.   Menopause.   Diseases that affect the nerves or spinal cord (neurological diseases).   Long-term (chronic) coughing. This can increase pressure on the bladder and pelvic floor muscles.  What are the signs or symptoms?  Symptoms may vary depending on the type of urinary incontinence you have. They include:   A sudden urge to urinate, but passing urine involuntarily before you can get to a bathroom (urge incontinence).   Suddenly passing urine with any activity that forces urine to pass, such as coughing, laughing, exercise, or sneezing (stress incontinence).   Needing to urinate often, but urinating only a small amount, or constantly dribbling urine (overflow incontinence).   Urinating because you cannot get to the bathroom in time due to a physical disability, such as arthritis or injury, or communication and thinking problems, such as Alzheimer disease (functional incontinence).  How is this diagnosed?  This condition   may be diagnosed based on:   Your medical history.   A physical exam.   Tests, such as:  ? Urine tests.  ? X-rays of your kidney and bladder.  ? Ultrasound.  ? CT scan.  ? Cystoscopy. In this procedure, a health care provider inserts a tube with a light and camera (cystoscope) through the urethra and into the bladder in order to check for problems.  ? Urodynamic testing. These tests assess how well the bladder, urethra, and sphincter can  store and release urine. There are different types of urodynamic tests, and they vary depending on what the test is measuring.  To help diagnose your condition, your health care provider may recommend that you keep a log of when you urinate and how much you urinate.  How is this treated?  Treatment for this condition depends on the type of incontinence that you have and its cause. Treatment may include:   Lifestyle changes, such as:  ? Quitting smoking.  ? Maintaining a healthy weight.  ? Staying active. Try to get 150 minutes of moderate-intensity exercise every week. Ask your health care provider which activities are safe for you.  ? Eating a healthy diet.   Avoid high-fat foods, like fried foods.   Avoid refined carbohydrates like white bread and white rice.   Limit how much alcohol and caffeine you drink.   Increase your fiber intake. Foods such as fresh fruits, vegetables, beans, and whole grains are healthy sources of fiber.   Pelvic floor muscle exercises.   Bladder training, such as lengthening the amount of time between bathroom breaks, or using the bathroom at regular intervals.   Using techniques to suppress bladder urges. This can include distraction techniques or controlled breathing exercises.   Medicines to relax the bladder muscles and prevent bladder spasms.   Medicines to help slow or prevent the growth of a man's prostate.   Botox injections. These can help relax the bladder muscles.   Using pulses of electricity to help change bladder reflexes (electrical nerve stimulation).   For women, using a medical device to prevent urine leaks. This is a small, tampon-like, disposable device that is inserted into the urethra.   Injecting collagen or carbon beads (bulking agents) into the urinary sphincter. These can help thicken tissue and close the bladder opening.   Surgery.  Follow these instructions at home:  Lifestyle   Limit alcohol and caffeine. These can fill your bladder quickly and  irritate it.   Keep yourself clean to help prevent odors and skin damage. Ask your doctor about special skin creams and cleansers that can protect the skin from urine.   Consider wearing pads or adult diapers. Make sure to change them regularly, and always change them right after experiencing incontinence.  General instructions   Take over-the-counter and prescription medicines only as told by your health care provider.   Use the bathroom about every 3-4 hours, even if you do not feel the need to urinate. Try to empty your bladder completely every time. After urinating, wait a minute. Then try to urinate again.   Make sure you are in a relaxed position while urinating.   If your incontinence is caused by nerve problems, keep a log of the medicines you take and the times you go to the bathroom.   Keep all follow-up visits as told by your health care provider. This is important.  Contact a health care provider if:   You have pain that gets worse.     Your incontinence gets worse.  Get help right away if:   You have a fever or chills.   You are unable to urinate.   You have redness in your groin area or down your legs.  Summary   Urinary incontinence refers to a condition in which a person is unable to control where and when to pass urine.   This condition may be caused by medicines, infection, weak bladder muscles, weak pelvic floor muscles, enlargement of the prostate (in men), or surgery.   The following factors increase your risk for developing this condition: older age, obesity, pregnancy and childbirth, menopause, neurological diseases, and chronic coughing.   There are several types of urinary incontinence. They include urge incontinence, stress incontinence, overflow incontinence, and functional incontinence.   This condition is usually treated first with lifestyle and behavioral changes, such as quitting smoking, eating a healthier diet, and doing regular pelvic floor exercises. Other treatment  options include medicines, bulking agents, medical devices, electrical nerve stimulation, or surgery.  This information is not intended to replace advice given to you by your health care provider. Make sure you discuss any questions you have with your health care provider.  Document Released: 09/19/2004 Document Revised: 11/21/2016 Document Reviewed: 11/21/2016  Elsevier Interactive Patient Education  2019 [employer]vier Inc[employer]

## 2018-09-09 NOTE — Progress Notes (Signed)
NP is present today due to bladder issues. Pt stated that she noticed the issues several years ago. Pt stated that she is not able to control her bladder.

## 2018-09-11 ENCOUNTER — Encounter: Admitting: Obstetrics & Gynecology

## 2018-09-13 ENCOUNTER — Encounter: Payer: Self-pay | Admitting: Obstetrics and Gynecology

## 2018-09-14 ENCOUNTER — Other Ambulatory Visit

## 2018-09-17 ENCOUNTER — Ambulatory Visit: Admit: 2018-09-17 | Admitting: Obstetrics & Gynecology

## 2018-09-17 SURGERY — COLPORRHAPHY, ANTERIOR, FOR CYSTOCELE REPAIR
Anesthesia: Choice

## 2018-09-23 ENCOUNTER — Encounter: Admitting: Obstetrics and Gynecology

## 2018-10-21 ENCOUNTER — Ambulatory Visit (INDEPENDENT_AMBULATORY_CARE_PROVIDER_SITE_OTHER): Admitting: Obstetrics and Gynecology

## 2018-10-21 ENCOUNTER — Encounter: Payer: Self-pay | Admitting: Obstetrics and Gynecology

## 2018-10-21 VITALS — BP 137/82 | HR 84 | Ht 70.0 in | Wt 217.0 lb

## 2018-10-21 DIAGNOSIS — N8111 Cystocele, midline: Secondary | ICD-10-CM | POA: Diagnosis not present

## 2018-10-21 DIAGNOSIS — N952 Postmenopausal atrophic vaginitis: Secondary | ICD-10-CM

## 2018-10-21 DIAGNOSIS — N3946 Mixed incontinence: Secondary | ICD-10-CM

## 2018-10-21 NOTE — Progress Notes (Signed)
Pt is present today for insertion of pessary. Pt stated that she was doing well no complaints.

## 2018-10-22 NOTE — Progress Notes (Signed)
    GYNECOLOGY PROGRESS NOTE  Subjective:    Patient ID: Emma Barker, female    DOB: 23-Jan-1966, 53 y.o.   MRN: [id number]  HPI  Patient is a 53 y.o. [id number] female who presents for pessary insertion. She has a history of mixed urinary incontinence (however stress incontinence is more bothersome issue), is s/p remote history of hysterectomy with Burch procedure in 1998.  Also with vaginal atrophy, given samples of Premarin cream last visit.   The following portions of the patient's history were reviewed and updated as appropriate: allergies, current medications, past family history, past medical history, past social history, past surgical history and problem list.  Review of Systems Pertinent items noted in HPI and remainder of comprehensive ROS otherwise negative.   Objective:   Blood pressure 137/82, pulse 84, height 5\' 10"  (1.778 m), weight 217 lb (98.4 kg). General appearance: alert and no distress Abdomen: soft, non-tender; bowel sounds normal; no masses,  no organomegaly Pelvic:   VULVA: normal appearing vulva with no masses, tenderness or lesions,  VAGINA:  cystocele Grade 2, mild vaginal atrophy, no lesions. Weak pelvic floor muscles.   CERVIX: surgically absent,    UTERUS: surgically absent, vaginal cuff well healed, ADNEXA: not examined   Assessment:   Stress incontinence. Severity = moderate. Urge incontinence. Severity = mild.  Grade 2 cystocele  Vaginal atrophy  Plan:   Size 2 incontinence dish with support pessary inserted today.  Patient to f/u in 2-3 weeks. Continue use of Premarin cream for mild atrophy and pessary maintenance. Will give prescription.    Rubie Maid, MD [facility]

## 2018-10-23 MED ORDER — ESTROGENS, CONJUGATED 0.625 MG/GM VA CREA: 1.0000 | TOPICAL_CREAM | VAGINAL | 3 refills | Status: DC

## 2018-11-04 ENCOUNTER — Encounter: Payer: Self-pay | Admitting: Obstetrics and Gynecology

## 2018-11-04 ENCOUNTER — Ambulatory Visit (INDEPENDENT_AMBULATORY_CARE_PROVIDER_SITE_OTHER): Admitting: Obstetrics and Gynecology

## 2018-11-04 ENCOUNTER — Other Ambulatory Visit: Payer: Self-pay

## 2018-11-04 VITALS — BP 109/74 | HR 77 | Ht 70.0 in | Wt 217.4 lb

## 2018-11-04 DIAGNOSIS — Z4689 Encounter for fitting and adjustment of other specified devices: Secondary | ICD-10-CM | POA: Diagnosis not present

## 2018-11-04 DIAGNOSIS — N3945 Continuous leakage: Secondary | ICD-10-CM

## 2018-11-04 DIAGNOSIS — N393 Stress incontinence (female) (male): Secondary | ICD-10-CM | POA: Diagnosis not present

## 2018-11-04 NOTE — Progress Notes (Signed)
Pt is present today for pessary check. Pt stated she is still leaking urine throughout the day.

## 2018-11-04 NOTE — Progress Notes (Signed)
    GYNECOLOGY PROGRESS NOTE  Subjective:    Patient ID: Emma Barker, female    DOB: [birth year], 53 y.o.   MRN: [id number]  HPI  Patient is a 53 y.o. G79P3003 female who presents for 3 week pessary check.  She notes that she has not really felt much of a difference in her symptoms.  Is still having stress incontinence, and even times having leakage at rest. She reports doing Kegel exercises regularly.   The following portions of the patient's history were reviewed and updated as appropriate: allergies, current medications, past family history, past medical history, past social history, past surgical history and problem list.  Review of Systems Pertinent items noted in HPI and remainder of comprehensive ROS otherwise negative.   Objective:   Blood pressure 109/74, pulse 77, height 5\' 10"  (1.778 m), weight 217 lb 6.4 oz (98.6 kg). General appearance: alert and no distress Abdomen: soft, non-tender; bowel sounds normal; no masses,  no organomegaly Pelvic: Size 2 incontinence dish with support removed, cleaned today without complications. Speculum examination revealed normal vaginal mucosa with no lesions or lacerations.   Assessment:   Pessary maintenance Continuous leakage of urine Stress incontinence  Plan:   - Changed patient's incontinence dish to a Size 2 1/2 incontinence ring.  Also advised on trial of Azo bladder control to see if this will help. Advised to continue Kegel exercises.  - Will follow up again in 3 weeks to reassess symptoms.   Rubie Maid, MD [facility]

## 2018-11-27 ENCOUNTER — Ambulatory Visit (INDEPENDENT_AMBULATORY_CARE_PROVIDER_SITE_OTHER): Admitting: Obstetrics and Gynecology

## 2018-11-27 ENCOUNTER — Other Ambulatory Visit: Payer: Self-pay

## 2018-11-27 ENCOUNTER — Encounter: Payer: Self-pay | Admitting: Obstetrics and Gynecology

## 2018-11-27 VITALS — Ht 70.0 in

## 2018-11-27 DIAGNOSIS — N8111 Cystocele, midline: Secondary | ICD-10-CM | POA: Diagnosis not present

## 2018-11-27 DIAGNOSIS — Z4689 Encounter for fitting and adjustment of other specified devices: Secondary | ICD-10-CM | POA: Diagnosis not present

## 2018-11-27 DIAGNOSIS — N393 Stress incontinence (female) (male): Secondary | ICD-10-CM

## 2018-11-27 NOTE — Progress Notes (Signed)
Telephone visit transferred from front desk. CC, Vitals, meds, and allergies reviewed and updated. Pt stated that her pessary fell out and she can not get it back in. Pt stated that she has tried may times and unable to get it back.

## 2018-11-27 NOTE — Progress Notes (Signed)
Virtual Visit via Telephone Note  I connected with Emma Barker on 11/27/18 at  2:18 PM EDT by telephone and verified that I am speaking with the correct person using two identifiers.   I discussed the limitations, risks, security and privacy concerns of performing an evaluation and management service by telephone and the availability of in person appointments. I also discussed with the patient that there may be a patient responsible charge related to this service. The patient expressed understanding and agreed to proceed.   History of Present Illness: Emma Barker is a 53 y.o. G7P3003 female with cystocele and stress urinary incontinence who was initially scheduled for a 3 week follow up of pessary use, however complains that her pessary fell out after an intense coughing spell several days ago.  She notes that she has been trying to reinsert it on her own, but has had several unsuccessful attempts.     Observations/Objective: Body mass index is 31.19 kg/m. No other vitals taken for this visit.    Assessment and Plan:  Pessary maintenance Cystocele Stress incontinence  Discussed with patient option for having an in-office visit to replace pessary if symptoms are bothersome, otherwise, can defer visit for 1 month and try to place pessary at that time if symptoms are mild.  Patient opts to wait 1 month.  During that time, she will also continue attempts at home to re-insert her pessary.    Follow Up Instructions:    I discussed the assessment and treatment plan with the patient. The patient was provided an opportunity to ask questions and all were answered. The patient agreed with the plan and demonstrated an understanding of the instructions.   The patient was advised to call back or seek an in-person evaluation if the symptoms worsen or if the condition fails to improve as anticipated.  I provided 10 minutes of non-face-to-face time during this encounter.   Rubie Maid, MD

## 2018-12-18 LAB — HM COLONOSCOPY

## 2018-12-25 ENCOUNTER — Ambulatory Visit: Payer: Self-pay | Admitting: Obstetrics and Gynecology

## 2019-03-31 ENCOUNTER — Other Ambulatory Visit: Payer: Self-pay | Admitting: Family Medicine

## 2019-03-31 DIAGNOSIS — R319 Hematuria, unspecified: Secondary | ICD-10-CM

## 2019-04-07 ENCOUNTER — Other Ambulatory Visit: Payer: Self-pay

## 2019-04-07 ENCOUNTER — Ambulatory Visit
Admission: RE | Admit: 2019-04-07 | Discharge: 2019-04-07 | Disposition: A | Discharge status: Home / Self Care | Source: Ambulatory Visit | Attending: Family Medicine | Admitting: Family Medicine

## 2019-04-07 DIAGNOSIS — R319 Hematuria, unspecified: Secondary | ICD-10-CM | POA: Diagnosis present

## 2019-04-07 LAB — POCT I-STAT CREATININE: Creatinine, Ser: 1 mg/dL (ref 0.44–1.00)

## 2019-04-07 MED ORDER — IOHEXOL 300 MG/ML  SOLN
100.0000 mL | Freq: Once | INTRAMUSCULAR | Status: AC | PRN
  Administered 2019-04-07: 100 mL via INTRAVENOUS

## 2019-09-09 DIAGNOSIS — C4492 Squamous cell carcinoma of skin, unspecified: Secondary | ICD-10-CM

## 2019-09-09 HISTORY — DX: Squamous cell carcinoma of skin, unspecified: C44.92

## 2019-12-09 ENCOUNTER — Encounter: Admitting: Dermatology

## 2019-12-09 ENCOUNTER — Other Ambulatory Visit: Payer: Self-pay

## 2019-12-17 ENCOUNTER — Encounter: Payer: Self-pay | Admitting: Obstetrics and Gynecology

## 2019-12-17 ENCOUNTER — Other Ambulatory Visit (HOSPITAL_COMMUNITY)
Admission: RE | Admit: 2019-12-17 | Discharge: 2019-12-17 | Disposition: A | Discharge status: Home / Self Care | Source: Ambulatory Visit | Attending: Obstetrics and Gynecology | Admitting: Obstetrics and Gynecology

## 2019-12-17 ENCOUNTER — Other Ambulatory Visit: Payer: Self-pay

## 2019-12-17 ENCOUNTER — Ambulatory Visit (INDEPENDENT_AMBULATORY_CARE_PROVIDER_SITE_OTHER): Admitting: Obstetrics and Gynecology

## 2019-12-17 VITALS — BP 142/78 | HR 96 | Wt 225.0 lb

## 2019-12-17 DIAGNOSIS — R1032 Left lower quadrant pain: Secondary | ICD-10-CM | POA: Diagnosis not present

## 2019-12-17 DIAGNOSIS — Z124 Encounter for screening for malignant neoplasm of cervix: Secondary | ICD-10-CM

## 2019-12-17 DIAGNOSIS — Z1239 Encounter for other screening for malignant neoplasm of breast: Secondary | ICD-10-CM | POA: Diagnosis not present

## 2019-12-17 DIAGNOSIS — Z01419 Encounter for gynecological examination (general) (routine) without abnormal findings: Secondary | ICD-10-CM | POA: Diagnosis not present

## 2019-12-17 DIAGNOSIS — N393 Stress incontinence (female) (male): Secondary | ICD-10-CM

## 2019-12-17 NOTE — Progress Notes (Signed)
Gynecology Annual Exam  PCP: Remi Haggard, FNP  Chief Complaint:  Chief Complaint  Patient presents with  . Gynecologic Exam    Discuss bladder leakage    History of Present Illness:Patient is a 54 y.o. G3P3003 presents for annual exam. The patient has no complaints today.   LMP: No LMP recorded. Patient has had a hysterectomy. No menses status post prior supracervical hysterectomy in [place].  The patient is sexually active. She denies dyspareunia.  The patient does perform self breast exams.  There is no notable family history of breast cancer in her family.  Aunt with ovarian cancer  The patient wears seatbelts: yes.   The patient has regular exercise: not asked.    The patient denies current symptoms of depression.     The patient is menopausal based on previous lab evaluations per her report.  No significant vasomotor symptoms.  She has had intermitted left lower quadrant pain. Unrelated to activity or GI.  She had testosterone levels drawn at PCP not available for review but these were mildly elevated.  Patient also reports significant SUI which has been progressively worsening.  She reports sling procedure at the time of her hysterectomy, but as I do not have access to her records from [place] unclear if this was a TVT/TOT or some other suspension procedure.  Has to wear pads daily.    Review of Systems: Review of Systems  Constitutional: Negative for chills and fever.  HENT: Negative for congestion.   Respiratory: Negative for cough and shortness of breath.   Cardiovascular: Negative for chest pain and palpitations.  Gastrointestinal: Positive for abdominal pain. Negative for constipation, diarrhea, heartburn, nausea and vomiting.  Genitourinary: Negative for dysuria, frequency and urgency.  Skin: Negative for itching and rash.  Neurological: Negative for dizziness and headaches.  Endo/Heme/Allergies: Negative for polydipsia.  Psychiatric/Behavioral: Negative  for depression.    Past Medical History:  Patient Active Problem List   Diagnosis Date Noted  . Mixed stress and urge urinary incontinence 07/31/2018  . Lichen sclerosus [id number]  . Anxiety and depression 10/11/2015  . Gastro-esophageal reflux disease without esophagitis 07/12/2015  . Acquired hypothyroidism 06/14/2015  . Essential (primary) hypertension 06/14/2015  . Female climacteric state 06/14/2015  . Pure hypercholesterolemia 06/14/2015  . Hypokalemia   . Acute ethmoidal sinusitis   . Facial cellulitis   . SIRS (systemic inflammatory response syndrome) ([place]) 12/11/2014  . Dental infection 12/11/2014  . Leukocytosis 12/11/2014    Past Surgical History:  Past Surgical History:  Procedure Laterality Date  . ABDOMINAL HYSTERECTOMY    . APPENDECTOMY    . CATARACT EXTRACTION W/PHACO Right 03/09/2018   Procedure: CATARACT EXTRACTION PHACO AND INTRAOCULAR LENS PLACEMENT ([place]) RIGHT IVA TOPICAL SYMFONY LENS;  Surgeon: Eulogio Bear, MD;  Location: [facility];  Service: Ophthalmology;  Laterality: Right;  . CATARACT EXTRACTION W/PHACO Left 03/30/2018   Procedure: CATARACT EXTRACTION PHACO AND INTRAOCULAR LENS PLACEMENT ([place]) SYMFONY LENS LEFT;  Surgeon: Eulogio Bear, MD;  Location: [facility];  Service: Ophthalmology;  Laterality: Left;  . CHOLECYSTECTOMY    . IMAGE GUIDED SINUS SURGERY Right 05/16/2015   Procedure: IMAGE GUIDED SINUS SURGERY, RIGHT ENDOSCOPIC MAXILLARY ANTROSTOMY, RIGHT ENDOSCOPIC ANTERIOR ETHMOIDECTOMY AND RIGHT ENDOSCOPIC FRONTAL RECESS EXPLORATION;  Surgeon: Clyde Canterbury, MD;  Location: [facility];  Service: ENT;  Laterality: Right;  GAVE DISK TO CE CE  . POLYPECTOMY      Gynecologic History:  No LMP recorded. Patient has had a  hysterectomy.  Obstetric History: [id number]  Family History:  Family History  Problem Relation Age of Onset  . Stroke Father   . Pancreatic cancer Mother   . Stroke Mother   . Hematuria Daughter      Social History:  Social History   Socioeconomic History  . Marital status: Married    Spouse name: Not on file  . Number of children: Not on file  . Years of education: Not on file  . Highest education level: Not on file  Occupational History  . Not on file  Tobacco Use  . Smoking status: Current Every Day Smoker    Packs/day: 0.75    Years: 25.00    Pack years: 18.75    Last attempt to quit: 12/09/2014    Years since quitting: 5.0  . Smokeless tobacco: Never Used  Substance and Sexual Activity  . Alcohol use: No  . Drug use: No  . Sexual activity: Yes    Birth control/protection: None  Other Topics Concern  . Not on file  Social History Narrative  . Not on file   Social Determinants of Health   Financial Resource Strain:   . Difficulty of Paying Living Expenses:   Food Insecurity:   . Worried About Charity fundraiser in the Last Year:   . Arboriculturist in the Last Year:   Transportation Needs:   . Film/video editor (Medical):   Marland Kitchen Lack of Transportation (Non-Medical):   Physical Activity:   . Days of Exercise per Week:   . Minutes of Exercise per Session:   Stress:   . Feeling of Stress :   Social Connections:   . Frequency of Communication with Friends and Family:   . Frequency of Social Gatherings with Friends and Family:   . Attends Religious Services:   . Active Member of Clubs or Organizations:   . Attends Archivist Meetings:   Marland Kitchen Marital Status:   Intimate Partner Violence:   . Fear of Current or Ex-Partner:   . Emotionally Abused:   Marland Kitchen Physically Abused:   . Sexually Abused:     Allergies:  Allergies  Allergen Reactions  . Contrast Media [Iodinated Diagnostic Agents] Hives and Nausea Only    Medications: Prior to Admission medications   Medication Sig Start Date End Date Taking? Authorizing Provider  cholecalciferol (VITAMIN D) 1000 UNITS tablet Take 5,000 Units by mouth daily.    Yes [provider]    clobetasol cream (TEMOVATE) AB-123456789 % Apply 1 application topically 2 (two) times daily. 07/31/18  Yes Gae Dry, MD  fluticasone (FLONASE) 50 MCG/ACT nasal spray Place 1-2 sprays into both nostrils daily.   Yes [provider]  hydrochlorothiazide (HYDRODIURIL) 25 MG tablet Take 25 mg by mouth daily. 12/13/19  Yes [provider]  losartan (COZAAR) 50 MG tablet Take 50 mg by mouth daily. 12/13/19  Yes [provider]  Magnesium 250 MG TABS Take by mouth daily.   Yes [provider]  metroNIDAZOLE (METROGEL) 0.75 % gel Apply 1 application topically 2 (two) times daily as needed.   Yes [provider]  nitroGLYCERIN (NITROSTAT) 0.4 MG SL tablet nitroglycerin 0.4 mg sublingual tablet 10/11/15  Yes [provider]  PARoxetine (PAXIL) 40 MG tablet Take 20 mg by mouth daily.    Yes [provider]  rosuvastatin (CRESTOR) 20 MG tablet Take 20 mg by mouth at bedtime. 12/09/19  Yes [provider]  aspirin EC 81  MG tablet Take by mouth.    [provider]  hydrochlorothiazide (MICROZIDE) 12.5 MG capsule Take 12.5 mg by mouth daily. Reported on 11/03/2015    [provider]  simvastatin (ZOCOR) 10 MG tablet Take 10 mg by mouth daily. lunchtime    [provider]    Physical Exam Vitals: Blood pressure (!) 142/78, pulse 96, weight 225 lb (102.1 kg).  General: NAD HEENT: normocephalic, anicteric Thyroid: no enlargement, no palpable nodules Pulmonary: No increased work of breathing, CTAB Cardiovascular: RRR, distal pulses 2+ Breast: Breast symmetrical, no tenderness, no palpable nodules or masses, no skin or nipple retraction present, no nipple discharge.  No axillary or supraclavicular lymphadenopathy. Abdomen: NABS, soft, non-tender, non-distended.  Umbilicus without lesions.  No hepatomegaly, splenomegaly or masses palpable. No evidence of hernia  Genitourinary:  External: Normal external female  genitalia.  Normal urethral meatus, normal Bartholin's and Skene's glands.    Vagina: Normal vaginal mucosa, no evidence of prolapse.    Cervix: Grossly normal in appearance, no bleeding  Uterus: surgically asbent  Adnexa: ovaries non-enlarged, no adnexal masses  Rectal: deferred  Lymphatic: no evidence of inguinal lymphadenopathy Extremities: no edema, erythema, or tenderness Neurologic: Grossly intact Psychiatric: mood appropriate, affect full  Female chaperone present for pelvic and breast  portions of the physical exam     Assessment: 55 y.o. G3P3003 routine annual exam  Plan: Problem List Items Addressed This Visit    None    Visit Diagnoses    Encounter for gynecological examination without abnormal finding    -  Primary   Screening for malignant neoplasm of cervix       Relevant Orders   Cytology - PAP   Breast screening       Relevant Orders   MM 3D SCREEN BREAST BILATERAL   Left lower quadrant pain       Relevant Orders   US Transvaginal Non-OB   SUI (stress urinary incontinence, female)       Relevant Orders   Ambulatory referral to Urogynecology      1) Mammogram - recommend yearly screening mammogram.  Mammogram Was ordered today  2) STI screening  was notoffered and declined  3) ASCCP guidelines and rational discussed.  Patient opts for every 3 years screening interval  4) Osteoporosis  - per USPTF routine screening DEXA at age 42 Consider FDA-approved medical therapies in postmenopausal women and men aged 33 years and older, based on the following: a) A hip or vertebral (clinical or morphometric) fracture b) T-score ? -2.5 at the femoral neck or spine after appropriate evaluation to exclude secondary causes C) Low bone mass (T-score between -1.0 and -2.5 at the femoral neck or spine) and a 10-year probability of a hip fracture ? 3% or a 10-year probability of a major osteoporosis-related fracture ? 20% based on the [place]-adapted WHO algorithm   5)  Routine healthcare maintenance including cholesterol, diabetes screening discussed managed by PCP  6) Colonoscopy - UTD   7) SUI - uro-ogyn referral  8) Vaginal atrophy - premarin cream vaginal atrophy  9) Pelvic pain - TVUS for LLQ pelvic pain aunt with ovarian cancer, reports mildly elevated testosterone levels at PCP  10) Return in about 1 week (around 12/24/2019) for 1-2 weeks TVUS and follow up.    Malachy Mood, MD [facility], [facility] 12/17/2019, 3:26 PM

## 2019-12-17 NOTE — Patient Instructions (Signed)
[facility] [address] [place] [address]  [facility]  [address] [place] [address]  Phone: [phone]  

## 2019-12-21 LAB — CYTOLOGY - PAP
Comment: NEGATIVE
Diagnosis: NEGATIVE
High risk HPV: NEGATIVE

## 2019-12-26 ENCOUNTER — Other Ambulatory Visit: Payer: Self-pay | Admitting: Obstetrics & Gynecology

## 2020-01-06 ENCOUNTER — Ambulatory Visit (INDEPENDENT_AMBULATORY_CARE_PROVIDER_SITE_OTHER): Admitting: Obstetrics and Gynecology

## 2020-01-06 ENCOUNTER — Encounter: Payer: Self-pay | Admitting: Obstetrics and Gynecology

## 2020-01-06 ENCOUNTER — Ambulatory Visit (INDEPENDENT_AMBULATORY_CARE_PROVIDER_SITE_OTHER)

## 2020-01-06 ENCOUNTER — Other Ambulatory Visit: Payer: Self-pay

## 2020-01-06 VITALS — BP 120/80 | Ht 70.0 in | Wt 223.0 lb

## 2020-01-06 DIAGNOSIS — R102 Pelvic and perineal pain: Secondary | ICD-10-CM

## 2020-01-06 DIAGNOSIS — R1032 Left lower quadrant pain: Secondary | ICD-10-CM | POA: Diagnosis not present

## 2020-01-06 NOTE — Progress Notes (Signed)
Gynecology Ultrasound Follow Up  Chief Complaint:  Chief Complaint  Patient presents with  . Follow-up     History of Present Illness: Patient is a 54 y.o. female who presents today for ultrasound evaluation of pelvic pain  Ultrasound demonstrates the following findgins Adnexa: image normally without masses or lesions Uterus: surgically absent Additional: No free fluid  Review of Systems: Review of Systems  Constitutional: Negative.   Gastrointestinal: Positive for abdominal pain. Negative for blood in stool, constipation, diarrhea, heartburn, melena, nausea and vomiting.  Genitourinary: Positive for frequency. Negative for dysuria, flank pain, hematuria and urgency.    Past Medical History:  Past Medical History:  Diagnosis Date  . Anxiety    agoraphobia  . Arthritis    osteo - everywhere  . Chest pain    related to thyroid issues  . GERD (gastroesophageal reflux disease)   . Graves disease   . Graves disease   . Graves' disease   . Graves' disease   . Headache    sinus  . Hereditary pancreatitis   . Hypertension   . Hypothyroid   . Lichen sclerosus of female genitalia   . Mitral valve regurgitation   . PONV (postoperative nausea and vomiting)   . Sleep apnea    supposed to use CPAP - no machine  . Squamous cell carcinoma of skin 09/09/2019  . Stress incontinence   . Tricuspid valve regurgitation   . Von Willebrand's disease ([place])   . Wears contact lenses     Past Surgical History:  Past Surgical History:  Procedure Laterality Date  . ABDOMINAL HYSTERECTOMY    . APPENDECTOMY    . CATARACT EXTRACTION W/PHACO Right 03/09/2018   Procedure: CATARACT EXTRACTION PHACO AND INTRAOCULAR LENS PLACEMENT ([place]) RIGHT IVA TOPICAL SYMFONY LENS;  Surgeon: Eulogio Bear, MD;  Location: [facility];  Service: Ophthalmology;  Laterality: Right;  . CATARACT EXTRACTION W/PHACO Left 03/30/2018   Procedure: CATARACT EXTRACTION PHACO AND INTRAOCULAR LENS PLACEMENT  ([place]) SYMFONY LENS LEFT;  Surgeon: Eulogio Bear, MD;  Location: [facility];  Service: Ophthalmology;  Laterality: Left;  . CHOLECYSTECTOMY    . IMAGE GUIDED SINUS SURGERY Right 05/16/2015   Procedure: IMAGE GUIDED SINUS SURGERY, RIGHT ENDOSCOPIC MAXILLARY ANTROSTOMY, RIGHT ENDOSCOPIC ANTERIOR ETHMOIDECTOMY AND RIGHT ENDOSCOPIC FRONTAL RECESS EXPLORATION;  Surgeon: Clyde Canterbury, MD;  Location: [facility];  Service: ENT;  Laterality: Right;  GAVE DISK TO CE CE  . POLYPECTOMY      Gynecologic History:  No LMP recorded. Patient has had a hysterectomy.   Family History:  Family History  Problem Relation Age of Onset  . Stroke Father   . Pancreatic cancer Mother   . Stroke Mother   . Hematuria Daughter     Social History:  Social History   Socioeconomic History  . Marital status: Married    Spouse name: Not on file  . Number of children: Not on file  . Years of education: Not on file  . Highest education level: Not on file  Occupational History  . Not on file  Tobacco Use  . Smoking status: Current Every Day Smoker    Packs/day: 0.75    Years: 25.00    Pack years: 18.75    Last attempt to quit: 12/09/2014    Years since quitting: 5.0  . Smokeless tobacco: Never Used  Substance and Sexual Activity  . Alcohol use: No  . Drug use: No  . Sexual activity: Yes  Birth control/protection: None  Other Topics Concern  . Not on file  Social History Narrative  . Not on file   Social Determinants of Health   Financial Resource Strain:   . Difficulty of Paying Living Expenses:   Food Insecurity:   . Worried About Charity fundraiser in the Last Year:   . Arboriculturist in the Last Year:   Transportation Needs:   . Film/video editor (Medical):   Marland Kitchen Lack of Transportation (Non-Medical):   Physical Activity:   . Days of Exercise per Week:   . Minutes of Exercise per Session:   Stress:   . Feeling of Stress :   Social Connections:   . Frequency of  Communication with Friends and Family:   . Frequency of Social Gatherings with Friends and Family:   . Attends Religious Services:   . Active Member of Clubs or Organizations:   . Attends Archivist Meetings:   Marland Kitchen Marital Status:   Intimate Partner Violence:   . Fear of Current or Ex-Partner:   . Emotionally Abused:   Marland Kitchen Physically Abused:   . Sexually Abused:     Allergies:  Allergies  Allergen Reactions  . Contrast Media [Iodinated Diagnostic Agents] Hives and Nausea Only    Medications: Prior to Admission medications   Medication Sig Start Date End Date Taking? Authorizing Provider  aspirin EC 81 MG tablet Take by mouth.   Yes [provider]  cholecalciferol (VITAMIN D) 1000 UNITS tablet Take 5,000 Units by mouth daily.    Yes [provider]  clobetasol cream (TEMOVATE) [id number] % Apply 1 application topically 2 (two) times daily. 07/31/18  Yes Gae Dry, MD  fluticasone (FLONASE) 50 MCG/ACT nasal spray Place 1-2 sprays into both nostrils daily.   Yes [provider]  hydrochlorothiazide (HYDRODIURIL) 25 MG tablet Take 25 mg by mouth daily. 12/13/19  Yes [provider]  losartan (COZAAR) 50 MG tablet Take 50 mg by mouth daily. 12/13/19  Yes [provider]  Magnesium 250 MG TABS Take by mouth daily.   Yes [provider]  metroNIDAZOLE (METROGEL) 0.75 % gel Apply 1 application topically 2 (two) times daily as needed.   Yes [provider]  nitroGLYCERIN (NITROSTAT) 0.4 MG SL tablet nitroglycerin 0.4 mg sublingual tablet 10/11/15  Yes [provider]  PARoxetine (PAXIL) 40 MG tablet Take 20 mg by mouth daily.    Yes [provider]  rosuvastatin (CRESTOR) 20 MG tablet Take 20 mg by mouth at bedtime. 12/09/19  Yes [provider]  hydrochlorothiazide (MICROZIDE) 12.5 MG capsule Take 12.5 mg by mouth daily. Reported on 11/03/2015    [provider]  simvastatin (ZOCOR) 10 MG  tablet Take 10 mg by mouth daily. lunchtime    [provider]    Physical Exam Vitals: Blood pressure 120/80, height 5\' 10"  (1.778 m), weight 223 lb (101.2 kg).  General: NAD, well nourished appears stated age 16: normocephalic, anicteric Pulmonary: No increased work of breathing Neurologic: Grossly intact, normal gait Psychiatric: mood appropriate, affect full  US Transvaginal Non-OB  Result Date: 01/06/2020 Patient Name: Emma Barker DOB: [birth year] MRN: [id number] ULTRASOUND REPORT Location: [facility] Date of Service: 01/06/2020 Indications:Pelvic Pain Findings: The uterus and cervix are absent.  The right ovary measures 2.0 x 1.1 x 0.6 cm. It is normal in appearance. The left ovary measures 2.5 x 1.2 x 1.7 cm. It is normal in appearance. Survey of the adnexa  demonstrates no adnexal masses. There is no free fluid in the cul de sac. Impression: 1. The uterus and cervix are absent. 2. Normal adnexa bilaterally. Recommendations: 1.Clinical correlation with the patient's History and Physical Exam. Gweneth Dimitri, RT Images reviewed.  Normal GYN study without visualized pathology.  Malachy Mood, MD, [facility], [facility] 01/06/2020, 11:39 AM    Assessment: 54 y.o. G3P3003 follow up ultrasound pelvic pain  Plan: Problem List Items Addressed This Visit    None    Visit Diagnoses    Pelvic pain in female    -  Primary      1) Ultrasound today reveals no adnexal masses or cysts.  Reassurance provided given family history of ovarian cancer.    2) Urinary incontinence - has urogynecology follow up tomorrow  3) A total of 15 minutes were spent in face-to-face contact with the patient during this encounter with over half of that time devoted to counseling and coordination of care.  4) Return in about 1 year (around 01/05/2021) for annual.    Malachy Mood, MD, [place], [facility] 01/06/2020, 11:50  AM

## 2020-01-07 ENCOUNTER — Ambulatory Visit: Admitting: Obstetrics and Gynecology

## 2020-01-07 ENCOUNTER — Ambulatory Visit

## 2020-02-23 ENCOUNTER — Ambulatory Visit (INDEPENDENT_AMBULATORY_CARE_PROVIDER_SITE_OTHER): Admitting: Dermatology

## 2020-02-23 ENCOUNTER — Encounter: Payer: Self-pay | Admitting: Dermatology

## 2020-02-23 ENCOUNTER — Other Ambulatory Visit: Payer: Self-pay

## 2020-02-23 DIAGNOSIS — L905 Scar conditions and fibrosis of skin: Secondary | ICD-10-CM | POA: Diagnosis not present

## 2020-02-23 DIAGNOSIS — Z85828 Personal history of other malignant neoplasm of skin: Secondary | ICD-10-CM

## 2020-02-23 DIAGNOSIS — D1800 Hemangioma unspecified site: Secondary | ICD-10-CM

## 2020-02-23 DIAGNOSIS — L821 Other seborrheic keratosis: Secondary | ICD-10-CM

## 2020-02-23 DIAGNOSIS — L573 Poikiloderma of Civatte: Secondary | ICD-10-CM

## 2020-02-23 DIAGNOSIS — Z1283 Encounter for screening for malignant neoplasm of skin: Secondary | ICD-10-CM | POA: Diagnosis not present

## 2020-02-23 DIAGNOSIS — L719 Rosacea, unspecified: Secondary | ICD-10-CM

## 2020-02-23 DIAGNOSIS — D220 Melanocytic nevi of lip: Secondary | ICD-10-CM | POA: Diagnosis not present

## 2020-02-23 DIAGNOSIS — L578 Other skin changes due to chronic exposure to nonionizing radiation: Secondary | ICD-10-CM

## 2020-02-23 DIAGNOSIS — D485 Neoplasm of uncertain behavior of skin: Secondary | ICD-10-CM

## 2020-02-23 DIAGNOSIS — L720 Epidermal cyst: Secondary | ICD-10-CM

## 2020-02-23 DIAGNOSIS — D229 Melanocytic nevi, unspecified: Secondary | ICD-10-CM

## 2020-02-23 DIAGNOSIS — L814 Other melanin hyperpigmentation: Secondary | ICD-10-CM

## 2020-02-23 NOTE — Patient Instructions (Signed)
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
## 2020-03-06 ENCOUNTER — Telehealth: Payer: Self-pay

## 2020-03-06 ENCOUNTER — Encounter: Payer: Self-pay | Admitting: Dermatology

## 2020-03-06 NOTE — Telephone Encounter (Signed)
-----   Message from Ralene Bathe, MD sent at 03/05/2020  7:18 PM EDT ----- Skin , right upper lip MELANOCYTIC NEVUS, INTRADERMAL TYPE  Benign mole

## 2020-03-06 NOTE — Telephone Encounter (Signed)
Advised patient of pathology results.

## 2020-08-23 ENCOUNTER — Emergency Department
Admission: EM | Admit: 2020-08-23 | Discharge: 2020-08-23 | Disposition: A | Discharge status: Home / Self Care | Attending: Emergency Medicine | Admitting: Emergency Medicine

## 2020-08-23 ENCOUNTER — Other Ambulatory Visit: Payer: Self-pay

## 2020-08-23 DIAGNOSIS — M549 Dorsalgia, unspecified: Secondary | ICD-10-CM | POA: Diagnosis not present

## 2020-08-23 DIAGNOSIS — U071 COVID-19: Secondary | ICD-10-CM | POA: Insufficient documentation

## 2020-08-23 DIAGNOSIS — R079 Chest pain, unspecified: Secondary | ICD-10-CM | POA: Diagnosis present

## 2020-08-23 DIAGNOSIS — Z5321 Procedure and treatment not carried out due to patient leaving prior to being seen by health care provider: Secondary | ICD-10-CM | POA: Insufficient documentation

## 2020-08-23 LAB — RESP PANEL BY RT-PCR (FLU A&B, COVID) ARPGX2
Influenza A by PCR: NEGATIVE
Influenza B by PCR: NEGATIVE
SARS Coronavirus 2 by RT PCR: POSITIVE — AB

## 2020-08-23 LAB — CBC
HCT: 43.6 % (ref 36.0–46.0)
Hemoglobin: 14.9 g/dL (ref 12.0–15.0)
MCH: 31.1 pg (ref 26.0–34.0)
MCHC: 34.2 g/dL (ref 30.0–36.0)
MCV: 91 fL (ref 80.0–100.0)
Platelets: 235 10*3/uL (ref 150–400)
RBC: 4.79 MIL/uL (ref 3.87–5.11)
RDW: 13.4 % (ref 11.5–15.5)
WBC: 6 10*3/uL (ref 4.0–10.5)
nRBC: 0 % (ref 0.0–0.2)

## 2020-08-23 LAB — BASIC METABOLIC PANEL
Anion gap: 9 (ref 5–15)
BUN: 14 mg/dL (ref 6–20)
CO2: 25 mmol/L (ref 22–32)
Calcium: 9.3 mg/dL (ref 8.9–10.3)
Chloride: 102 mmol/L (ref 98–111)
Creatinine, Ser: 0.75 mg/dL (ref 0.44–1.00)
GFR, Estimated: >60 mL/min (ref >60)
Glucose, Bld: 107 mg/dL — ABNORMAL HIGH (ref 70–99)
Potassium: 3 mmol/L — ABNORMAL LOW (ref 3.5–5.1)
Sodium: 136 mmol/L (ref 135–145)

## 2020-08-23 LAB — TROPONIN I (HIGH SENSITIVITY): Troponin I (High Sensitivity): 3 ng/L (ref <18)

## 2020-08-24 ENCOUNTER — Ambulatory Visit (HOSPITAL_COMMUNITY)
Admission: RE | Admit: 2020-08-24 | Discharge: 2020-08-24 | Disposition: A | Discharge status: Home / Self Care | Source: Ambulatory Visit | Attending: Pulmonary Disease | Admitting: Pulmonary Disease

## 2020-08-24 ENCOUNTER — Other Ambulatory Visit: Payer: Self-pay | Admitting: Unknown Physician Specialty

## 2020-08-24 ENCOUNTER — Telehealth: Payer: Self-pay | Admitting: Unknown Physician Specialty

## 2020-08-24 DIAGNOSIS — I10 Essential (primary) hypertension: Secondary | ICD-10-CM | POA: Diagnosis present

## 2020-08-24 MED ORDER — METHYLPREDNISOLONE SODIUM SUCC 125 MG IJ SOLR: 125.0000 mg | Freq: Once | INTRAMUSCULAR | Status: DC | PRN

## 2020-08-24 MED ORDER — FAMOTIDINE IN NACL 20-0.9 MG/50ML-% IV SOLN: 20.0000 mg | Freq: Once | INTRAVENOUS | Status: DC | PRN

## 2020-08-24 MED ORDER — SODIUM CHLORIDE 0.9 % IV SOLN: INTRAVENOUS | Status: DC | PRN

## 2020-08-24 MED ORDER — EPINEPHRINE 0.3 MG/0.3ML IJ SOAJ: 0.3000 mg | Freq: Once | INTRAMUSCULAR | Status: DC | PRN

## 2020-08-24 MED ORDER — DIPHENHYDRAMINE HCL 50 MG/ML IJ SOLN: 50.0000 mg | Freq: Once | INTRAMUSCULAR | Status: DC | PRN

## 2020-08-24 MED ORDER — ALBUTEROL SULFATE HFA 108 (90 BASE) MCG/ACT IN AERS: 2.0000 | INHALATION_SPRAY | Freq: Once | RESPIRATORY_TRACT | Status: DC | PRN

## 2020-08-24 MED ORDER — SODIUM CHLORIDE 0.9 % IV SOLN: Freq: Once | INTRAVENOUS | Status: AC

## 2020-08-24 NOTE — Progress Notes (Signed)
Patient reviewed Fact Sheet for Patients, Parents, and Caregivers for Emergency Use Authorization (EUA) of bamlanivimab and etesevimab for the Treatment of Coronavirus. Patient also reviewed and is agreeable to the estimated cost of treatment. Patient is agreeable to proceed.   

## 2020-08-24 NOTE — Discharge Instructions (Signed)
COVID-19 COVID-19 is a respiratory infection that is caused by a virus called severe acute respiratory syndrome coronavirus 2 (SARS-CoV-2). The disease is also known as coronavirus disease or novel coronavirus. In some people, the virus may not cause any symptoms. In others, it may cause a serious infection. The infection can get worse quickly and can lead to complications, such as:  Pneumonia, or infection of the lungs.  Acute respiratory distress syndrome or ARDS. This is a condition in which fluid build-up in the lungs prevents the lungs from filling with air and passing oxygen into the blood.  Acute respiratory failure. This is a condition in which there is not enough oxygen passing from the lungs to the body or when carbon dioxide is not passing from the lungs out of the body.  Sepsis or septic shock. This is a serious bodily reaction to an infection.  Blood clotting problems.  Secondary infections due to bacteria or fungus.  Organ failure. This is when your body's organs stop working. The virus that causes COVID-19 is contagious. This means that it can spread from person to person through droplets from coughs and sneezes (respiratory secretions). What are the causes? This illness is caused by a virus. You may catch the virus by:  Breathing in droplets from an infected person. Droplets can be spread by a person breathing, speaking, singing, coughing, or sneezing.  Touching something, like a table or a doorknob, that was exposed to the virus (contaminated) and then touching your mouth, nose, or eyes. What increases the risk? Risk for infection You are more likely to be infected with this virus if you:  Are within 6 feet (2 meters) of a person with COVID-19.  Provide care for or live with a person who is infected with COVID-19.  Spend time in crowded indoor spaces or live in shared housing. Risk for serious illness You are more likely to become seriously ill from the virus if  you:  Are 50 years of age or older. The higher your age, the more you are at risk for serious illness.  Live in a nursing home or long-term care facility.  Have cancer.  Have a long-term (chronic) disease such as: ? Chronic lung disease, including chronic obstructive pulmonary disease or asthma. ? A long-term disease that lowers your body's ability to fight infection (immunocompromised). ? Heart disease, including heart failure, a condition in which the arteries that lead to the heart become narrow or blocked (coronary artery disease), a disease which makes the heart muscle thick, weak, or stiff (cardiomyopathy). ? Diabetes. ? Chronic kidney disease. ? Sickle cell disease, a condition in which red blood cells have an abnormal "sickle" shape. ? Liver disease.  Are obese. What are the signs or symptoms? Symptoms of this condition can range from mild to severe. Symptoms may appear any time from 2 to 14 days after being exposed to the virus. They include:  A fever or chills.  A cough.  Difficulty breathing.  Headaches, body aches, or muscle aches.  Runny or stuffy (congested) nose.  A sore throat.  New loss of taste or smell. Some people may also have stomach problems, such as nausea, vomiting, or diarrhea. Other people may not have any symptoms of COVID-19. How is this diagnosed? This condition may be diagnosed based on:  Your signs and symptoms, especially if: ? You live in an area with a COVID-19 outbreak. ? You recently traveled to or from an area where the virus is common. ? You   provide care for or live with a person who was diagnosed with COVID-19. ? You were exposed to a person who was diagnosed with COVID-19.  A physical exam.  Lab tests, which may include: ? Taking a sample of fluid from the back of your nose and throat (nasopharyngeal fluid), your nose, or your throat using a swab. ? A sample of mucus from your lungs (sputum). ? Blood tests.  Imaging tests,  which may include, X-rays, CT scan, or ultrasound. How is this treated? At present, there is no medicine to treat COVID-19. Medicines that treat other diseases are being used on a trial basis to see if they are effective against COVID-19. Your health care provider will talk with you about ways to treat your symptoms. For most people, the infection is mild and can be managed at home with rest, fluids, and over-the-counter medicines. Treatment for a serious infection usually takes places in a hospital intensive care unit (ICU). It may include one or more of the following treatments. These treatments are given until your symptoms improve.  Receiving fluids and medicines through an IV.  Supplemental oxygen. Extra oxygen is given through a tube in the nose, a face mask, or a hood.  Positioning you to lie on your stomach (prone position). This makes it easier for oxygen to get into the lungs.  Continuous positive airway pressure (CPAP) or bi-level positive airway pressure (BPAP) machine. This treatment uses mild air pressure to keep the airways open. A tube that is connected to a motor delivers oxygen to the body.  Ventilator. This treatment moves air into and out of the lungs by using a tube that is placed in your windpipe.  Tracheostomy. This is a procedure to create a hole in the neck so that a breathing tube can be inserted.  Extracorporeal membrane oxygenation (ECMO). This procedure gives the lungs a chance to recover by taking over the functions of the heart and lungs. It supplies oxygen to the body and removes carbon dioxide. Follow these instructions at home: Lifestyle  If you are sick, stay home except to get medical care. Your health care provider will tell you how long to stay home. Call your health care provider before you go for medical care.  Rest at home as told by your health care provider.  Do not use any products that contain nicotine or tobacco, such as cigarettes,  e-cigarettes, and chewing tobacco. If you need help quitting, ask your health care provider.  Return to your normal activities as told by your health care provider. Ask your health care provider what activities are safe for you. General instructions  Take over-the-counter and prescription medicines only as told by your health care provider.  Drink enough fluid to keep your urine pale yellow.  Keep all follow-up visits as told by your health care provider. This is important. How is this prevented?  There is no vaccine to help prevent COVID-19 infection. However, there are steps you can take to protect yourself and others from this virus. To protect yourself:   Do not travel to areas where COVID-19 is a risk. The areas where COVID-19 is reported change often. To identify high-risk areas and travel restrictions, check the CDC travel website: wwwnc.cdc.gov/travel/notices  If you live in, or must travel to, an area where COVID-19 is a risk, take precautions to avoid infection. ? Stay away from people who are sick. ? Wash your hands often with soap and water for 20 seconds. If soap and water   are not available, use an alcohol-based hand sanitizer. ? Avoid touching your mouth, face, eyes, or nose. ? Avoid going out in public, follow guidance from your state and local health authorities. ? If you must go out in public, wear a cloth face covering or face mask. Make sure your mask covers your nose and mouth. ? Avoid crowded indoor spaces. Stay at least 6 feet (2 meters) away from others. ? Disinfect objects and surfaces that are frequently touched every day. This may include:  Counters and tables.  Doorknobs and light switches.  Sinks and faucets.  Electronics, such as phones, remote controls, keyboards, computers, and tablets. To protect others: If you have symptoms of COVID-19, take steps to prevent the virus from spreading to others.  If you think you have a COVID-19 infection, contact  your health care provider right away. Tell your health care team that you think you may have a COVID-19 infection.  Stay home. Leave your house only to seek medical care. Do not use public transport.  Do not travel while you are sick.  Wash your hands often with soap and water for 20 seconds. If soap and water are not available, use alcohol-based hand sanitizer.  Stay away from other members of your household. Let healthy household members care for children and pets, if possible. If you have to care for children or pets, wash your hands often and wear a mask. If possible, stay in your own room, separate from others. Use a different bathroom.  Make sure that all people in your household wash their hands well and often.  Cough or sneeze into a tissue or your sleeve or elbow. Do not cough or sneeze into your hand or into the air.  Wear a cloth face covering or face mask. Make sure your mask covers your nose and mouth. Where to find more information  Centers for Disease Control and Prevention: www.cdc.gov/coronavirus/2019-ncov/index.html  World Health Organization: www.who.int/health-topics/coronavirus Contact a health care provider if:  You live in or have traveled to an area where COVID-19 is a risk and you have symptoms of the infection.  You have had contact with someone who has COVID-19 and you have symptoms of the infection. Get help right away if:  You have trouble breathing.  You have pain or pressure in your chest.  You have confusion.  You have bluish lips and fingernails.  You have difficulty waking from sleep.  You have symptoms that get worse. These symptoms may represent a serious problem that is an emergency. Do not wait to see if the symptoms will go away. Get medical help right away. Call your local emergency services (911 in the [place] Do not drive yourself to the hospital. Let the emergency medical personnel know if you think you have  COVID-19. Summary  COVID-19 is a respiratory infection that is caused by a virus. It is also known as coronavirus disease or novel coronavirus. It can cause serious infections, such as pneumonia, acute respiratory distress syndrome, acute respiratory failure, or sepsis.  The virus that causes COVID-19 is contagious. This means that it can spread from person to person through droplets from breathing, speaking, singing, coughing, or sneezing.  You are more likely to develop a serious illness if you are 50 years of age or older, have a weak immune system, live in a nursing home, or have chronic disease.  There is no medicine to treat COVID-19. Your health care provider will talk with you about ways to treat your symptoms.    Take steps to protect yourself and others from infection. Wash your hands often and disinfect objects and surfaces that are frequently touched every day. Stay away from people who are sick and wear a mask if you are sick. This information is not intended to replace advice given to you by your health care provider. Make sure you discuss any questions you have with your health care provider. Document Revised: 06/11/2019 Document Reviewed: 09/17/2018 Elsevier Patient Education  2020 [employer] What types of side effects do monoclonal antibody drugs cause?  Common side effects  In general, the more common side effects caused by monoclonal antibody drugs include: . Allergic reactions, such as hives or itching . Flu-like signs and symptoms, including chills, fatigue, fever, and muscle aches and pains . Nausea, vomiting . Diarrhea . Skin rashes . Low blood pressure   The CDC is recommending patients who receive monoclonal antibody treatments wait at least 90 days before being vaccinated.  Currently, there are no data on the safety and efficacy of mRNA COVID-19 vaccines in persons who received monoclonal antibodies or convalescent plasma as part of COVID-19 treatment. Based  on the estimated half-life of such therapies as well as evidence suggesting that reinfection is uncommon in the 90 days after initial infection, vaccination should be deferred for at least 90 days, as a precautionary measure until additional information becomes available, to avoid interference of the antibody treatment with vaccine-induced immune responses. If you have any questions or concerns after the infusion please call the [facility] Provider on call at [phone]. This number is ONLY intended for your use regarding questions or concerns about the infusion post-treatment side-effects.  Please do not provide this number to others for use. For return to work notes please contact your primary care provider.   If someone you know is interested in receiving treatment please have them call the COVID hotline at [phone].   

## 2020-08-24 NOTE — Telephone Encounter (Signed)
I connected by phone with Emma Barker on 08/24/2020 at 9:31 AM to discuss the potential use of a new treatment for mild to moderate COVID-19 viral infection in non-hospitalized patients.  This patient is a 54 y.o. female that meets the FDA criteria for Emergency Use Authorization of COVID monoclonal antibody casirivimab/imdevimab, bamlanivimab/etesevimab, or sotrovimab.  Has a (+) direct SARS-CoV-2 viral test result  Has mild or moderate COVID-19   Is NOT hospitalized due to COVID-19  Is within 10 days of symptom onset  Has at least one of the high risk factor(s) for progression to severe COVID-19 and/or hospitalization as defined in EUA.  Specific high risk criteria : BMI > 25   I have spoken and communicated the following to the patient or parent/caregiver regarding COVID monoclonal antibody treatment:  1. FDA has authorized the emergency use for the treatment of mild to moderate COVID-19 in adults and pediatric patients with positive results of direct SARS-CoV-2 viral testing who are 30 years of age and older weighing at least 40 kg, and who are at high risk for progressing to severe COVID-19 and/or hospitalization.  2. The significant known and potential risks and benefits of COVID monoclonal antibody, and the extent to which such potential risks and benefits are unknown.  3. Information on available alternative treatments and the risks and benefits of those alternatives, including clinical trials.  4. Patients treated with COVID monoclonal antibody should continue to self-isolate and use infection control measures (e.g., wear mask, isolate, social distance, avoid sharing personal items, clean and disinfect "high touch" surfaces, and frequent handwashing) according to CDC guidelines.   5. The patient or parent/caregiver has the option to accept or refuse COVID monoclonal antibody treatment.  After reviewing this information with the patient, the patient has agreed to receive  one of the available covid 19 monoclonal antibodies and will be provided an appropriate fact sheet prior to infusion. Gabriel Cirri, NP 08/24/2020 9:31 AM  Sx onset 12/26

## 2020-10-11 ENCOUNTER — Other Ambulatory Visit: Payer: Self-pay

## 2020-10-11 ENCOUNTER — Ambulatory Visit (INDEPENDENT_AMBULATORY_CARE_PROVIDER_SITE_OTHER): Admitting: Dermatology

## 2020-10-11 ENCOUNTER — Encounter: Payer: Self-pay | Admitting: Dermatology

## 2020-10-11 DIAGNOSIS — D229 Melanocytic nevi, unspecified: Secondary | ICD-10-CM

## 2020-10-11 DIAGNOSIS — L900 Lichen sclerosus et atrophicus: Secondary | ICD-10-CM | POA: Diagnosis not present

## 2020-10-11 DIAGNOSIS — L814 Other melanin hyperpigmentation: Secondary | ICD-10-CM

## 2020-10-11 DIAGNOSIS — L821 Other seborrheic keratosis: Secondary | ICD-10-CM

## 2020-10-11 DIAGNOSIS — L578 Other skin changes due to chronic exposure to nonionizing radiation: Secondary | ICD-10-CM

## 2020-10-11 DIAGNOSIS — Z1283 Encounter for screening for malignant neoplasm of skin: Secondary | ICD-10-CM | POA: Diagnosis not present

## 2020-10-11 DIAGNOSIS — D1800 Hemangioma unspecified site: Secondary | ICD-10-CM

## 2020-10-11 DIAGNOSIS — L905 Scar conditions and fibrosis of skin: Secondary | ICD-10-CM | POA: Diagnosis not present

## 2020-10-11 NOTE — Progress Notes (Signed)
   Follow-Up Visit   Subjective  Emma Barker is a 55 y.o. female who presents for the following: Annual Exam (Mole check ). The patient presents for Upper Body Skin Exam (UBSE) for skin cancer screening and mole check. 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  Objective  Well appearing patient in no apparent distress; mood and affect are within normal limits.  All skin waist up examined.  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 - Observe - Call for any changes  Melanocytic Nevi - Tan-brown and/or pink-flesh-colored symmetric macules and papules - Benign appearing on exam today - Observation - Call clinic for new or changing moles - Recommend daily use of broad spectrum spf 30+ sunscreen to sun-exposed areas.   Hemangiomas - Red papules - Discussed benign nature - Observe - Call for any changes  Actinic Damage - Chronic, secondary to cumulative UV/sun exposure - diffuse scaly erythematous macules with underlying dyspigmentation - Recommend daily broad spectrum sunscreen SPF 30+ to  sun-exposed areas, reapply every 2 hours as needed.  - Call for new or changing lesions.  Skin cancer screening performed today.  Return in about 3 months (around 01/08/2021) for recheck spot on the R breast.  I, Marye Round, CMA, am acting as scribe for Sarina Ser, MD .  Documentation: I have reviewed the above documentation for accuracy and completeness, and I agree with the above.  Sarina Ser, MD

## 2020-10-11 NOTE — Patient Instructions (Signed)

## 2021-01-10 ENCOUNTER — Other Ambulatory Visit: Payer: Self-pay

## 2021-01-10 ENCOUNTER — Ambulatory Visit: Admitting: Dermatology

## 2021-01-10 DIAGNOSIS — D1800 Hemangioma unspecified site: Secondary | ICD-10-CM

## 2021-01-10 DIAGNOSIS — D2239 Melanocytic nevi of other parts of face: Secondary | ICD-10-CM

## 2021-01-10 DIAGNOSIS — D485 Neoplasm of uncertain behavior of skin: Secondary | ICD-10-CM

## 2021-01-10 DIAGNOSIS — L821 Other seborrheic keratosis: Secondary | ICD-10-CM

## 2021-01-10 DIAGNOSIS — D229 Melanocytic nevi, unspecified: Secondary | ICD-10-CM

## 2021-01-10 DIAGNOSIS — D2339 Other benign neoplasm of skin of other parts of face: Secondary | ICD-10-CM

## 2021-01-10 DIAGNOSIS — L578 Other skin changes due to chronic exposure to nonionizing radiation: Secondary | ICD-10-CM

## 2021-01-10 NOTE — Progress Notes (Signed)
   Follow-Up Visit   Subjective  Emma Barker is a 55 y.o. female who presents for the following: Lesion (On the R breast - patient is here today for recheck scar vs dermatofibroma ) and lesions (On the nose - patient is concerned and would like them checked).  The following portions of the chart were reviewed this encounter and updated as appropriate:   Tobacco  Allergies  Meds  Problems  Med Hx  Surg Hx  Fam Hx     Review of Systems:  No other skin or systemic complaints except as noted in HPI or Assessment and Plan.  Objective  Well appearing patient in no apparent distress; mood and affect are within normal limits.  A focused examination was performed including the face and chest . Relevant physical exam findings are noted in the Assessment and Plan.  Objective  Right Breast: Clear.   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  Neoplasm of uncertain behavior of skin Right Breast Clear. Observe for recurrence. Call clinic for new or changing lesions.  Recommend regular skin exams, daily broad-spectrum spf 30+ sunscreen use, and photoprotection.    Nevus L preauricular/mandible Traumatized - plan shave removal at follow up appointment   Seborrheic Keratoses - Stuck-on, waxy, tan-brown papules and/or plaques  - Benign-appearing - Discussed benign etiology and prognosis. - Observe - Call for any changes  Actinic Damage - chronic, secondary to cumulative UV radiation exposure/sun exposure over time - diffuse scaly erythematous macules with underlying dyspigmentation - Recommend daily broad spectrum sunscreen SPF 30+ to sun-exposed areas, reapply every 2 hours as needed.  - Recommend staying in the shade or wearing long sleeves, sun glasses (UVA+UVB protection) and wide brim hats (4-inch brim around the entire circumference of the hat). - Call for new or changing  lesions.  Hemangiomas - Red papules - Discussed benign nature - Observe - Call for any changes  Return in about 3 months (around 04/12/2021) for will removed .  Luther Redo, CMA, am acting as scribe for Sarina Ser, MD .  Documentation: I have reviewed the above documentation for accuracy and completeness, and I agree with the above.  Sarina Ser, MD

## 2021-01-10 NOTE — Patient Instructions (Signed)

## 2021-01-19 ENCOUNTER — Encounter: Payer: Self-pay | Admitting: Dermatology

## 2021-04-19 ENCOUNTER — Ambulatory Visit: Admitting: Dermatology

## 2021-04-19 ENCOUNTER — Encounter: Payer: Self-pay | Admitting: Dermatology

## 2021-04-19 ENCOUNTER — Other Ambulatory Visit: Payer: Self-pay

## 2021-04-19 DIAGNOSIS — D489 Neoplasm of uncertain behavior, unspecified: Secondary | ICD-10-CM

## 2021-04-19 DIAGNOSIS — D2239 Melanocytic nevi of other parts of face: Secondary | ICD-10-CM

## 2021-04-19 DIAGNOSIS — D485 Neoplasm of uncertain behavior of skin: Secondary | ICD-10-CM

## 2021-04-19 DIAGNOSIS — L720 Epidermal cyst: Secondary | ICD-10-CM

## 2021-04-19 NOTE — Patient Instructions (Signed)
Biopsy Wound Care Instructions  Leave the original bandage on for 24 hours if possible.  If the bandage becomes soaked or soiled before that time, it is OK to remove it and examine the wound.  A small amount of post-operative bleeding is normal.  If excessive bleeding occurs, remove the bandage, place gauze over the site and apply continuous pressure (no peeking) over the area for 30 minutes. If this does not work, please call our clinic as soon as possible or page your doctor if it is after hours.   Once a day, cleanse the wound with soap and water. It is fine to shower. If a thick crust develops you may use a Q-tip dipped into dilute hydrogen peroxide (mix 1:1 with water) to dissolve it.  Hydrogen peroxide can slow the healing process, so use it only as needed.    After washing, apply petroleum jelly (Vaseline) or an antibiotic ointment if your doctor prescribed one for you, followed by a bandage.    For best healing, the wound should be covered with a layer of ointment at all times. If you are not able to keep the area covered with a bandage to hold the ointment in place, this may mean re-applying the ointment several times a day.  Continue this wound care until the wound has healed and is no longer open.   Itching and mild discomfort is normal during the healing process. However, if you develop pain or severe itching, please call our office.   If you have any discomfort, you can take Tylenol (acetaminophen) or ibuprofen as directed on the bottle. (Please do not take these if you have an allergy to them or cannot take them for another reason).  Some redness, tenderness and white or yellow material in the wound is normal healing.  If the area becomes very sore and red, or develops a thick yellow-green material (pus), it may be infected; please notify us.    If you have stitches, return to clinic as directed to have the stitches removed. You will continue wound care for 2-3 days after the stitches  are removed.   Wound healing continues for up to one year following surgery. It is not unusual to experience pain in the scar from time to time during the interval.  If the pain becomes severe or the scar thickens, you should notify the office.    A slight amount of redness in a scar is expected for the first six months.  After six months, the redness will fade and the scar will soften and fade.  The color difference becomes less noticeable with time.  If there are any problems, return for a post-op surgery check at your earliest convenience.  To improve the appearance of the scar, you can use silicone scar gel, cream, or sheets (such as Mederma or Serica) every night for up to one year. These are available over the counter (without a prescription).  Please call our office at [phone] for any questions or concerns.   If you have any questions or concerns for your doctor, please call our main line at [phone] and press option 4 to reach your doctor's medical assistant. If no one answers, please leave a voicemail as directed and we will return your call as soon as possible. Messages left after 4 pm will be answered the following business day.   You may also send [place] a message via [place]. We typically respond to MyChart messages within 1-2 business days.  For prescription  refills, please ask your pharmacy to contact our office. Our fax number is [phone].  If you have an urgent issue when the clinic is closed that cannot wait until the next business day, you can page your doctor at the number below.    Please note that while we do our best to be available for urgent issues outside of office hours, we are not available 24/7.   If you have an urgent issue and are unable to reach [place], you may choose to seek medical care at your doctor's office, retail clinic, urgent care center, or emergency room.  If you have a medical emergency, please immediately call 911 or go to the emergency  department.  Pager Numbers  - Dr. Nehemiah Massed: [phone]  - Dr. Laurence Ferrari: [phone]  - Dr. Nicole Kindred: [phone]  In the event of inclement weather, please call our main line at [phone] for an update on the status of any delays or closures.  Dermatology Medication Tips: Please keep the boxes that topical medications come in in order to help keep track of the instructions about where and how to use these. Pharmacies typically print the medication instructions only on the boxes and not directly on the medication tubes.   If your medication is too expensive, please contact our office at [phone] option 4 or send [place] a message through [place].   We are unable to tell what your co-pay for medications will be in advance as this is different depending on your insurance coverage. However, we may be able to find a substitute medication at lower cost or fill out paperwork to get insurance to cover a needed medication.   If a prior authorization is required to get your medication covered by your insurance company, please allow [place] 1-2 business days to complete this process.  Drug prices often vary depending on where the prescription is filled and some pharmacies may offer cheaper prices.  The website www.goodrx.com contains coupons for medications through different pharmacies. The prices here do not account for what the cost may be with help from insurance (it may be cheaper with your insurance), but the website can give you the price if you did not use any insurance.  - You can print the associated coupon and take it with your prescription to the pharmacy.  - You may also stop by our office during regular business hours and pick up a GoodRx coupon card.  - If you need your prescription sent electronically to a different pharmacy, notify our office through [facility] or by phone at [phone] option 4.

## 2021-04-19 NOTE — Progress Notes (Signed)
   Follow-Up Visit   Subjective  Emma Barker is a 55 y.o. female who presents for the following: Follow-up (Patient would like to have a spot at left side face removed that is growing and changing and symptomatic.  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  She also has a spot on her left cheek that has been growing and she picks and scratches at it.  She feels its a cyst and would like it removed.  The following portions of the chart were reviewed this encounter and updated as appropriate:  Tobacco  Allergies  Meds  Problems  Med Hx  Surg Hx  Fam Hx     Objective  Well appearing patient in no apparent distress; mood and affect are within normal limits.  A focused examination was performed including left cheek, left face, abdomen. Relevant physical exam findings are noted in the Assessment and Plan.  L preauricular/mandible 0.7 cm pink papule   left epigastric area 0.6 cm pink papule   left infraorbital cheek 0.6 cm Subcutaneous nodule.   Assessment & Plan  Neoplasm of uncertain behavior (2) L preauricular/mandible Epidermal / dermal shaving  Lesion diameter (cm):  0.7 Informed consent: discussed and consent obtained   Timeout: patient name, date of birth, surgical site, and procedure verified   Procedure prep:  Patient was prepped and draped in usual sterile fashion Prep type:  Isopropyl alcohol Anesthesia: the lesion was anesthetized in a standard fashion   Anesthetic:  1% lidocaine w/ epinephrine 1-100,000 buffered w/ 8.4% NaHCO3 Instrument used: flexible razor blade   Hemostasis achieved with: pressure, aluminum chloride and electrodesiccation   Outcome: patient tolerated procedure well   Post-procedure details: sterile dressing applied and wound care instructions given   Dressing type: bandage and petrolatum    Specimen 2 - Surgical  pathology Differential Diagnosis: Irritated nevus vs dysplasia  Check Margins: No  left epigastric area Epidermal / dermal shaving  Lesion diameter (cm):  0.6 Informed consent: discussed and consent obtained   Timeout: patient name, date of birth, surgical site, and procedure verified   Patient was prepped and draped in usual sterile fashion: area prepped with alcohol. Anesthesia: the lesion was anesthetized in a standard fashion   Anesthetic:  1% lidocaine w/ epinephrine 1-100,000 local infiltration Instrument used: flexible razor blade   Hemostasis achieved with: pressure, aluminum chloride and electrodesiccation   Outcome: patient tolerated procedure well   Post-procedure details: wound care instructions given   Post-procedure details comment:  Ointment and a small bandage applied  Specimen 1 - Surgical pathology Differential Diagnosis: Dermatofibroma vs ca   Check Margins: No Irritated nevus vs dysplasia  Dermatofibroma vs ca   Epidermoid cyst of skin left infraorbital cheek Cyst with symptoms and/or recent change.  Discussed surgical excision to remove, including resulting scar and possible recurrence.  Patient will schedule for surgery. Pre-op information given. Patient will schedule cyst removal   Return for schedule cyst removal .  I, Ruthell Rummage, CMA, am acting as scribe for Sarina Ser, MD. Documentation: I have reviewed the above documentation for accuracy and completeness, and I agree with the above.  Sarina Ser, MD

## 2021-04-25 ENCOUNTER — Telehealth: Payer: Self-pay

## 2021-04-25 NOTE — Telephone Encounter (Signed)
Advised pt of bx results/sh ?

## 2021-04-25 NOTE — Telephone Encounter (Signed)
-----   Message from Ralene Bathe, MD sent at 04/24/2021  5:44 PM EDT ----- Diagnosis 1. Skin , left epigastric area NEUROFIBROMA, BASE INVOLVED 2. Skin , L preauricular/mandible MELANOCYTIC NEVUS, INTRADERMAL TYPE, BASE INVOLVED  1&2 - both benign types of "mole" No further treatment needed unless recurs

## 2021-06-05 ENCOUNTER — Encounter: Admitting: Dermatology

## 2021-06-24 ENCOUNTER — Ambulatory Visit
Admission: EM | Admit: 2021-06-24 | Discharge: 2021-06-24 | Disposition: A | Discharge status: Home / Self Care | Attending: Physician Assistant | Admitting: Physician Assistant

## 2021-06-24 ENCOUNTER — Other Ambulatory Visit: Payer: Self-pay

## 2021-06-24 ENCOUNTER — Encounter: Payer: Self-pay | Admitting: Emergency Medicine

## 2021-06-24 DIAGNOSIS — S61211A Laceration without foreign body of left index finger without damage to nail, initial encounter: Secondary | ICD-10-CM | POA: Diagnosis not present

## 2021-06-24 MED ORDER — TETANUS-DIPHTH-ACELL PERTUSSIS 5-2.5-18.5 LF-MCG/0.5 IM SUSY: 0.5000 mL | PREFILLED_SYRINGE | Freq: Once | INTRAMUSCULAR | Status: DC

## 2021-06-24 NOTE — ED Triage Notes (Signed)
Patient states that she was trying to cut potatoes and cut her left 2nd finger on the metal cutter yesterday around 4pm.  Patient has dressing applied at this time.

## 2021-06-24 NOTE — ED Provider Notes (Signed)
[facility]    CSN: [id number] Arrival date & time: 06/24/21  1304      History   Chief Complaint Chief Complaint  Patient presents with   Extremity Laceration    Left 2nd finger    HPI Emma Barker is a 55 y.o. female.   HPI  Laceration: Patient presents with a laceration to her left second finger.  This occurred yesterday around 4 PM which is about 23 hours ago.  She states that she was using a potato cutter and cut her finger.  She is having a small amount of numbing of the finger but she thinks that this is from having her bandage on too tight.  She denies any range of motion difficulties or pain of the actual bone of her finger.  No risk of foreign body.  No fevers, discharge.  Bleeding has currently stopped.  She has not applied anything on it other than bandage. She reports that had a Tdap booster 1 year ago.   Past Medical History:  Diagnosis Date   Anxiety    agoraphobia   Arthritis    osteo - everywhere   Chest pain    related to thyroid issues   GERD (gastroesophageal reflux disease)    Graves disease    Graves disease    Graves' disease    Graves' disease    Headache    sinus   Hereditary pancreatitis    Hypertension    Hypothyroid    Lichen sclerosus of female genitalia    Mitral valve regurgitation    PONV (postoperative nausea and vomiting)    Sleep apnea    supposed to use CPAP - no machine   Squamous cell carcinoma of skin 09/21/2019   left forehead above mid brow [facility])   Stress incontinence    Tricuspid valve regurgitation    Von Willebrand's disease    Wears contact lenses     Patient Active Problem List   Diagnosis Date Noted   Mixed stress and urge urinary incontinence 77/82/4235   Lichen sclerosus 36/14/4315   Anxiety and depression 10/11/2015   Gastro-esophageal reflux disease without esophagitis 07/12/2015   Acquired hypothyroidism 06/14/2015   Essential (primary) hypertension 06/14/2015   Female climacteric  state 06/14/2015   Pure hypercholesterolemia 06/14/2015   Hypokalemia    Acute ethmoidal sinusitis    Facial cellulitis    SIRS (systemic inflammatory response syndrome) ([place]) 12/11/2014   Dental infection 12/11/2014   Leukocytosis 12/11/2014    Past Surgical History:  Procedure Laterality Date   ABDOMINAL HYSTERECTOMY     APPENDECTOMY     CATARACT EXTRACTION W/PHACO Right 03/09/2018   Procedure: CATARACT EXTRACTION PHACO AND INTRAOCULAR LENS PLACEMENT ([place]) RIGHT IVA TOPICAL SYMFONY LENS;  Surgeon: Eulogio Bear, MD;  Location: [place];  Service: Ophthalmology;  Laterality: Right;   CATARACT EXTRACTION W/PHACO Left 03/30/2018   Procedure: CATARACT EXTRACTION PHACO AND INTRAOCULAR LENS PLACEMENT ([place]) SYMFONY LENS LEFT;  Surgeon: Eulogio Bear, MD;  Location: [place];  Service: Ophthalmology;  Laterality: Left;   CHOLECYSTECTOMY     IMAGE GUIDED SINUS SURGERY Right 05/16/2015   Procedure: IMAGE GUIDED SINUS SURGERY, RIGHT ENDOSCOPIC MAXILLARY ANTROSTOMY, RIGHT ENDOSCOPIC ANTERIOR ETHMOIDECTOMY AND RIGHT ENDOSCOPIC FRONTAL RECESS EXPLORATION;  Surgeon: Clyde Canterbury, MD;  Location: [place];  Service: ENT;  Laterality: Right;  GAVE DISK TO CE CE   POLYPECTOMY      OB History     Gravida  3  Para  3   Term  3   Preterm      AB      Living  3      SAB      IAB      Ectopic      Multiple      Live Births               Home Medications    Prior to Admission medications   Medication Sig Start Date End Date Taking? Authorizing Provider  amoxicillin-clavulanate (AUGMENTIN) 875-125 MG tablet Take 1 tablet by mouth 2 (two) times daily. 06/19/21  Yes [provider]  aspirin EC 81 MG tablet Take by mouth.   Yes [provider]  cholecalciferol (VITAMIN D) 1000 UNITS tablet Take 5,000 Units by mouth daily.    Yes [provider]  fluticasone (FLONASE) 50 MCG/ACT nasal spray Place 1-2 sprays into  both nostrils daily.   Yes [provider]  hydrochlorothiazide (HYDRODIURIL) 25 MG tablet Take 25 mg by mouth daily. 12/13/19  Yes [provider]  losartan (COZAAR) 50 MG tablet Take 50 mg by mouth daily. 12/13/19  Yes [provider]  Magnesium 250 MG TABS Take by mouth daily.   Yes [provider]  PARoxetine (PAXIL) 40 MG tablet Take 20 mg by mouth daily.    Yes [provider]  simvastatin (ZOCOR) 20 MG tablet Take 20 mg by mouth at bedtime. 04/10/21  Yes [provider]  clobetasol cream (TEMOVATE) 3.41 % Apply 1 application topically 2 (two) times daily. 07/31/18   Gae Dry, MD  metroNIDAZOLE (METROGEL) 0.75 % gel Apply 1 application topically 2 (two) times daily as needed.    [provider]  nitroGLYCERIN (NITROSTAT) 0.4 MG SL tablet nitroglycerin 0.4 mg sublingual tablet 10/11/15   [provider]  rosuvastatin (CRESTOR) 20 MG tablet Take 20 mg by mouth at bedtime. 12/09/19   [provider]    Family History Family History  Problem Relation Age of Onset   Stroke Father    Pancreatic cancer Mother    Stroke Mother    Hematuria Daughter     Social History Social History   Tobacco Use   Smoking status: Every Day    Packs/day: 0.75    Years: 25.00    Pack years: 18.75    Types: Cigarettes    Last attempt to quit: 12/09/2014    Years since quitting: 6.5   Smokeless tobacco: Never  Vaping Use   Vaping Use: Never used  Substance Use Topics   Alcohol use: No   Drug use: No     Allergies   Contrast media [iodinated diagnostic agents]   Review of Systems Review of Systems  As stated above in HPI Physical Exam Triage Vital Signs ED Triage Vitals  Enc Vitals Group     BP 06/24/21 1330 (!) 166/102     Pulse Rate 06/24/21 1330 67     Resp 06/24/21 1330 14     Temp 06/24/21 1330 98.4 F (36.9 C)     Temp Source 06/24/21 1330 Oral     SpO2 06/24/21 1330 98 %     Weight 06/24/21  1326 213 lb (96.6 kg)     Height 06/24/21 1326 5\' 10"  (1.778 m)     Head Circumference --      Peak Flow --      Pain Score 06/24/21 1326 0     Pain Loc --  Pain Edu? --      Excl. in [place]? --    No data found.  Updated Vital Signs BP (!) 166/102 (BP Location: Left Arm)   Pulse 67   Temp 98.4 F (36.9 C) (Oral)   Resp 14   Ht 5\' 10"  (1.778 m)   Wt 213 lb (96.6 kg)   SpO2 98%   BMI 30.56 kg/m   Physical Exam Vitals and nursing note reviewed.  Constitutional:      General: She is not in acute distress.    Appearance: Normal appearance. She is not ill-appearing, toxic-appearing or diaphoretic.  Cardiovascular:     Pulses: Normal pulses.  Musculoskeletal:        General: Signs of injury (10 mm superficial laceration of the left 2nd distolateral finger) present. No swelling. Normal range of motion.  Skin:    General: Skin is warm.     Capillary Refill: Capillary refill takes less than 2 seconds.  Neurological:     General: No focal deficit present.     Mental Status: She is alert.     UC Treatments / Results  Labs (all labs ordered are listed, but only abnormal results are displayed) Labs Reviewed - No data to display  EKG   Radiology No results found.  Procedures Procedures (including critical care time)  Medications Ordered in UC Medications - No data to display  Initial Impression / Assessment and Plan / UC Course  I have reviewed the triage vital signs and the nursing notes.  Pertinent labs & imaging results that were available during my care of the patient were reviewed by me and considered in my medical decision making (see chart for details).     New. It has been too long for sutures. Treating with antiseptic wash and steri strips. She notes that she currently is 2 days into a 10 day course of augmentin for a sinus infection so no additional ABX needed. Discussed red flag signs and symptoms. Follow up PRN.  Final Clinical Impressions(s) / UC  Diagnoses   Final diagnoses:  None   Discharge Instructions   None    ED Prescriptions   None    PDMP not reviewed this encounter.   Hughie Closs, [place] 06/24/21 1451

## 2021-09-28 ENCOUNTER — Telehealth: Admitting: Family Medicine

## 2021-09-28 DIAGNOSIS — R0981 Nasal congestion: Secondary | ICD-10-CM

## 2021-09-28 DIAGNOSIS — R12 Heartburn: Secondary | ICD-10-CM | POA: Diagnosis not present

## 2021-09-28 NOTE — Progress Notes (Signed)
Virtual Visit Consent   Emma Barker, you are scheduled for a virtual visit with a [facility] provider today.     Just as with appointments in the office, your consent must be obtained to participate.  Your consent will be active for this visit and any virtual visit you may have with one of our providers in the next 365 days.     If you have a MyChart account, a copy of this consent can be sent to you electronically.  All virtual visits are billed to your insurance company just like a traditional visit in the office.    As this is a virtual visit, video technology does not allow for your provider to perform a traditional examination.  This may limit your provider's ability to fully assess your condition.  If your provider identifies any concerns that need to be evaluated in person or the need to arrange testing (such as labs, EKG, etc.), we will make arrangements to do so.     Although advances in technology are sophisticated, we cannot ensure that it will always work on either your end or our end.  If the connection with a video visit is poor, the visit may have to be switched to a telephone visit.  With either a video or telephone visit, we are not always able to ensure that we have a secure connection.     I need to obtain your verbal consent now.   Are you willing to proceed with your visit today?    Emma Barker has provided verbal consent on 09/28/2021 for a virtual visit (video or telephone).   Perlie Mayo, NP   Date: 09/28/2021 1:32 PM   Virtual Visit via Video Note   I, Perlie Mayo, connected with  Emma Barker  ([id number], Jan 16, 1966) on 09/28/21 at  2:00 PM EST by a video-enabled telemedicine application and verified that I am speaking with the correct person using two identifiers.  Location: Patient: Virtual Visit Location Patient: Home Provider: Virtual Visit Location Provider: Home Office   I discussed the limitations of evaluation and management by  telemedicine and the availability of in person appointments. The patient expressed understanding and agreed to proceed.    History of Present Illness: Emma Barker is a 56 y.o. who identifies as a female who was assigned female at birth, and is being seen today for Tuesday- vomiting/diarrhea- improved. Reports she thinks some got into nose area -throat was burning next day. Right side face and ear pain. Reports a fluid feeling. Nasal drainage and congestion. Rib cage sore from vomiting- improved. Worse at night- reports heartburn at times is on omeprazole for this. Has taken mucinex and flonase. Tested for COVID twice both are negative. Denies fevers, chills, no chest congestion, chest pain, and shortness of breath. Reports family members had GI symptoms as well.   Problems:  Patient Active Problem List   Diagnosis Date Noted   Mixed stress and urge urinary incontinence 02/19/9484   Lichen sclerosus 46/27/0350   Anxiety and depression 10/11/2015   Gastro-esophageal reflux disease without esophagitis 07/12/2015   Acquired hypothyroidism 06/14/2015   Essential (primary) hypertension 06/14/2015   Female climacteric state 06/14/2015   Pure hypercholesterolemia 06/14/2015   Hypokalemia    Acute ethmoidal sinusitis    Facial cellulitis    SIRS (systemic inflammatory response syndrome) ([place]) 12/11/2014   Dental infection 12/11/2014   Leukocytosis 12/11/2014    Allergies:  Allergies  Allergen Reactions   Contrast  Media [Iodinated Contrast Media] Hives and Nausea Only   Medications:  Current Outpatient Medications:    amoxicillin-clavulanate (AUGMENTIN) 875-125 MG tablet, Take 1 tablet by mouth 2 (two) times daily., Disp: , Rfl:    aspirin EC 81 MG tablet, Take by mouth., Disp: , Rfl:    cholecalciferol (VITAMIN D) 1000 UNITS tablet, Take 5,000 Units by mouth daily. , Disp: , Rfl:    clobetasol cream (TEMOVATE) 3.53 %, Apply 1 application topically 2 (two) times daily., Disp: 30 g, Rfl:  3   fluticasone (FLONASE) 50 MCG/ACT nasal spray, Place 1-2 sprays into both nostrils daily., Disp: , Rfl:    hydrochlorothiazide (HYDRODIURIL) 25 MG tablet, Take 25 mg by mouth daily., Disp: , Rfl:    losartan (COZAAR) 50 MG tablet, Take 50 mg by mouth daily., Disp: , Rfl:    Magnesium 250 MG TABS, Take by mouth daily., Disp: , Rfl:    metroNIDAZOLE (METROGEL) 0.75 % gel, Apply 1 application topically 2 (two) times daily as needed., Disp: , Rfl:    nitroGLYCERIN (NITROSTAT) 0.4 MG SL tablet, nitroglycerin 0.4 mg sublingual tablet, Disp: , Rfl:    PARoxetine (PAXIL) 40 MG tablet, Take 20 mg by mouth daily. , Disp: , Rfl:    rosuvastatin (CRESTOR) 20 MG tablet, Take 20 mg by mouth at bedtime., Disp: , Rfl:    simvastatin (ZOCOR) 20 MG tablet, Take 20 mg by mouth at bedtime., Disp: , Rfl:   Observations/Objective: Patient is well-developed, well-nourished in no acute distress.  Resting comfortably  at home.  Head is normocephalic, atraumatic.  No labored breathing.  Speech is clear and coherent with logical content.  Patient is alert and oriented at baseline.    Assessment and Plan:  1. Heartburn   2. Nasal congestion   In the setting of GI upset with vomiting- I think reflux and acid from stomach has produced sinus irritation and inflammation. Continue treatment with OTC flonase and omeprazole, tylenol and NSAIDs as needed and as directed. No other red flags for viral issue post GI issue.   No need for antibiotics at this time.  Patient acknowledged agreement and understanding of the plan.     Follow Up Instructions: I discussed the assessment and treatment plan with the patient. The patient was provided an opportunity to ask questions and all were answered. The patient agreed with the plan and demonstrated an understanding of the instructions.  A copy of instructions were sent to the patient via MyChart unless otherwise noted below.    The patient was advised to call back or  seek an in-person evaluation if the symptoms worsen or if the condition fails to improve as anticipated.  Time:  I spent 10 minutes with the patient via telehealth technology discussing the above problems/concerns.    Perlie Mayo, NP

## 2021-09-28 NOTE — Patient Instructions (Signed)
Acid refluxed into nose and throat from vomiting are most likely cause of symptoms. Continue all medications as directed.  F/u with PCP if not improved after the upcoming Tuesday.

## 2021-12-06 ENCOUNTER — Ambulatory Visit (INDEPENDENT_AMBULATORY_CARE_PROVIDER_SITE_OTHER): Admitting: Dermatology

## 2021-12-06 DIAGNOSIS — Z85828 Personal history of other malignant neoplasm of skin: Secondary | ICD-10-CM | POA: Diagnosis not present

## 2021-12-06 DIAGNOSIS — D229 Melanocytic nevi, unspecified: Secondary | ICD-10-CM

## 2021-12-06 DIAGNOSIS — L820 Inflamed seborrheic keratosis: Secondary | ICD-10-CM

## 2021-12-06 DIAGNOSIS — L821 Other seborrheic keratosis: Secondary | ICD-10-CM | POA: Diagnosis not present

## 2021-12-06 DIAGNOSIS — L720 Epidermal cyst: Secondary | ICD-10-CM | POA: Diagnosis not present

## 2021-12-06 NOTE — Patient Instructions (Addendum)
? ?Pre-Operative Instructions ? ?You are scheduled for a surgical procedure at [facility]. We recommend you read the following instructions. If you have any questions or concerns, please call the office at [phone]. ? ?Shower and wash the entire body with soap and water the day of your surgery paying special attention to cleansing at and around the planned surgery site. ? ?Avoid aspirin or aspirin containing products at least fourteen (14) days prior to your surgical procedure and for at least one week (7 Days) after your surgical procedure. If you take aspirin on a regular basis for heart disease or history of stroke or for any other reason, we may recommend you continue taking aspirin but please notify us if you take this on a regular basis. Aspirin can cause more bleeding to occur during surgery as well as prolonged bleeding and bruising after surgery.  ? ?Avoid other nonsteroidal pain medications at least one week prior to surgery and at least one week prior to your surgery. These include medications such as Ibuprofen (Motrin, Advil and Nuprin), Naprosyn, Voltaren, Relafen, etc. If medications are used for therapeutic reasons, please inform us as they can cause increased bleeding or prolonged bleeding during and bruising after surgical procedures.  ? ?Please advise [place] if you are taking any "blood thinner" medications such as Coumadin or Dipyridamole or Plavix or similar medications. These cause increased bleeding and prolonged bleeding during procedures and bruising after surgical procedures. We may have to consider discontinuing these medications briefly prior to and shortly after your surgery if safe to do so.  ? ?Please inform us of all medications you are currently taking. All medications that are taken regularly should be taken the day of surgery as you always do. Nevertheless, we need to be informed of what medications you are taking prior to surgery to know whether they will affect the  procedure or cause any complications.  ? ?Please inform us of any medication allergies. Also inform us of whether you have allergies to Latex or rubber products or whether you have had any adverse reaction to Lidocaine or Epinephrine. ? ?Please inform us of any prosthetic or artificial body parts such as artificial heart valve, joint replacements, etc., or similar condition that might require preoperative antibiotics.  ? ?We recommend avoidance of alcohol at least two weeks prior to surgery and continued avoidance for at least two weeks after surgery.  ? ?We recommend discontinuation of tobacco smoking at least two weeks prior to surgery and continued abstinence for at least two weeks after surgery. ? ?Do not plan strenuous exercise, strenuous work or strenuous lifting for approximately four weeks after your surgery.  ? ?We request if you are unable to make your scheduled surgical appointment, please call us at least a week in advance or as soon as you are aware of a problem so that we can cancel or reschedule the appointment.  ? ?You MAY TAKE TYLENOL (acetaminophen) for pain as it is not a blood thinner.  ? ?PLEASE PLAN TO BE IN TOWN FOR TWO WEEKS FOLLOWING SURGERY, THIS IS IMPORTANT SO YOU CAN BE CHECKED FOR DRESSING CHANGES, SUTURE REMOVAL AND TO MONITOR FOR POSSIBLE COMPLICATIONS.  ? ? ?Cryotherapy Aftercare ? ?Wash gently with soap and water everyday.   ?Apply Vaseline and Band-Aid daily until healed.  ? ? ?Seborrheic Keratosis ? ?What causes seborrheic keratoses? ?Seborrheic keratoses are harmless, common skin growths that first appear during adult life.  As time goes by, more growths appear.  Some people  may develop a large number of them.  Seborrheic keratoses appear on both covered and uncovered body parts.  They are not caused by sunlight.  The tendency to develop seborrheic keratoses can be inherited.  They vary in color from skin-colored to gray, brown, or even black.  They can be either smooth or have a  rough, warty surface.   ?Seborrheic keratoses are superficial and look as if they were stuck on the skin.  Under the microscope this type of keratosis looks like layers upon layers of skin.  That is why at times the top layer may seem to fall off, but the rest of the growth remains and re-grows.   ? ?Treatment ?Seborrheic keratoses do not need to be treated, but can easily be removed in the office.  Seborrheic keratoses often cause symptoms when they rub on clothing or jewelry.  Lesions can be in the way of shaving.  If they become inflamed, they can cause itching, soreness, or burning.  Removal of a seborrheic keratosis can be accomplished by freezing, burning, or surgery. ?If any spot bleeds, scabs, or grows rapidly, please return to have it checked, as these can be an indication of a skin cancer. ? ?If You Need Anything After Your Visit ? ?If you have any questions or concerns for your doctor, please call our main line at [phone] and press option 4 to reach your doctor's medical assistant. If no one answers, please leave a voicemail as directed and we will return your call as soon as possible. Messages left after 4 pm will be answered the following business day.  ? ?You may also send [place] a message via MyChart. We typically respond to MyChart messages within 1-2 business days. ? ?For prescription refills, please ask your pharmacy to contact our office. Our fax number is [phone]. ? ?If you have an urgent issue when the clinic is closed that cannot wait until the next business day, you can page your doctor at the number below.   ? ?Please note that while we do our best to be available for urgent issues outside of office hours, we are not available 24/7.  ? ?If you have an urgent issue and are unable to reach [place], you may choose to seek medical care at your doctor's office, retail clinic, urgent care center, or emergency room. ? ?If you have a medical emergency, please immediately call 911 or go to the  emergency department. ? ?Pager Numbers ? ?- Dr. Nehemiah Massed: [phone] ? ?- Dr. Laurence Ferrari: [phone] ? ?- Dr. Nicole Kindred: [phone] ? ?In the event of inclement weather, please call our main line at [phone] for an update on the status of any delays or closures. ? ?Dermatology Medication Tips: ?Please keep the boxes that topical medications come in in order to help keep track of the instructions about where and how to use these. Pharmacies typically print the medication instructions only on the boxes and not directly on the medication tubes.  ? ?If your medication is too expensive, please contact our office at [phone] option 4 or send [place] a message through [place].  ? ?We are unable to tell what your co-pay for medications will be in advance as this is different depending on your insurance coverage. However, we may be able to find a substitute medication at lower cost or fill out paperwork to get insurance to cover a needed medication.  ? ?If a prior authorization is required to get your medication covered by your insurance company, please allow  [place] 1-2 business days to complete this process. ? ?Drug prices often vary depending on where the prescription is filled and some pharmacies may offer cheaper prices. ? ?The website www.goodrx.com contains coupons for medications through different pharmacies. The prices here do not account for what the cost may be with help from insurance (it may be cheaper with your insurance), but the website can give you the price if you did not use any insurance.  ?- You can print the associated coupon and take it with your prescription to the pharmacy.  ?- You may also stop by our office during regular business hours and pick up a GoodRx coupon card.  ?- If you need your prescription sent electronically to a different pharmacy, notify our office through [facility] or by phone at [phone] option 4. ? ? ? ? ?Si Usted Necesita Algo Despu?s de Su Visita ? ?Tambi?n puede  enviarnos un mensaje a trav?s de MyChart. Por lo general respondemos a los mensajes de MyChart en el transcurso de 1 a 2 d?as h?biles. ? ?Para renovar recetas, por favor pida a su farmacia que se ponga en contact

## 2021-12-06 NOTE — Progress Notes (Signed)
? ?  Follow-Up Visit ?  ?Subjective  ?Emma Barker is a 56 y.o. female who presents for the following: check spots (Back, looks different). ?The patient has spots, moles and lesions to be evaluated, some may be new or changing and the patient has concerns that these could be cancer. ? ?The following portions of the chart were reviewed this encounter and updated as appropriate:  ? Tobacco  Allergies  Meds  Problems  Med Hx  Surg Hx  Fam Hx   ?  ?Review of Systems:  No other skin or systemic complaints except as noted in HPI or Assessment and Plan. ? ?Objective  ?Well appearing patient in no apparent distress; mood and affect are within normal limits. ? ?A focused examination was performed including back. Relevant physical exam findings are noted in the Assessment and Plan. ? ?R upper back x 1 ?Stuck on waxy paps with erythema ? ?L infraorbitial cheek ?Cystic pap ? ? ?Assessment & Plan  ? ?Melanocytic Nevi ?- Tan-brown and/or pink-flesh-colored symmetric macules and papules ?- Benign appearing on exam today ?- Observation ?- Call clinic for new or changing moles ?- Recommend daily use of broad spectrum spf 30+ sunscreen to sun-exposed areas.  ? ?History of Squamous Cell Carcinoma of the Skin ?- No evidence of recurrence today ?- No lymphadenopathy ?- Recommend regular full body skin exams ?- Recommend daily broad spectrum sunscreen SPF 30+ to sun-exposed areas, reapply every 2 hours as needed.  ?- Call if any new or changing lesions are noted between office visits ?- L forehead above mid brow ? ? Seborrheic Keratoses ?- Stuck-on, waxy, tan-brown papules and/or plaques  ?- Benign-appearing ?- Discussed benign etiology and prognosis. ?- Observe ?- Call for any changes ? ?Inflamed seborrheic keratosis ?R upper back x 1 ? ?Destruction of lesion - R upper back x 1 ?Complexity: simple   ?Destruction method: cryotherapy   ?Informed consent: discussed and consent obtained   ?Timeout:  patient name, date of birth,  surgical site, and procedure verified ?Lesion destroyed using liquid nitrogen: Yes   ?Region frozen until ice ball extended beyond lesion: Yes   ?Outcome: patient tolerated procedure well with no complications   ?Post-procedure details: wound care instructions given   ? ?Epidermal cyst ?L infraorbitial cheek ?Discussed excising, pt will schedule ?Benign-appearing. Exam most consistent with an epidermal inclusion cyst. Discussed that a cyst is a benign growth that can grow over time and sometimes get irritated or inflamed. Recommend observation if it is not bothersome. Discussed option of surgical excision to remove it if it is growing, symptomatic, or other changes noted. Please call for new or changing lesions so they can be evaluated. ? ?Return for schedule for cyst surgery, will plan TBSE at post op. ? ?I, Othelia Pulling, RMA, am acting as scribe for Sarina Ser, MD . ?Documentation: I have reviewed the above documentation for accuracy and completeness, and I agree with the above. ? ?Sarina Ser, MD ? ?

## 2021-12-11 ENCOUNTER — Encounter: Payer: Self-pay | Admitting: Dermatology

## 2021-12-17 ENCOUNTER — Other Ambulatory Visit: Payer: Self-pay | Admitting: Adult Health

## 2021-12-17 DIAGNOSIS — Z1231 Encounter for screening mammogram for malignant neoplasm of breast: Secondary | ICD-10-CM

## 2021-12-19 ENCOUNTER — Encounter: Payer: Self-pay | Admitting: Obstetrics and Gynecology

## 2021-12-19 ENCOUNTER — Ambulatory Visit (INDEPENDENT_AMBULATORY_CARE_PROVIDER_SITE_OTHER): Admitting: Obstetrics and Gynecology

## 2021-12-19 VITALS — BP 140/84 | Ht 70.0 in | Wt 215.0 lb

## 2021-12-19 DIAGNOSIS — N898 Other specified noninflammatory disorders of vagina: Secondary | ICD-10-CM

## 2021-12-19 DIAGNOSIS — R1032 Left lower quadrant pain: Secondary | ICD-10-CM

## 2021-12-19 DIAGNOSIS — Z1231 Encounter for screening mammogram for malignant neoplasm of breast: Secondary | ICD-10-CM | POA: Diagnosis not present

## 2021-12-19 DIAGNOSIS — Z01419 Encounter for gynecological examination (general) (routine) without abnormal findings: Secondary | ICD-10-CM | POA: Diagnosis not present

## 2021-12-19 DIAGNOSIS — L900 Lichen sclerosus et atrophicus: Secondary | ICD-10-CM

## 2021-12-19 DIAGNOSIS — N941 Unspecified dyspareunia: Secondary | ICD-10-CM

## 2021-12-19 DIAGNOSIS — Z800 Family history of malignant neoplasm of digestive organs: Secondary | ICD-10-CM

## 2021-12-19 LAB — POCT WET PREP WITH KOH
Clue Cells Wet Prep HPF POC: NEGATIVE
KOH Prep POC: NEGATIVE
Trichomonas, UA: NEGATIVE
Yeast Wet Prep HPF POC: NEGATIVE

## 2021-12-19 MED ORDER — CLOBETASOL PROPIONATE 0.05 % EX OINT: TOPICAL_OINTMENT | CUTANEOUS | 1 refills | Status: DC

## 2021-12-19 NOTE — Progress Notes (Signed)
? ?PCP: Remi Haggard, FNP ? ? ?Chief Complaint  ?Patient presents with  ? Gynecologic Exam  ?  Pain during intercourse  ? Vaginal Discharge  ?  Dark yellow, itchiness and irritation  ? ? ?HPI: ?     [name] is a 56 y.o. [phone] whose LMP was No LMP recorded. Patient has had a hysterectomy., presents today for her annual examination.  Her menses are absent due to TAH in [place] for urinary issues; also s/p bladder sling. No PMB, occas vasomotor sx.   ? ?Hx of LS, treats with clobetasol 0.05% crm every few days. Has noticed increased itching/skin tear above clitoris now. Also having vaginal itching and pain at vag opening. Noticed a yellow d/c once last wk. No prior abx use.  ? ?Sex activity: single partner, contraception - status post hysterectomy. She does not have vaginal dryness. Had LLQ pain with sex once about 2 wks ago. Also noted pain LT hip towards groin recently.  ? ?Last Pap: 12/17/19  Results were: no abnormalities /neg HPV DNA. Still has cx. ? ?Last mammogram: not recent; has appt 5/23 through PCP ?There is no FH of breast cancer. There is a FH of ovarian cancer in her mat aunt and pancreatic cancer in her mother, genetic testing not done. The patient does occas self-breast exams. ? ?Colonoscopy: 2020 was normal;  Repeat due after 10 years.  ? ?Tobacco use: smokes cigs 1 ppd ?Alcohol use: none ?Exercise: not active ? ?She does get adequate calcium and Vitamin D in her diet. ? ?Labs with PCP. ? ? ?Patient Active Problem List  ? Diagnosis Date Noted  ? Family history of pancreatic cancer 12/19/2021  ? Mixed stress and urge urinary incontinence 07/31/2018  ? Lichen sclerosus 22/09/5425  ? Anxiety and depression 10/11/2015  ? Gastro-esophageal reflux disease without esophagitis 07/12/2015  ? Acquired hypothyroidism 06/14/2015  ? Essential (primary) hypertension 06/14/2015  ? Female climacteric state 06/14/2015  ? Pure hypercholesterolemia 06/14/2015  ? Hypokalemia   ? Acute ethmoidal  sinusitis   ? Facial cellulitis   ? SIRS (systemic inflammatory response syndrome) ([place]) 12/11/2014  ? Dental infection 12/11/2014  ? Leukocytosis 12/11/2014  ? ? ?Past Surgical History:  ?Procedure Laterality Date  ? ABDOMINAL HYSTERECTOMY    ? APPENDECTOMY    ? CATARACT EXTRACTION W/PHACO Right 03/09/2018  ? Procedure: CATARACT EXTRACTION PHACO AND INTRAOCULAR LENS PLACEMENT ([place]) RIGHT IVA TOPICAL SYMFONY LENS;  Surgeon: Eulogio Bear, MD;  Location: [place];  Service: Ophthalmology;  Laterality: Right;  ? CATARACT EXTRACTION W/PHACO Left 03/30/2018  ? Procedure: CATARACT EXTRACTION PHACO AND INTRAOCULAR LENS PLACEMENT ([place]) SYMFONY LENS LEFT;  Surgeon: Eulogio Bear, MD;  Location: [place];  Service: Ophthalmology;  Laterality: Left;  ? CHOLECYSTECTOMY    ? IMAGE GUIDED SINUS SURGERY Right 05/16/2015  ? Procedure: IMAGE GUIDED SINUS SURGERY, RIGHT ENDOSCOPIC MAXILLARY ANTROSTOMY, RIGHT ENDOSCOPIC ANTERIOR ETHMOIDECTOMY AND RIGHT ENDOSCOPIC FRONTAL RECESS EXPLORATION;  Surgeon: Clyde Canterbury, MD;  Location: [place];  Service: ENT;  Laterality: Right;  GAVE DISK TO CE CE  ? POLYPECTOMY    ? ? ?Family History  ?Problem Relation Age of Onset  ? Pancreatic cancer Mother 46  ? Stroke Mother   ? Stroke Father   ? Ovarian cancer Maternal Aunt 60  ? Hematuria Daughter   ? ? ?Social History  ? ?Socioeconomic History  ? Marital status: Married  ?  Spouse name: Not on file  ? Number of children:  Not on file  ? Years of education: Not on file  ? Highest education level: Not on file  ?Occupational History  ? Not on file  ?Tobacco Use  ? Smoking status: Every Day  ?  Packs/day: 0.75  ?  Years: 25.00  ?  Pack years: 18.75  ?  Types: Cigarettes  ?  Last attempt to quit: 12/09/2014  ?  Years since quitting: 7.0  ? Smokeless tobacco: Never  ?Vaping Use  ? Vaping Use: Never used  ?Substance and Sexual Activity  ? Alcohol use: No  ? Drug use: No  ? Sexual activity: Yes  ?  Birth  control/protection: None  ?Other Topics Concern  ? Not on file  ?Social History Narrative  ? Not on file  ? ?Social Determinants of Health  ? ?Financial Resource Strain: Not on file  ?Food Insecurity: Not on file  ?Transportation Needs: Not on file  ?Physical Activity: Not on file  ?Stress: Not on file  ?Social Connections: Not on file  ?Intimate Partner Violence: Not on file  ? ? ? ?Current Outpatient Medications:  ?  aspirin EC 81 MG tablet, Take by mouth., Disp: , Rfl:  ?  azelastine (ASTELIN) 0.1 % nasal spray, Place into both nostrils., Disp: , Rfl:  ?  clobetasol ointment (TEMOVATE) 0.05 %, Apply to affected area every night for 4 weeks, then every other day for 4 weeks, Disp: 45 g, Rfl: 1 ?  esomeprazole (NEXIUM) 40 MG capsule, Take 40 mg by mouth daily., Disp: , Rfl:  ?  fluticasone (FLONASE) 50 MCG/ACT nasal spray, Place 1-2 sprays into both nostrils daily., Disp: , Rfl:  ?  losartan-hydrochlorothiazide (HYZAAR) 100-12.5 MG tablet, Take 1 tablet by mouth daily., Disp: , Rfl:  ?  Magnesium 250 MG TABS, Take by mouth daily., Disp: , Rfl:  ?  metroNIDAZOLE (METROGEL) 0.75 % gel, Apply 1 application topically 2 (two) times daily as needed., Disp: , Rfl:  ?  nitroGLYCERIN (NITROSTAT) 0.4 MG SL tablet, nitroglycerin 0.4 mg sublingual tablet, Disp: , Rfl:  ?  PARoxetine (PAXIL) 20 MG tablet, Take 20 mg by mouth daily., Disp: , Rfl:  ?  simvastatin (ZOCOR) 20 MG tablet, Take 20 mg by mouth at bedtime., Disp: , Rfl:  ? ? ? ? ?ROS: ? ?Review of Systems  ?Constitutional:  Negative for fatigue, fever and unexpected weight change.  ?Respiratory:  Negative for cough, shortness of breath and wheezing.   ?Cardiovascular:  Negative for chest pain, palpitations and leg swelling.  ?Gastrointestinal:  Negative for blood in stool, constipation, diarrhea, nausea and vomiting.  ?Endocrine: Negative for cold intolerance, heat intolerance and polyuria.  ?Genitourinary:  Positive for dyspareunia, vaginal discharge and vaginal pain.  Negative for dysuria, flank pain, frequency, genital sores, hematuria, menstrual problem, pelvic pain, urgency and vaginal bleeding.  ?Musculoskeletal:  Positive for arthralgias. Negative for back pain, joint swelling and myalgias.  ?Skin:  Negative for rash.  ?Neurological:  Negative for dizziness, syncope, light-headedness, numbness and headaches.  ?Hematological:  Negative for adenopathy.  ?Psychiatric/Behavioral:  Negative for agitation, confusion, sleep disturbance and suicidal ideas. The patient is not nervous/anxious.   ?BREAST: No symptoms ? ? ? ?Objective: ?BP 140/84   Ht '5\' 10"'$  (1.778 m)   Wt 215 lb (97.5 kg)   BMI 30.85 kg/m?  ? ? ?Physical Exam ?Constitutional:   ?   Appearance: She is well-developed.  ?Genitourinary:  ?   Vulva normal.  ?   Genitourinary Comments: UTERUS/CX SURG REM  ?  Right Labia: lesions.  ?   Right Labia: No rash or tenderness. ?   Left Labia: lesions.  ?   Left Labia: No tenderness or rash. ? ? ? ?   Vaginal cuff intact. ?   No vaginal discharge, erythema or tenderness.  ? ?   Right Adnexa: not tender and no mass present. ?   Left Adnexa: tender. ?   Left Adnexa: no mass present. ?   Cervix is absent.  ?   Uterus is absent.  ?Breasts: ?   Right: No mass, nipple discharge, skin change or tenderness.  ?   Left: No mass, nipple discharge, skin change or tenderness.  ?Neck:  ?   Thyroid: No thyromegaly.  ?Cardiovascular:  ?   Rate and Rhythm: Normal rate and regular rhythm.  ?   Heart sounds: Normal heart sounds. No murmur heard. ?Pulmonary:  ?   Effort: Pulmonary effort is normal.  ?   Breath sounds: Normal breath sounds.  ?Abdominal:  ?   Palpations: Abdomen is soft.  ?   Tenderness: There is abdominal tenderness in the left lower quadrant. There is no guarding or rebound.  ?Musculoskeletal:     ?   General: Normal range of motion.  ?   Cervical back: Normal range of motion.  ?Neurological:  ?   General: No focal deficit present.  ?   Mental Status: She is alert and oriented to  person, place, and time.  ?   Cranial Nerves: No cranial nerve deficit.  ?Skin: ?   General: Skin is warm and dry.  ?Psychiatric:     ?   Mood and Affect: Mood normal.     ?   Behavior: Behavior normal.     ?   Thought Cont

## 2021-12-19 NOTE — Patient Instructions (Signed)
I value your feedback and you entrusting us with your care. If you get a [place] patient survey, I would appreciate you taking the time to let us know about your experience today. Thank you! ? ? ?

## 2021-12-24 DIAGNOSIS — Z1371 Encounter for nonprocreative screening for genetic disease carrier status: Secondary | ICD-10-CM

## 2021-12-24 DIAGNOSIS — Z800 Family history of malignant neoplasm of digestive organs: Secondary | ICD-10-CM

## 2021-12-24 HISTORY — DX: Family history of malignant neoplasm of digestive organs: Z80.0

## 2021-12-24 HISTORY — DX: Encounter for nonprocreative screening for genetic disease carrier status: Z13.71

## 2022-01-03 ENCOUNTER — Ambulatory Visit (INDEPENDENT_AMBULATORY_CARE_PROVIDER_SITE_OTHER)

## 2022-01-03 ENCOUNTER — Other Ambulatory Visit: Payer: Self-pay | Admitting: Obstetrics and Gynecology

## 2022-01-03 ENCOUNTER — Telehealth: Payer: Self-pay | Admitting: Obstetrics and Gynecology

## 2022-01-03 DIAGNOSIS — R1032 Left lower quadrant pain: Secondary | ICD-10-CM

## 2022-01-03 NOTE — Telephone Encounter (Signed)
Pt aware of neg GYN u/s results for LLQ pain. Most likely MSK given sx and hx. Try pelvic/psoas stretching/exercises, can do chiro. NSAIDs/heating pad, too. F/u prn ?

## 2022-01-14 ENCOUNTER — Encounter: Payer: Self-pay | Admitting: Obstetrics and Gynecology

## 2022-01-16 ENCOUNTER — Ambulatory Visit
Admission: RE | Admit: 2022-01-16 | Discharge: 2022-01-16 | Disposition: A | Discharge status: Home / Self Care | Source: Ambulatory Visit | Attending: Adult Health | Admitting: Adult Health

## 2022-01-16 DIAGNOSIS — Z1231 Encounter for screening mammogram for malignant neoplasm of breast: Secondary | ICD-10-CM | POA: Insufficient documentation

## 2022-01-18 ENCOUNTER — Other Ambulatory Visit: Payer: Self-pay | Admitting: *Deleted

## 2022-01-18 ENCOUNTER — Inpatient Hospital Stay
Admission: RE | Admit: 2022-01-18 | Discharge: 2022-01-18 | Disposition: A | Discharge status: Home / Self Care | Payer: Self-pay | Source: Ambulatory Visit | Attending: *Deleted | Admitting: *Deleted

## 2022-01-18 DIAGNOSIS — Z1231 Encounter for screening mammogram for malignant neoplasm of breast: Secondary | ICD-10-CM

## 2022-01-22 ENCOUNTER — Telehealth: Payer: Self-pay | Admitting: Obstetrics and Gynecology

## 2022-01-22 NOTE — Telephone Encounter (Signed)
Pt aware of neg MyRisk results. IBIS=6.0%/riskscore=2.8%. No extra recommendations at this time.   Patient understands these results only apply to her and her children, and this is not indicative of genetic testing results of her other family members. It is recommended that her other family members have genetic testing done.  Pt also understands negative genetic testing doesn't mean she will never get any of these cancers.   Hard copy mailed to pt. F/u prn.

## 2022-02-05 ENCOUNTER — Encounter: Payer: Self-pay | Admitting: Dermatology

## 2022-02-05 ENCOUNTER — Ambulatory Visit (INDEPENDENT_AMBULATORY_CARE_PROVIDER_SITE_OTHER): Admitting: Dermatology

## 2022-02-05 ENCOUNTER — Telehealth: Payer: Self-pay

## 2022-02-05 DIAGNOSIS — L720 Epidermal cyst: Secondary | ICD-10-CM

## 2022-02-05 DIAGNOSIS — D492 Neoplasm of unspecified behavior of bone, soft tissue, and skin: Secondary | ICD-10-CM

## 2022-02-05 MED ORDER — MUPIROCIN 2 % EX OINT: 1.0000 "application " | TOPICAL_OINTMENT | Freq: Every day | CUTANEOUS | 1 refills | Status: DC

## 2022-02-05 NOTE — Progress Notes (Signed)
   Follow-Up Visit   Subjective  Emma Barker is a 56 y.o. female who presents for the following: Cyst (Left infraorbital cheek - Excise today).  The following portions of the chart were reviewed this encounter and updated as appropriate:   Tobacco  Allergies  Meds  Problems  Med Hx  Surg Hx  Fam Hx     Review of Systems:  No other skin or systemic complaints except as noted in HPI or Assessment and Plan.  Objective  Well appearing patient in no apparent distress; mood and affect are within normal limits.  A focused examination was performed including face. Relevant physical exam findings are noted in the Assessment and Plan.  Left infraorbital cheek Cystic pap 1.2cm   Assessment & Plan  Neoplasm of skin Left infraorbital cheek  Skin excision  Lesion length (cm):  1.2 Lesion width (cm):  1.2 Margin per side (cm):  0 Total excision diameter (cm):  1.2 Informed consent: discussed and consent obtained   Timeout: patient name, date of birth, surgical site, and procedure verified   Procedure prep:  Patient was prepped and draped in usual sterile fashion Prep type:  Isopropyl alcohol and povidone-iodine Anesthesia: the lesion was anesthetized in a standard fashion   Anesthetic:  1% lidocaine w/ epinephrine 1-100,000 buffered w/ 8.4% NaHCO3 (3cc lido w/ epi) Instrument used comment:  #15c blade Hemostasis achieved with: pressure   Hemostasis achieved with comment:  Electrocautery Outcome: patient tolerated procedure well with no complications   Post-procedure details: sterile dressing applied and wound care instructions given   Dressing type: bandage and pressure dressing (Mupirocin)    Skin repair Complexity:  Complex Final length (cm):  1.5 Reason for type of repair: reduce tension to allow closure, reduce the risk of dehiscence, infection, and necrosis, reduce subcutaneous dead space and avoid a hematoma, allow closure of the large defect, preserve normal  anatomy, preserve normal anatomical and functional relationships and enhance both functionality and cosmetic results   Undermining: area extensively undermined   Undermining comment:  Undermining Defect 1.2cm Subcutaneous layers (deep stitches):  Suture size:  5-0 Suture type: Vicryl (polyglactin 910)   Subcutaneous suture technique: Inverted Dermal. Fine/surface layer approximation (top stitches):  Suture size:  5-0 Suture type: nylon   Stitches: simple interrupted   Suture removal (days):  7 Hemostasis achieved with: pressure Outcome: patient tolerated procedure well with no complications   Post-procedure details: sterile dressing applied and wound care instructions given   Dressing type: bandage, pressure dressing and bacitracin (Mupirocin)    mupirocin ointment (BACTROBAN) 2 % Apply 1 application  topically daily. Qd to excision site  Specimen 1 - Surgical pathology Differential Diagnosis: Cyst vs other Check Margins: No Cystic pap 1.2cm  Cyst vs other, excised today Start Mupirocin oint qd to excision site   Return in about 1 week (around 02/12/2022) for suture removal.  I, Othelia Pulling, RMA, am acting as scribe for Sarina Ser, MD . Documentation: I have reviewed the above documentation for accuracy and completeness, and I agree with the above.  Sarina Ser, MD

## 2022-02-05 NOTE — Telephone Encounter (Signed)
Pt doing good after today's surgery.Emma Barker

## 2022-02-05 NOTE — Patient Instructions (Signed)

## 2022-02-12 ENCOUNTER — Ambulatory Visit (INDEPENDENT_AMBULATORY_CARE_PROVIDER_SITE_OTHER): Admitting: Dermatology

## 2022-02-12 DIAGNOSIS — L538 Other specified erythematous conditions: Secondary | ICD-10-CM

## 2022-02-12 DIAGNOSIS — Z85828 Personal history of other malignant neoplasm of skin: Secondary | ICD-10-CM

## 2022-02-12 DIAGNOSIS — L720 Epidermal cyst: Secondary | ICD-10-CM

## 2022-02-12 DIAGNOSIS — T300 Burn of unspecified body region, unspecified degree: Secondary | ICD-10-CM

## 2022-02-12 NOTE — Patient Instructions (Signed)
Due to recent changes in healthcare laws, you may see results of your pathology and/or laboratory studies on MyChart before the doctors have had a chance to review them. We understand that in some cases there may be results that are confusing or concerning to you. Please understand that not all results are received at the same time and often the doctors may need to interpret multiple results in order to provide you with the best plan of care or course of treatment. Therefore, we ask that you please give us 2 business days to thoroughly review all your results before contacting the office for clarification. Should we see a critical lab result, you will be contacted sooner.   If You Need Anything After Your Visit  If you have any questions or concerns for your doctor, please call our main line at [phone] and press option 4 to reach your doctor's medical assistant. If no one answers, please leave a voicemail as directed and we will return your call as soon as possible. Messages left after 4 pm will be answered the following business day.   You may also send us a message via MyChart. We typically respond to MyChart messages within 1-2 business days.  For prescription refills, please ask your pharmacy to contact our office. Our fax number is [phone].  If you have an urgent issue when the clinic is closed that cannot wait until the next business day, you can page your doctor at the number below.    Please note that while we do our best to be available for urgent issues outside of office hours, we are not available 24/7.   If you have an urgent issue and are unable to reach us, you may choose to seek medical care at your doctor's office, retail clinic, urgent care center, or emergency room.  If you have a medical emergency, please immediately call 911 or go to the emergency department.  Pager Numbers  - Dr. Kowalski: [phone]  - Dr. Moye: [phone]  - Dr. Stewart:  [phone]  In the event of inclement weather, please call our main line at [phone] for an update on the status of any delays or closures.  Dermatology Medication Tips: Please keep the boxes that topical medications come in in order to help keep track of the instructions about where and how to use these. Pharmacies typically print the medication instructions only on the boxes and not directly on the medication tubes.   If your medication is too expensive, please contact our office at [phone] option 4 or send us a message through MyChart.   We are unable to tell what your co-pay for medications will be in advance as this is different depending on your insurance coverage. However, we may be able to find a substitute medication at lower cost or fill out paperwork to get insurance to cover a needed medication.   If a prior authorization is required to get your medication covered by your insurance company, please allow us 1-2 business days to complete this process.  Drug prices often vary depending on where the prescription is filled and some pharmacies may offer cheaper prices.  The website www.goodrx.com contains coupons for medications through different pharmacies. The prices here do not account for what the cost may be with help from insurance (it may be cheaper with your insurance), but the website can give you the price if you did not use any insurance.  - You can print the associated coupon and take it with   your prescription to the pharmacy.  - You may also stop by our office during regular business hours and pick up a GoodRx coupon card.  - If you need your prescription sent electronically to a different pharmacy, notify our office through [facility] MyChart or by phone at [phone] option 4.     Si Usted Necesita Algo Despus de Su Visita  Tambin puede enviarnos un mensaje a travs de MyChart. Por lo general respondemos a los mensajes de MyChart en el transcurso de 1 a 2  das hbiles.  Para renovar recetas, por favor pida a su farmacia que se ponga en contacto con nuestra oficina. Nuestro nmero de fax es el [phone].  Si tiene un asunto urgente cuando la clnica est cerrada y que no puede esperar hasta el siguiente da hbil, puede llamar/localizar a su doctor(a) al nmero que aparece a continuacin.   Por favor, tenga en cuenta que aunque hacemos todo lo posible para estar disponibles para asuntos urgentes fuera del horario de oficina, no estamos disponibles las 24 horas del da, los 7 das de la semana.   Si tiene un problema urgente y no puede comunicarse con nosotros, puede optar por buscar atencin mdica  en el consultorio de su doctor(a), en una clnica privada, en un centro de atencin urgente o en una sala de emergencias.  Si tiene una emergencia mdica, por favor llame inmediatamente al 911 o vaya a la sala de emergencias.  Nmeros de bper  - Dr. Kowalski: [phone]  - Dra. Moye: [phone]  - Dra. Stewart: [phone]  En caso de inclemencias del tiempo, por favor llame a nuestra lnea principal al [phone] para una actualizacin sobre el estado de cualquier retraso o cierre.  Consejos para la medicacin en dermatologa: Por favor, guarde las cajas en las que vienen los medicamentos de uso tpico para ayudarle a seguir las instrucciones sobre dnde y cmo usarlos. Las farmacias generalmente imprimen las instrucciones del medicamento slo en las cajas y no directamente en los tubos del medicamento.   Si su medicamento es muy caro, por favor, pngase en contacto con nuestra oficina llamando al [phone] y presione la opcin 4 o envenos un mensaje a travs de MyChart.   No podemos decirle cul ser su copago por los medicamentos por adelantado ya que esto es diferente dependiendo de la cobertura de su seguro. Sin embargo, es posible que podamos encontrar un medicamento sustituto a menor costo o llenar un formulario para que el  seguro cubra el medicamento que se considera necesario.   Si se requiere una autorizacin previa para que su compaa de seguros cubra su medicamento, por favor permtanos de 1 a 2 das hbiles para completar este proceso.  Los precios de los medicamentos varan con frecuencia dependiendo del lugar de dnde se surte la receta y alguna farmacias pueden ofrecer precios ms baratos.  El sitio web www.goodrx.com tiene cupones para medicamentos de diferentes farmacias. Los precios aqu no tienen en cuenta lo que podra costar con la ayuda del seguro (puede ser ms barato con su seguro), pero el sitio web puede darle el precio si no utiliz ningn seguro.  - Puede imprimir el cupn correspondiente y llevarlo con su receta a la farmacia.  - Tambin puede pasar por nuestra oficina durante el horario de atencin regular y recoger una tarjeta de cupones de GoodRx.  - Si necesita que su receta se enve electrnicamente a una farmacia diferente, informe a nuestra oficina a travs de MyChart de [place]   o por telfono llamando al [phone] y presione la opcin 4.  

## 2022-02-12 NOTE — Progress Notes (Signed)
   Follow-Up Visit   Subjective  Emma Barker is a 56 y.o. female who presents for the following: Suture / Staple Removal (Biopsy proven Epidermal cyst at left infraorbital cheek 02/05/22 suture removal today) and Skin Problem (Burn on the right arm from cooking in the oven 1 day ago, treating with otc Aloe vera gel).  The following portions of the chart were reviewed this encounter and updated as appropriate:   Tobacco  Allergies  Meds  Problems  Med Hx  Surg Hx  Fam Hx     Review of Systems:  No other skin or systemic complaints except as noted in HPI or Assessment and Plan.  Objective  Well appearing patient in no apparent distress; mood and affect are within normal limits.  A focused examination was performed including face,right arm. Relevant physical exam findings are noted in the Assessment and Plan.  Head - Anterior (Face) Well healed scar  Right Forearm - Anterior Edema and erythema    Assessment & Plan  Epidermal inclusion cyst Head - Anterior (Face)  Biopsy results discussed   Encounter for Removal of Sutures - Incision site at the left infraorbital  is clean, dry and intact - Wound cleansed, sutures removed, wound cleansed and steri strips applied.  - Discussed pathology results showing Epidermal inclusion cyst   - Patient advised to keep steri-strips dry until they fall off. - Scars remodel for a full year. - Once steri-strips fall off, patient can apply over-the-counter silicone scar cream each night to help with scar remodeling if desired. - Patient advised to call with any concerns or if they notice any new or changing lesions.   Burn erythema Right Forearm - Anterior  Start Mupirocin ointment apply to affected skin qd-bid Can start Triamcinolone 0.1% cream apply to irritated skin qd prn  Patient already has both of these prescriptions  History of Squamous Cell Carcinoma of the Skin Left forehead above mid brow - No evidence of recurrence  today - No lymphadenopathy - Recommend regular full body skin exams - Recommend daily broad spectrum sunscreen SPF 30+ to sun-exposed areas, reapply every 2 hours as needed.  - Call if any new or changing lesions are noted between office visits  Return in about 8 months (around 10/15/2022) for TBSE hx of SCC.  IMarye Round, CMA, am acting as scribe for Sarina Ser, MD .  Documentation: I have reviewed the above documentation for accuracy and completeness, and I agree with the above.  Sarina Ser, MD

## 2022-02-14 ENCOUNTER — Encounter: Payer: Self-pay | Admitting: Dermatology

## 2022-10-10 ENCOUNTER — Ambulatory Visit: Admitting: Family Medicine

## 2022-10-16 ENCOUNTER — Ambulatory Visit
Admission: RE | Admit: 2022-10-16 | Discharge: 2022-10-16 | Disposition: A | Discharge status: Home / Self Care | Source: Ambulatory Visit | Attending: Internal Medicine | Admitting: Internal Medicine

## 2022-10-16 ENCOUNTER — Other Ambulatory Visit: Payer: Self-pay | Admitting: Internal Medicine

## 2022-10-16 ENCOUNTER — Ambulatory Visit (INDEPENDENT_AMBULATORY_CARE_PROVIDER_SITE_OTHER): Admitting: Dermatology

## 2022-10-16 VITALS — BP 146/81 | HR 83

## 2022-10-16 DIAGNOSIS — L700 Acne vulgaris: Secondary | ICD-10-CM | POA: Diagnosis not present

## 2022-10-16 DIAGNOSIS — M7121 Synovial cyst of popliteal space [Baker], right knee: Secondary | ICD-10-CM

## 2022-10-16 DIAGNOSIS — Z1283 Encounter for screening for malignant neoplasm of skin: Secondary | ICD-10-CM | POA: Diagnosis not present

## 2022-10-16 DIAGNOSIS — D1801 Hemangioma of skin and subcutaneous tissue: Secondary | ICD-10-CM

## 2022-10-16 DIAGNOSIS — L918 Other hypertrophic disorders of the skin: Secondary | ICD-10-CM

## 2022-10-16 DIAGNOSIS — M7989 Other specified soft tissue disorders: Secondary | ICD-10-CM

## 2022-10-16 DIAGNOSIS — Z85828 Personal history of other malignant neoplasm of skin: Secondary | ICD-10-CM

## 2022-10-16 DIAGNOSIS — Z8589 Personal history of malignant neoplasm of other organs and systems: Secondary | ICD-10-CM

## 2022-10-16 DIAGNOSIS — L821 Other seborrheic keratosis: Secondary | ICD-10-CM

## 2022-10-16 DIAGNOSIS — L814 Other melanin hyperpigmentation: Secondary | ICD-10-CM

## 2022-10-16 DIAGNOSIS — L578 Other skin changes due to chronic exposure to nonionizing radiation: Secondary | ICD-10-CM

## 2022-10-16 DIAGNOSIS — D229 Melanocytic nevi, unspecified: Secondary | ICD-10-CM

## 2022-10-16 DIAGNOSIS — Z7189 Other specified counseling: Secondary | ICD-10-CM

## 2022-10-16 DIAGNOSIS — Z79899 Other long term (current) drug therapy: Secondary | ICD-10-CM

## 2022-10-16 MED ORDER — ADAPALENE 0.3 % EX GEL: 1.0000 | Freq: Every day | CUTANEOUS | 4 refills | Status: DC

## 2022-10-16 NOTE — Patient Instructions (Addendum)
For Acne Start Adapalene 0.3% gel nightly to face as tolerated, pea size amount Recommend PanOxyl wash daily  Topical retinoid medications like tretinoin/Retin-A, adapalene/Differin, tazarotene/Fabior, and Epiduo/Epiduo Forte can cause dryness and irritation when first started. Only apply a pea-sized amount to the entire affected area. Avoid applying it around the eyes, edges of mouth and creases at the nose. If you experience irritation, use a good moisturizer first and/or apply the medicine less often. If you are doing well with the medicine, you can increase how often you use it until you are applying every night. Be careful with sun protection while using this medication as it can make you sensitive to the sun. This medicine should not be used by pregnant women.    Benzoyl peroxide can cause dryness and irritation of the skin. It can also bleach fabric. When used together with Aczone (dapsone) cream, it can stain the skin orange.     Due to recent changes in healthcare laws, you may see results of your pathology and/or laboratory studies on MyChart before the doctors have had a chance to review them. We understand that in some cases there may be results that are confusing or concerning to you. Please understand that not all results are received at the same time and often the doctors may need to interpret multiple results in order to provide you with the best plan of care or course of treatment. Therefore, we ask that you please give [place] 2 business days to thoroughly review all your results before contacting the office for clarification. Should we see a critical lab result, you will be contacted sooner.   If You Need Anything After Your Visit  If you have any questions or concerns for your doctor, please call our main line at [phone] and press option 4 to reach your doctor's medical assistant. If no one answers, please leave a voicemail as directed and we will return your call as soon as  possible. Messages left after 4 pm will be answered the following business day.   You may also send [place] a message via [place]. We typically respond to MyChart messages within 1-2 business days.  For prescription refills, please ask your pharmacy to contact our office. Our fax number is [phone].  If you have an urgent issue when the clinic is closed that cannot wait until the next business day, you can page your doctor at the number below.    Please note that while we do our best to be available for urgent issues outside of office hours, we are not available 24/7.   If you have an urgent issue and are unable to reach [place], you may choose to seek medical care at your doctor's office, retail clinic, urgent care center, or emergency room.  If you have a medical emergency, please immediately call 911 or go to the emergency department.  Pager Numbers  - Dr. Nehemiah Massed: [phone]  - Dr. Laurence Ferrari: [phone]  - Dr. Nicole Kindred: [phone]  In the event of inclement weather, please call our main line at [phone] for an update on the status of any delays or closures.  Dermatology Medication Tips: Please keep the boxes that topical medications come in in order to help keep track of the instructions about where and how to use these. Pharmacies typically print the medication instructions only on the boxes and not directly on the medication tubes.   If your medication is too expensive, please contact our office at [phone] option 4 or send [place] a  message through [place].   We are unable to tell what your co-pay for medications will be in advance as this is different depending on your insurance coverage. However, we may be able to find a substitute medication at lower cost or fill out paperwork to get insurance to cover a needed medication.   If a prior authorization is required to get your medication covered by your insurance company, please allow [place] 1-2 business days to complete this  process.  Drug prices often vary depending on where the prescription is filled and some pharmacies may offer cheaper prices.  The website www.goodrx.com contains coupons for medications through different pharmacies. The prices here do not account for what the cost may be with help from insurance (it may be cheaper with your insurance), but the website can give you the price if you did not use any insurance.  - You can print the associated coupon and take it with your prescription to the pharmacy.  - You may also stop by our office during regular business hours and pick up a GoodRx coupon card.  - If you need your prescription sent electronically to a different pharmacy, notify our office through [facility] or by phone at [phone] option 4.     Si Usted Necesita Algo Despus de Su Visita  Tambin puede enviarnos un mensaje a travs de Pharmacist, community. Por lo general respondemos a los mensajes de MyChart en el transcurso de 1 a 2 das hbiles.  Para renovar recetas, por favor pida a su farmacia que se ponga en contacto con nuestra oficina. Harland Dingwall de fax es [place] [phone].  Si tiene un asunto urgente cuando la clnica est cerrada y que no puede esperar hasta el siguiente da hbil, puede llamar/localizar a su doctor(a) al nmero que aparece a continuacin.   Por favor, tenga en cuenta que aunque hacemos todo lo posible para estar disponibles para asuntos urgentes fuera del horario de [place], no estamos disponibles las 24 horas del da, los 7 das de la [place].   Si tiene un problema urgente y no puede comunicarse con nosotros, puede optar por buscar atencin mdica  en el consultorio de su doctor(a), en una clnica privada, en un centro de atencin urgente o en una sala de emergencias.  Si tiene Engineering geologist, por favor llame inmediatamente al 911 o vaya a la sala de emergencias.  Nmeros de bper  - Dr. Nehemiah Massed: [phone]  - Dra. Moye: [phone]  - Dra.  Nicole Kindred: [phone]  En caso de inclemencias del [place], por favor llame a Johnsie Kindred principal al [phone] para una actualizacin sobre el [place] de cualquier retraso o cierre.  Consejos para la medicacin en dermatologa: Por favor, guarde las cajas en las que vienen los medicamentos de uso tpico para ayudarle a seguir las instrucciones sobre dnde y cmo usarlos. Las farmacias generalmente imprimen las instrucciones del medicamento slo en las cajas y no directamente en los tubos del [place].   Si su medicamento es muy caro, por favor, pngase en contacto con Zigmund Daniel llamando al [phone] y presione la opcin 4 o envenos un mensaje a travs de Pharmacist, community.   No podemos decirle cul ser su copago por los medicamentos por adelantado ya que esto es diferente dependiendo de la cobertura de su seguro. Sin embargo, es posible que podamos encontrar un medicamento sustituto a Electrical engineer un formulario para que el seguro cubra el medicamento que se considera necesario.   Si se requiere [employer]  autorizacin previa para que su compaa de seguros [place] su medicamento, por favor permtanos de 1 a 2 das hbiles para completar este proceso.  Los precios de los medicamentos varan con frecuencia dependiendo del Environmental consultant de dnde se surte la receta y alguna farmacias pueden ofrecer precios ms baratos.  El sitio web www.goodrx.com tiene cupones para medicamentos de Airline pilot. Los precios aqu no tienen en cuenta lo que podra costar con la ayuda del seguro (puede ser ms barato con su seguro), pero el sitio web puede darle el precio si no utiliz Research scientist (physical sciences).  - Puede imprimir el cupn correspondiente y llevarlo con su receta a la farmacia.  - Tambin puede pasar por nuestra oficina durante el horario de atencin regular y Charity fundraiser una tarjeta de cupones de GoodRx.  - Si necesita que su receta se enve electrnicamente a una farmacia diferente, informe a nuestra oficina a  travs de MyChart de [place] o por telfono llamando al [phone] y presione la opcin 4.

## 2022-10-16 NOTE — Progress Notes (Signed)
Follow-Up Visit   Subjective  Emma Barker is a 57 y.o. female who presents for the following: Total body skin exam (Hx of SCC L forehead above mid brow), Skin Tag (L axilla, getting larger), and Cyst (R popliteal, 5 days). The patient presents for Total-Body Skin Exam (TBSE) for skin cancer screening and mole check.  The patient has spots, moles and lesions to be evaluated, some may be new or changing and the patient has concerns that these could be cancer.   The following portions of the chart were reviewed this encounter and updated as appropriate:   Tobacco  Allergies  Meds  Problems  Med Hx  Surg Hx  Fam Hx     Review of Systems:  No other skin or systemic complaints except as noted in HPI or Assessment and Plan.  Objective  Well appearing patient in no apparent distress; mood and affect are within normal limits.  A full examination was performed including scalp, head, eyes, ears, nose, lips, neck, chest, axillae, abdomen, back, buttocks, bilateral upper extremities, bilateral lower extremities, hands, feet, fingers, toes, fingernails, and toenails. All findings within normal limits unless otherwise noted below.  face Paps chin, scarring face  Right Popliteal Fossa Tenders R popliteal  Left Axilla Fleshy, skin-colored pedunculated papules.     Assessment & Plan   Lentigines - Scattered tan macules - Due to sun exposure - Benign-appearing, observe - Recommend daily broad spectrum sunscreen SPF 30+ to sun-exposed areas, reapply every 2 hours as needed. - Call for any changes - upper back  Seborrheic Keratoses - Stuck-on, waxy, tan-brown papules and/or plaques  - Benign-appearing - Discussed benign etiology and prognosis. - Observe - Call for any changes - trunk, arms  Melanocytic Nevi - Tan-brown and/or pink-flesh-colored symmetric macules and papules - Benign appearing on exam today - Observation - Call clinic for new or changing moles - Recommend  daily use of broad spectrum spf 30+ sunscreen to sun-exposed areas.   Hemangiomas - Red papules - Discussed benign nature - Observe - Call for any changes  Actinic Damage - Chronic condition, secondary to cumulative UV/sun exposure - diffuse scaly erythematous macules with underlying dyspigmentation - Recommend daily broad spectrum sunscreen SPF 30+ to sun-exposed areas, reapply every 2 hours as needed.  - Staying in the shade or wearing long sleeves, sun glasses (UVA+UVB protection) and wide brim hats (4-inch brim around the entire circumference of the hat) are also recommended for sun protection.  - Call for new or changing lesions.  Skin cancer screening performed today.   History of Squamous Cell Carcinoma of the Skin - No evidence of recurrence today - No lymphadenopathy - Recommend regular full body skin exams - Recommend daily broad spectrum sunscreen SPF 30+ to sun-exposed areas, reapply every 2 hours as needed.  - Call if any new or changing lesions are noted between office visits - L forehead above mid brow  Acne vulgaris face Discussed low dose Doxycycline, Isotretinoin pt declines Start Adapalene 0.3% gel qhs to face Recommend PanOxyl wash qd\  Adapalene (DIFFERIN) 0.3 % gel - face Apply 1 Application topically at bedtime. Pea size amount to face nightly as tolerated  Synovial cyst of right popliteal space Right Popliteal Fossa Possible Bakers Cyst Recommend pt f/u with PCP  Skin tag Left Axilla Discussed Snip excision Will check to see if covered by patients insurance  Return in about 1 year (around 10/17/2023) for TBSE.  I, Othelia Pulling, RMA, am acting as scribe for  Sarina Ser, MD . Documentation: I have reviewed the above documentation for accuracy and completeness, and I agree with the above.  Sarina Ser, MD

## 2022-10-22 ENCOUNTER — Encounter: Payer: Self-pay | Admitting: Dermatology

## 2022-12-03 ENCOUNTER — Ambulatory Visit: Admitting: Dermatology

## 2022-12-03 ENCOUNTER — Encounter: Payer: Self-pay | Admitting: Dermatology

## 2022-12-03 VITALS — BP 129/81 | HR 86

## 2022-12-03 DIAGNOSIS — L578 Other skin changes due to chronic exposure to nonionizing radiation: Secondary | ICD-10-CM

## 2022-12-03 DIAGNOSIS — L821 Other seborrheic keratosis: Secondary | ICD-10-CM

## 2022-12-03 NOTE — Progress Notes (Signed)
   Follow-Up Visit   Subjective  Emma Barker is a 57 y.o. female who presents for the following: spot noticed after last appointment at left side of back near bra line. Looks darker has darker spots rough feeling. Has taken pictures.   The following portions of the chart were reviewed this encounter and updated as appropriate: medications, allergies, medical history  Review of Systems:  No other skin or systemic complaints except as noted in HPI or Assessment and Plan.  Objective  Well appearing patient in no apparent distress; mood and affect are within normal limits.  A focused examination was performed of the following areas: back  Relevant exam findings are noted in the Assessment and Plan.    Assessment & Plan   SEBORRHEIC KERATOSIS At left mid back - Stuck-on, waxy, tan-brown papules and/or plaques  - Benign-appearing - Discussed benign etiology and prognosis. - Observe - Call for any changes   ACTINIC DAMAGE - chronic, secondary to cumulative UV radiation exposure/sun exposure over time - diffuse scaly erythematous macules with underlying dyspigmentation - Recommend daily broad spectrum sunscreen SPF 30+ to sun-exposed areas, reapply every 2 hours as needed.  - Recommend staying in the shade or wearing long sleeves, sun glasses (UVA+UVB protection) and wide brim hats (4-inch brim around the entire circumference of the hat). - Call for new or changing lesions.   Return for keep follow up as scheduled .  I, Asher Muir, CMA, am acting as scribe for Darden Dates, MD.   Documentation: I have reviewed the above documentation for accuracy and completeness, and I agree with the above.  Darden Dates, MD

## 2022-12-03 NOTE — Patient Instructions (Addendum)
Seborrheic Keratosis  What causes seborrheic keratoses? Seborrheic keratoses are harmless, common skin growths that first appear during adult life.  As time goes by, more growths appear.  Some people may develop a large number of them.  Seborrheic keratoses appear on both covered and uncovered body parts.  They are not caused by sunlight.  The tendency to develop seborrheic keratoses can be inherited.  They vary in color from skin-colored to gray, brown, or even black.  They can be either smooth or have a rough, warty surface.   Seborrheic keratoses are superficial and look as if they were stuck on the skin.  Under the microscope this type of keratosis looks like layers upon layers of skin.  That is why at times the top layer may seem to fall off, but the rest of the growth remains and re-grows.    Treatment Seborrheic keratoses do not need to be treated, but can easily be removed in the office.  Seborrheic keratoses often cause symptoms when they rub on clothing or jewelry.  Lesions can be in the way of shaving.  If they become inflamed, they can cause itching, soreness, or burning.  Removal of a seborrheic keratosis can be accomplished by freezing, burning, or surgery. If any spot bleeds, scabs, or grows rapidly, please return to have it checked, as these can be an indication of a skin cancer.   Recommend taking Heliocare sun protection supplement daily in sunny weather for additional sun protection. For maximum protection on the sunniest days, you can take up to 2 capsules of regular Heliocare OR take 1 capsule of Heliocare Ultra. For prolonged exposure (such as a full day in the sun), you can repeat your dose of the supplement 4 hours after your first dose. Heliocare can be purchased at [facility], at some [facility] or at www.heliocare.com.      Recommend daily broad spectrum sunscreen SPF 30+ to sun-exposed areas, reapply every 2 hours as needed. Call for new or changing lesions.   Staying in the shade or wearing long sleeves, sun glasses (UVA+UVB protection) and wide brim hats (4-inch brim around the entire circumference of the hat) are also recommended for sun protection.      Due to recent changes in healthcare laws, you may see results of your pathology and/or laboratory studies on MyChart before the doctors have had a chance to review them. We understand that in some cases there may be results that are confusing or concerning to you. Please understand that not all results are received at the same time and often the doctors may need to interpret multiple results in order to provide you with the best plan of care or course of treatment. Therefore, we ask that you please give us 2 business days to thoroughly review all your results before contacting the office for clarification. Should we see a critical lab result, you will be contacted sooner.   If You Need Anything After Your Visit  If you have any questions or concerns for your doctor, please call our main line at [phone] and press option 4 to reach your doctor's medical assistant. If no one answers, please leave a voicemail as directed and we will return your call as soon as possible. Messages left after 4 pm will be answered the following business day.   You may also send us a message via MyChart. We typically respond to MyChart messages within 1-2 business days.  For prescription refills, please ask your pharmacy to contact our   office. Our fax number is [phone].  If you have an urgent issue when the clinic is closed that cannot wait until the next business day, you can page your doctor at the number below.    Please note that while we do our best to be available for urgent issues outside of office hours, we are not available 24/7.   If you have an urgent issue and are unable to reach us, you may choose to seek medical care at your doctor's office, retail clinic, urgent care center, or emergency  room.  If you have a medical emergency, please immediately call 911 or go to the emergency department.  Pager Numbers  - Dr. Kowalski: [phone]  - Dr. Moye: [phone]  - Dr. Stewart: [phone]  In the event of inclement weather, please call our main line at [phone] for an update on the status of any delays or closures.  Dermatology Medication Tips: Please keep the boxes that topical medications come in in order to help keep track of the instructions about where and how to use these. Pharmacies typically print the medication instructions only on the boxes and not directly on the medication tubes.   If your medication is too expensive, please contact our office at [phone] option 4 or send us a message through MyChart.   We are unable to tell what your co-pay for medications will be in advance as this is different depending on your insurance coverage. However, we may be able to find a substitute medication at lower cost or fill out paperwork to get insurance to cover a needed medication.   If a prior authorization is required to get your medication covered by your insurance company, please allow us 1-2 business days to complete this process.  Drug prices often vary depending on where the prescription is filled and some pharmacies may offer cheaper prices.  The website www.goodrx.com contains coupons for medications through different pharmacies. The prices here do not account for what the cost may be with help from insurance (it may be cheaper with your insurance), but the website can give you the price if you did not use any insurance.  - You can print the associated coupon and take it with your prescription to the pharmacy.  - You may also stop by our office during regular business hours and pick up a GoodRx coupon card.  - If you need your prescription sent electronically to a different pharmacy, notify our office through [facility] MyChart or by phone at [phone]  option 4.     Si Usted Necesita Algo Despus de Su Visita  Tambin puede enviarnos un mensaje a travs de MyChart. Por lo general respondemos a los mensajes de MyChart en el transcurso de 1 a 2 das hbiles.  Para renovar recetas, por favor pida a su farmacia que se ponga en contacto con nuestra oficina. Nuestro nmero de fax es el [phone].  Si tiene un asunto urgente cuando la clnica est cerrada y que no puede esperar hasta el siguiente da hbil, puede llamar/localizar a su doctor(a) al nmero que aparece a continuacin.   Por favor, tenga en cuenta que aunque hacemos todo lo posible para estar disponibles para asuntos urgentes fuera del horario de oficina, no estamos disponibles las 24 horas del da, los 7 das de la semana.   Si tiene un problema urgente y no puede comunicarse con nosotros, puede optar por buscar atencin mdica  en el consultorio de su doctor(a), en una clnica privada, en   un centro de atencin urgente o en una sala de emergencias.  Si tiene una emergencia mdica, por favor llame inmediatamente al 911 o vaya a la sala de emergencias.  Nmeros de bper  - Dr. Kowalski: [phone]  - Dra. Moye: [phone]  - Dra. Stewart: [phone]  En caso de inclemencias del tiempo, por favor llame a nuestra lnea principal al [phone] para una actualizacin sobre el estado de cualquier retraso o cierre.  Consejos para la medicacin en dermatologa: Por favor, guarde las cajas en las que vienen los medicamentos de uso tpico para ayudarle a seguir las instrucciones sobre dnde y cmo usarlos. Las farmacias generalmente imprimen las instrucciones del medicamento slo en las cajas y no directamente en los tubos del medicamento.   Si su medicamento es muy caro, por favor, pngase en contacto con nuestra oficina llamando al [phone] y presione la opcin 4 o envenos un mensaje a travs de MyChart.   No podemos decirle cul ser su copago por los medicamentos  por adelantado ya que esto es diferente dependiendo de la cobertura de su seguro. Sin embargo, es posible que podamos encontrar un medicamento sustituto a menor costo o llenar un formulario para que el seguro cubra el medicamento que se considera necesario.   Si se requiere una autorizacin previa para que su compaa de seguros cubra su medicamento, por favor permtanos de 1 a 2 das hbiles para completar este proceso.  Los precios de los medicamentos varan con frecuencia dependiendo del lugar de dnde se surte la receta y alguna farmacias pueden ofrecer precios ms baratos.  El sitio web www.goodrx.com tiene cupones para medicamentos de diferentes farmacias. Los precios aqu no tienen en cuenta lo que podra costar con la ayuda del seguro (puede ser ms barato con su seguro), pero el sitio web puede darle el precio si no utiliz ningn seguro.  - Puede imprimir el cupn correspondiente y llevarlo con su receta a la farmacia.  - Tambin puede pasar por nuestra oficina durante el horario de atencin regular y recoger una tarjeta de cupones de GoodRx.  - Si necesita que su receta se enve electrnicamente a una farmacia diferente, informe a nuestra oficina a travs de MyChart de [place] o por telfono llamando al [phone] y presione la opcin 4.  

## 2023-01-28 ENCOUNTER — Ambulatory Visit: Admitting: Family Medicine

## 2023-01-28 VITALS — BP 125/82 | HR 73 | Ht 70.0 in | Wt 217.8 lb

## 2023-01-28 DIAGNOSIS — Z1159 Encounter for screening for other viral diseases: Secondary | ICD-10-CM

## 2023-01-28 DIAGNOSIS — Z114 Encounter for screening for human immunodeficiency virus [HIV]: Secondary | ICD-10-CM

## 2023-01-28 DIAGNOSIS — F39 Unspecified mood [affective] disorder: Secondary | ICD-10-CM

## 2023-01-28 DIAGNOSIS — E7800 Pure hypercholesterolemia, unspecified: Secondary | ICD-10-CM

## 2023-01-28 DIAGNOSIS — L900 Lichen sclerosus et atrophicus: Secondary | ICD-10-CM

## 2023-01-28 DIAGNOSIS — I10 Essential (primary) hypertension: Secondary | ICD-10-CM | POA: Diagnosis not present

## 2023-01-28 DIAGNOSIS — E039 Hypothyroidism, unspecified: Secondary | ICD-10-CM

## 2023-01-28 DIAGNOSIS — E079 Disorder of thyroid, unspecified: Secondary | ICD-10-CM

## 2023-01-28 DIAGNOSIS — E669 Obesity, unspecified: Secondary | ICD-10-CM

## 2023-01-28 DIAGNOSIS — Z1231 Encounter for screening mammogram for malignant neoplasm of breast: Secondary | ICD-10-CM

## 2023-01-28 DIAGNOSIS — M542 Cervicalgia: Secondary | ICD-10-CM

## 2023-01-28 DIAGNOSIS — G8929 Other chronic pain: Secondary | ICD-10-CM

## 2023-01-28 DIAGNOSIS — Z1211 Encounter for screening for malignant neoplasm of colon: Secondary | ICD-10-CM

## 2023-01-28 NOTE — Progress Notes (Signed)
SUBJECTIVE:   Chief Complaint  Patient presents with   New Patient (Initial Visit)   Neck Pain    Pain located in the neck, has been a problem for 6 yrs, symptoms have been numbness and tingling in the fingers within the last 3 months, [facility] stated problem was worse at C[facility]    HPI Presents to clinic to establish care  Concern today for neck pain that has been ongoing x 6 years.  Previously seen by [facility] and was told there was a problem at the C7 level.  He notices some numbness and tingling in her fingers within the last 3 months.  Has not followed up with orthopedics.  Denies any fevers, decreased range of motion, weakness of upper or lower extremities.  Hypertension Asymptomatic.  Blood pressures at home 136/82.  Currently on Hyzaar 100-12.5 mg and tolerating well.  Amlodipine 5 mg daily was just recently added by former PCP.  Denies any lower extremity edema.  Reports had recent echo and seen by cardiology.  Hyperlipidemia Was prescribed Zocor 20 mg however patient has not been taking medication.   Mood disorder Currently on Paxil 20 mg daily, recently decreased from 40 mg.  Denies SI/HI.   Has not followed with psychiatry.  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  Became mildly hypothyroid and was started on levothyroxine which she took for about 2 years and was discontinued in 2015.  Resumed in 2018 before stopping again.  Since that time she has been off medications and is currently followed by Dr. Gershon Crane at [place] [facility].  She continues to remain off thyroid medications.  Follow-up in 12 months.     PERTINENT PMH / PSH: Autoimmune thyroid disease Multinodular goiter OSA Tobacco use Mood disorder Hypertension Hyperlipidemia Chronic gastritis Lichen sclerosus History of right DVT status post fracture right lower extremity History of esophageal dilation History of TAH  Current  tobacco use, 25-year pack history   OBJECTIVE:  BP 125/82 (BP Location: Left Arm, Patient Position: Sitting, Cuff Size: Normal)   Pulse 73   Ht 5\' 10"  (1.778 m)   Wt 217 lb 12.8 oz (98.8 kg)   SpO2 94%   BMI 31.25 kg/m    Physical Exam Vitals reviewed.  Constitutional:      General: She is not in acute distress.    Appearance: She is not ill-appearing.  HENT:     Head: Normocephalic.     Right Ear: Tympanic membrane, ear canal and external ear normal.     Left Ear: Tympanic membrane, ear canal and external ear normal.     Nose: Nose normal.     Mouth/Throat:     Mouth: Mucous membranes are moist.  Eyes:     Extraocular Movements: Extraocular movements intact.     Conjunctiva/sclera: Conjunctivae normal.     Pupils: Pupils are equal, round, and reactive to light.  Neck:     Thyroid: No thyromegaly or thyroid tenderness.     Vascular: No carotid bruit.  Cardiovascular:     Rate and Rhythm: Normal rate and regular rhythm.     Pulses: Normal pulses.     Heart sounds: Normal heart sounds.  Pulmonary:     Effort: Pulmonary effort is normal.     Breath sounds: Normal breath sounds.  Abdominal:     General: Bowel sounds are normal. There is no distension.     Palpations: Abdomen is soft.  Tenderness: There is no abdominal tenderness. There is no right CVA tenderness, left CVA tenderness, guarding or rebound.  Musculoskeletal:        General: Normal range of motion.     Cervical back: Normal range of motion.     Right lower leg: No edema.     Left lower leg: No edema.  Lymphadenopathy:     Cervical: No cervical adenopathy.  Skin:    Capillary Refill: Capillary refill takes less than 2 seconds.  Neurological:     General: No focal deficit present.     Mental Status: She is alert and oriented to person, place, and time. Mental status is at baseline.     Motor: No weakness.  Psychiatric:        Mood and Affect: Mood normal.        Behavior: Behavior normal.         Thought Content: Thought content normal.        Judgment: Judgment normal.       01/28/2023    1:21 PM  Depression screen PHQ 2/9  Decreased Interest 2  Down, Depressed, Hopeless 2  PHQ - 2 Score 4  Altered sleeping 3  Tired, decreased energy 2  Change in appetite 1  Feeling bad or failure about yourself  0  Trouble concentrating 0  Moving slowly or fidgety/restless 0  Suicidal thoughts 0  PHQ-9 Score 10  Difficult doing work/chores Not difficult at all       01/28/2023    1:22 PM  GAD 7 : Generalized Anxiety Score  Nervous, Anxious, on Edge 0  Control/stop worrying 0  Worry too much - different things 0  Trouble relaxing 0  Restless 0  Easily annoyed or irritable 2  Afraid - awful might happen 0  Total GAD 7 Score 2      ASSESSMENT/PLAN:  Essential (primary) hypertension Assessment & Plan: Chronic.  Well-controlled on current medication. Continue amlodipine 5 mg daily Continue Hyzaar 100-12.5 mg daily Check c-Met, CBC today Monitor blood pressure at home, goal less than 140/90 Request recent echo and cardiology records  Orders: -     Comprehensive metabolic panel; Future  Mood disorder [facility]) Assessment & Plan: Chronic.  Currently on Paxil 20 mg daily.  Recently decreased from 40 mg.  Has not been effective.  Side effects of medication. Denies SI/HI. Consider psychiatry referral, patient declined Encouraged CBT Mental health resources provided Will obtain previous health records for review Follow-up 2 months Strict return precautions provided   Need for hepatitis C screening test -     HIV Antibody (routine testing w rflx); Future  Encounter for screening for HIV -     Hepatitis C antibody; Future  Class 1 obesity Assessment & Plan: Chronic issue.   Check lab today  Orders: -     Hemoglobin A1c; Future -     CBC with Differential/Platelet; Future -     VITAMIN D 25 Hydroxy (Vit-D Deficiency, Fractures); Future -     Vitamin B12; Future  Pure  hypercholesterolemia Assessment & Plan: Check fasting lipids  Orders: -     Lipid panel; Future  Acquired hypothyroidism Assessment & Plan: Chronic.  Stable Not currently on medications. Follows with Dr. Gershon Crane at [facility] endocrinology.  Orders: -     TSH; Future  Lichen sclerosus Assessment & Plan: Chronic.  Uses clobetasol 0.05% Follows with OB/GYN   Chronic neck pain Assessment & Plan: Symptoms present x 6 years.  Now with tingling  in fingers x 3 months. No red flags Check labs today Request previous records for imaging Recommend she follow-up with orthopedics Follow-up in 2 months   HCM No indication for continued cervical cancer screening.  Patient had total hysterectomy with removal of cervix and uterus in [place] for fibroids.  2 pelvic ultrasounds have been completed to confirm surgically removed. Mammogram up-to-date.  Due 05/23 COLONOSCOPY 12/18/2018  Negative colon biopsy/Repeat 58yrs/TKT  EGD 12/18/2018  Gastritis/No Repeat/TKT  Tobacco use.  25 pack year history.  Encouraged continuing cessation Received 1 dose of shingles vaccine in 2016.  Decline second vaccine Recommend pneumonia 20 Hepatitis C/HIV screening labs Recommend tetanus vaccine if not up-to-date.  Previous PCP Rexene Agent, NP at preferred primary care Will need to obtain recent healthcare records  PDMP reviewed  Return in about 2 months (around 03/30/2023).  [name], MD

## 2023-01-28 NOTE — Patient Instructions (Addendum)
It was a pleasure meeting you today. Thank you for allowing me to take part in your health care.  Our goals for today as we discussed include:  Recommend Shingles vaccine.  This is a 2 dose series and can be given at your local pharmacy.  Please talk to your pharmacist about this.   Recommend Tetanus Vaccination.  This is given every 10 years.    Referral sent for Mammogram. Please call to schedule appointment. Due 12/2023 [facility] [address] [place], [place] [address] [phone]    Schedule appointment for fasting blood work  Follow up in 2 months   If you have any questions or concerns, please do not hesitate to call the office at [phone].  I look forward to our next visit and until then take care and stay safe.  Regards,   [name], MD   [facility]

## 2023-01-29 ENCOUNTER — Other Ambulatory Visit (INDEPENDENT_AMBULATORY_CARE_PROVIDER_SITE_OTHER)

## 2023-01-29 DIAGNOSIS — Z1159 Encounter for screening for other viral diseases: Secondary | ICD-10-CM

## 2023-01-29 DIAGNOSIS — E669 Obesity, unspecified: Secondary | ICD-10-CM

## 2023-01-29 DIAGNOSIS — E7800 Pure hypercholesterolemia, unspecified: Secondary | ICD-10-CM | POA: Diagnosis not present

## 2023-01-29 DIAGNOSIS — Z114 Encounter for screening for human immunodeficiency virus [HIV]: Secondary | ICD-10-CM

## 2023-01-29 DIAGNOSIS — E079 Disorder of thyroid, unspecified: Secondary | ICD-10-CM

## 2023-01-29 DIAGNOSIS — I10 Essential (primary) hypertension: Secondary | ICD-10-CM

## 2023-01-29 LAB — CBC WITH DIFFERENTIAL/PLATELET
Basophils Absolute: 0.1 10*3/uL (ref 0.0–0.1)
Basophils Relative: 2.1 % (ref 0.0–3.0)
Eosinophils Absolute: 0.2 10*3/uL (ref 0.0–0.7)
Eosinophils Relative: 2.4 % (ref 0.0–5.0)
HCT: 44.4 % (ref 36.0–46.0)
Hemoglobin: 14.8 g/dL (ref 12.0–15.0)
Lymphocytes Relative: 30.3 % (ref 12.0–46.0)
Lymphs Abs: 2.1 10*3/uL (ref 0.7–4.0)
MCHC: 33.3 g/dL (ref 30.0–36.0)
MCV: 93.1 fl (ref 78.0–100.0)
Monocytes Absolute: 0.6 10*3/uL (ref 0.1–1.0)
Monocytes Relative: 8.6 % (ref 3.0–12.0)
Neutro Abs: 4 10*3/uL (ref 1.4–7.7)
Neutrophils Relative %: 56.6 % (ref 43.0–77.0)
Platelets: 315 10*3/uL (ref 150.0–400.0)
RBC: 4.77 Mil/uL (ref 3.87–5.11)
RDW: 13.6 % (ref 11.5–15.5)
WBC: 7 10*3/uL (ref 4.0–10.5)

## 2023-01-29 LAB — TSH: TSH: 3 u[IU]/mL (ref 0.35–5.50)

## 2023-01-29 LAB — COMPREHENSIVE METABOLIC PANEL
ALT: 25 U/L (ref 0–35)
AST: 22 U/L (ref 0–37)
Albumin: 4.3 g/dL (ref 3.5–5.2)
Alkaline Phosphatase: 59 U/L (ref 39–117)
BUN: 13 mg/dL (ref 6–23)
CO2: 26 mEq/L (ref 19–32)
Calcium: 9.5 mg/dL (ref 8.4–10.5)
Chloride: 103 mEq/L (ref 96–112)
Creatinine, Ser: 1.03 mg/dL (ref 0.40–1.20)
GFR: 60.03 mL/min (ref >60.00)
Glucose, Bld: 90 mg/dL (ref 70–99)
Potassium: 3.6 mEq/L (ref 3.5–5.1)
Sodium: 141 mEq/L (ref 135–145)
Total Bilirubin: 1 mg/dL (ref 0.2–1.2)
Total Protein: 7.2 g/dL (ref 6.0–8.3)

## 2023-01-29 LAB — LIPID PANEL
Cholesterol: 217 mg/dL — ABNORMAL HIGH (ref 0–200)
HDL: 46.6 mg/dL (ref >39.00)
LDL Cholesterol: 142 mg/dL — ABNORMAL HIGH (ref 0–99)
NonHDL: 170.04
Total CHOL/HDL Ratio: 5
Triglycerides: 141 mg/dL (ref 0.0–149.0)
VLDL: 28.2 mg/dL (ref 0.0–40.0)

## 2023-01-29 LAB — VITAMIN B12: Vitamin B-12: 372 pg/mL (ref 211–911)

## 2023-01-29 LAB — HEMOGLOBIN A1C: Hgb A1c MFr Bld: 5.6 % (ref 4.6–6.5)

## 2023-01-29 LAB — VITAMIN D 25 HYDROXY (VIT D DEFICIENCY, FRACTURES): VITD: 75.47 ng/mL (ref 30.00–100.00)

## 2023-01-30 LAB — HIV ANTIBODY (ROUTINE TESTING W REFLEX): HIV 1&2 Ab, 4th Generation: NONREACTIVE

## 2023-01-30 LAB — HEPATITIS C ANTIBODY: Hepatitis C Ab: NONREACTIVE

## 2023-02-02 ENCOUNTER — Encounter: Payer: Self-pay | Admitting: Family Medicine

## 2023-02-02 DIAGNOSIS — Z114 Encounter for screening for human immunodeficiency virus [HIV]: Secondary | ICD-10-CM | POA: Insufficient documentation

## 2023-02-02 DIAGNOSIS — F39 Unspecified mood [affective] disorder: Secondary | ICD-10-CM | POA: Insufficient documentation

## 2023-02-02 DIAGNOSIS — Z1211 Encounter for screening for malignant neoplasm of colon: Secondary | ICD-10-CM | POA: Insufficient documentation

## 2023-02-02 DIAGNOSIS — Z1231 Encounter for screening mammogram for malignant neoplasm of breast: Secondary | ICD-10-CM | POA: Insufficient documentation

## 2023-02-02 DIAGNOSIS — Z1159 Encounter for screening for other viral diseases: Secondary | ICD-10-CM | POA: Insufficient documentation

## 2023-02-02 DIAGNOSIS — E669 Obesity, unspecified: Secondary | ICD-10-CM | POA: Insufficient documentation

## 2023-02-02 DIAGNOSIS — G8929 Other chronic pain: Secondary | ICD-10-CM | POA: Insufficient documentation

## 2023-02-02 NOTE — Assessment & Plan Note (Addendum)
Chronic.  Well-controlled on current medication. Continue amlodipine 5 mg daily Continue Hyzaar 100-12.5 mg daily Check c-Met, CBC today Monitor blood pressure at home, goal less than 140/90 Request recent echo and cardiology records

## 2023-02-02 NOTE — Assessment & Plan Note (Addendum)
Chronic.  Currently on Paxil 20 mg daily.  Recently decreased from 40 mg.  Has not been effective.  Side effects of medication. Denies SI/HI. Consider psychiatry referral, patient declined Encouraged CBT Mental health resources provided Will obtain previous health records for review Follow-up 2 months Strict return precautions provided

## 2023-02-02 NOTE — Assessment & Plan Note (Signed)
Check fasting lipids 

## 2023-02-02 NOTE — Assessment & Plan Note (Signed)
Chronic.  Uses clobetasol 0.05% Follows with OB/GYN

## 2023-02-02 NOTE — Assessment & Plan Note (Signed)
Chronic issue.   Check lab today

## 2023-02-02 NOTE — Assessment & Plan Note (Addendum)
Symptoms present x 6 years.  Now with tingling in fingers x 3 months. No red flags Check labs today Request previous records for imaging Recommend she follow-up with orthopedics Follow-up in 2 months

## 2023-02-02 NOTE — Assessment & Plan Note (Signed)
Chronic.  Stable Not currently on medications. Follows with Dr. Gershon Crane at [facility] endocrinology.

## 2023-03-03 ENCOUNTER — Encounter: Payer: Self-pay | Admitting: Obstetrics and Gynecology

## 2023-03-04 ENCOUNTER — Other Ambulatory Visit: Payer: Self-pay | Admitting: Obstetrics and Gynecology

## 2023-03-04 DIAGNOSIS — L900 Lichen sclerosus et atrophicus: Secondary | ICD-10-CM

## 2023-03-04 MED ORDER — CLOBETASOL PROPIONATE 0.05 % EX OINT: TOPICAL_OINTMENT | CUTANEOUS | 0 refills | Status: DC

## 2023-03-04 NOTE — Progress Notes (Signed)
Rx RF clobetasol till annual 8/24

## 2023-03-31 NOTE — Patient Instructions (Incomplete)
It was a pleasure meeting you today. Thank you for allowing me to take part in your health care.  Our goals for today as we discussed include:  Will order ultrasound to check for stones Take ibuprofen 600 mg every 8 hours for pain as needed  Will get some labs today and notify your results.  4 weeks If you have any questions or concerns, please do not hesitate to call the office at [phone].  I look forward to our next visit and until then take care and stay safe.  Regards,   Camisha Srey, MD   [place]  

## 2023-03-31 NOTE — Progress Notes (Signed)
SUBJECTIVE:   Chief Complaint  Patient presents with   Hypertension   HPI Presents to clinic to follow up blood pressure  Concern today for neck pain that has been ongoing x 6 years.  Reports pain is worsening. She notices some numbness and tingling in her fingers within the last 3 months and feels like some movement in neck.  Denies any previous trauma, fevers or weakness in upper extremities.  Previously seen by [facility] and was told there was a problem at the C7 level. She would like xrays of neck today as none were done previously.  Hypertension Asymptomatic. Blood pressure at home has remained normotensive.  Tolerating Amlodipine 5 mg daily and Hyzaar 100-12.5 mg daily.  Reports has not seen Cardiology but ECHO was performed at [facility].  She is not sure why and would like to know results. Denies any shortness of breath, chest pain or heart palpitations.    Hyperlipidemia Has restarted Zocor 20 mg daily and tolerated well.  Mood disorder Currently on Paxil 20 mg daily, recently decreased from 40 mg.  Denies SI/HI.     PERTINENT PMH / PSH: Autoimmune thyroid disease Multinodular goiter OSA Tobacco use Mood disorder Hypertension Hyperlipidemia Chronic gastritis Lichen sclerosus History of right DVT status post fracture right lower extremity History of esophageal dilation History of TAH  Current tobacco use, 25-year pack history   OBJECTIVE:  BP 118/74   Pulse 79   Temp 97.9 F (36.6 C)   Resp 16   Ht 5\' 10"  (1.778 m)   Wt 216 lb 6 oz (98.1 kg)   SpO2 95%   BMI 31.05 kg/m    Physical Exam Vitals reviewed.  Constitutional:      General: She is not in acute distress.    Appearance: Normal appearance. She is normal weight. She is not ill-appearing, toxic-appearing or diaphoretic.  Eyes:     General:        Right eye: No discharge.        Left eye: No discharge.     Conjunctiva/sclera: Conjunctivae normal.  Cardiovascular:     Rate and  Rhythm: Normal rate and regular rhythm.     Heart sounds: Normal heart sounds.  Pulmonary:     Effort: Pulmonary effort is normal.     Breath sounds: Normal breath sounds.  Musculoskeletal:        General: Normal range of motion.     Right elbow: Normal.     Left elbow: Normal.     Right forearm: Normal.     Left forearm: Normal.     Right wrist: Normal.     Left wrist: Normal.     Right hand: Normal. No bony tenderness. Normal strength. Normal sensation. Normal capillary refill. Normal pulse.     Left hand: Normal. No bony tenderness. Normal strength. Normal sensation. Normal capillary refill. Normal pulse.     Cervical back: Full passive range of motion without pain. Spasms and tenderness present. No swelling, edema, erythema, rigidity, bony tenderness or crepitus. Muscular tenderness present. No pain with movement or spinous process tenderness. Normal range of motion.     Thoracic back: Normal.     Lumbar back: Normal.     Comments: Negative Tinel's test Negative Phalen's test Negative Spurling's test  Lymphadenopathy:     Cervical: No cervical adenopathy.  Skin:    General: Skin is warm and dry.  Neurological:     General: No focal deficit present.  Mental Status: She is alert and oriented to person, place, and time. Mental status is at baseline.     Sensory: No sensory deficit.     Motor: No weakness.  Psychiatric:        Mood and Affect: Mood normal.        Behavior: Behavior normal.        Thought Content: Thought content normal.        Judgment: Judgment normal.       04/01/2023    1:02 PM 01/28/2023    1:21 PM  Depression screen PHQ 2/9  Decreased Interest 1 2  Down, Depressed, Hopeless 0 2  PHQ - 2 Score 1 4  Altered sleeping 3 3  Tired, decreased energy 3 2  Change in appetite 0 1  Feeling bad or failure about yourself  0 0  Trouble concentrating 0 0  Moving slowly or fidgety/restless 0 0  Suicidal thoughts 0 0  PHQ-9 Score 7 10  Difficult doing  work/chores Not difficult at all Not difficult at all       04/01/2023    1:02 PM 01/28/2023    1:22 PM  GAD 7 : Generalized Anxiety Score  Nervous, Anxious, on Edge 1 0  Control/stop worrying 0 0  Worry too much - different things 0 0  Trouble relaxing 0 0  Restless 0 0  Easily annoyed or irritable 0 2  Afraid - awful might happen 0 0  Total GAD 7 Score 1 2  Anxiety Difficulty Not difficult at all       ASSESSMENT/PLAN:  Essential (primary) hypertension Assessment & Plan: Chronic.  Well-controlled on current medication. Refill Amlodipine 5 mg daily Refill Hyzaar 100-12.5 mg daily Monitor blood pressure at home, goal less than 140/90 Requested recent ECHO results   Orders: -     Losartan Potassium-HCTZ; Take 1 tablet by mouth daily.  Dispense: 90 tablet; Refill: 3 -     amLODIPine Besylate; Take 1 tablet (5 mg total) by mouth daily.  Dispense: 90 tablet; Refill: 3  Chronic neck pain Assessment & Plan: Chronic Symptoms present x 6 years.  Now with tingling in fingers x 3 months. No red flags.  Trapezius muscle tenderness on exam.  Suspect muscle strain. Recent labs reassuring Trial Baclofen 5-10 mg at bedtime Heat/Ice as needed Tylenol 650 mg TID as needed for discomfort Diclofenac gel 4 times daily Can trial wrist splints at night and limit repetitive motion to help with fingertips numbness.   Xray C-Spine Recommend PT if imaging normal Consider referral to Ortho or Neurosurgery if worsening   Orders: -     DG Cervical Spine Complete; Future -     Baclofen; Take 1 tablet (10 mg total) by mouth 3 (three) times daily.  Dispense: 30 each; Refill: 0  Mood disorder (HCC) Assessment & Plan: Chronic. Denies SI/HI.   Refill Paxil 20 mg daily Encouraged CBT Mental health resources provided   Orders: -     PARoxetine HCl; Take 1 tablet (20 mg total) by mouth daily.  Dispense: 90 tablet; Refill: 3  Hyperlipidemia, unspecified hyperlipidemia type Assessment &  Plan: LDL not at goal<100 Recently restarted Zocor Refill Zocor 20 mg daily  Orders: -     Simvastatin; Take 1 tablet (20 mg total) by mouth at bedtime.  Dispense: 90 tablet; Refill: 3   HCM No indication for continued cervical cancer screening.  Patient had total hysterectomy with removal of cervix and uterus in [place] for fibroids.  2 pelvic ultrasounds have been completed to confirm surgically removed. Mammogram  Due 05/25  Colonoscopy due 12/17/2028  Negative colon biopsy/Repeat 28yrs  Tobacco use.  25 pack year history.  Encouraged continuing cessation Recommend LDCT given age >20 and 25 pack year history tobacco use  Received 1 dose of shingles vaccine in 2016.  Decline second vaccine Recommend pneumonia 20 Hepatitis C/HIV screening labs Recommend tetanus vaccine if not up-to-date.   PDMP reviewed  Return in about 2 weeks (around 04/15/2023), or if symptoms worsen or fail to improve, for PCP.  [name], MD

## 2023-03-31 NOTE — Assessment & Plan Note (Addendum)
Chronic.  Well-controlled on current medication. Refill Amlodipine 5 mg daily Refill Hyzaar 100-12.5 mg daily Monitor blood pressure at home, goal less than 140/90 Requested recent ECHO results

## 2023-04-01 ENCOUNTER — Encounter: Payer: Self-pay | Admitting: Family Medicine

## 2023-04-01 ENCOUNTER — Ambulatory Visit: Admitting: Family Medicine

## 2023-04-01 ENCOUNTER — Ambulatory Visit (INDEPENDENT_AMBULATORY_CARE_PROVIDER_SITE_OTHER)

## 2023-04-01 VITALS — BP 118/74 | HR 79 | Temp 97.9°F | Resp 16 | Ht 70.0 in | Wt 216.4 lb

## 2023-04-01 DIAGNOSIS — M542 Cervicalgia: Secondary | ICD-10-CM | POA: Diagnosis not present

## 2023-04-01 DIAGNOSIS — F39 Unspecified mood [affective] disorder: Secondary | ICD-10-CM

## 2023-04-01 DIAGNOSIS — G8929 Other chronic pain: Secondary | ICD-10-CM

## 2023-04-01 DIAGNOSIS — E785 Hyperlipidemia, unspecified: Secondary | ICD-10-CM

## 2023-04-01 DIAGNOSIS — I10 Essential (primary) hypertension: Secondary | ICD-10-CM

## 2023-04-01 MED ORDER — LOSARTAN POTASSIUM-HCTZ 100-12.5 MG PO TABS: 1.0000 | ORAL_TABLET | Freq: Every day | ORAL | 3 refills | Status: DC

## 2023-04-01 MED ORDER — BACLOFEN 10 MG PO TABS: 10.0000 mg | ORAL_TABLET | Freq: Three times a day (TID) | ORAL | 0 refills | Status: DC

## 2023-04-01 MED ORDER — AMLODIPINE BESYLATE 5 MG PO TABS: 5.0000 mg | ORAL_TABLET | Freq: Every day | ORAL | 3 refills | Status: DC

## 2023-04-01 MED ORDER — SIMVASTATIN 20 MG PO TABS: 20.0000 mg | ORAL_TABLET | Freq: Every day | ORAL | 3 refills | Status: DC

## 2023-04-01 MED ORDER — PAROXETINE HCL 20 MG PO TABS: 20.0000 mg | ORAL_TABLET | Freq: Every day | ORAL | 3 refills | Status: DC

## 2023-04-07 ENCOUNTER — Encounter: Payer: Self-pay | Admitting: Family Medicine

## 2023-04-13 ENCOUNTER — Encounter: Payer: Self-pay | Admitting: Family Medicine

## 2023-04-13 NOTE — Assessment & Plan Note (Signed)
LDL not at goal<100 Recently restarted Zocor Refill Zocor 20 mg daily

## 2023-04-13 NOTE — Assessment & Plan Note (Signed)
Chronic. Denies SI/HI.   Refill Paxil 20 mg daily Encouraged CBT Mental health resources provided

## 2023-04-13 NOTE — Assessment & Plan Note (Signed)
Chronic Symptoms present x 6 years.  Now with tingling in fingers x 3 months. No red flags.  Trapezius muscle tenderness on exam.  Suspect muscle strain. Recent labs reassuring Trial Baclofen 5-10 mg at bedtime Heat/Ice as needed Tylenol 650 mg TID as needed for discomfort Diclofenac gel 4 times daily Can trial wrist splints at night and limit repetitive motion to help with fingertips numbness.   Xray C-Spine Recommend PT if imaging normal Consider referral to Ortho or Neurosurgery if worsening

## 2023-04-15 ENCOUNTER — Ambulatory Visit: Admitting: Family Medicine

## 2023-04-15 ENCOUNTER — Encounter: Payer: Self-pay | Admitting: Internal Medicine

## 2023-04-15 ENCOUNTER — Encounter: Payer: Self-pay | Admitting: Family Medicine

## 2023-04-15 VITALS — BP 126/68 | HR 77 | Temp 98.0°F | Resp 16 | Ht 70.0 in | Wt 216.0 lb

## 2023-04-15 DIAGNOSIS — G8929 Other chronic pain: Secondary | ICD-10-CM

## 2023-04-15 DIAGNOSIS — F39 Unspecified mood [affective] disorder: Secondary | ICD-10-CM | POA: Diagnosis not present

## 2023-04-15 DIAGNOSIS — M542 Cervicalgia: Secondary | ICD-10-CM

## 2023-04-15 DIAGNOSIS — I10 Essential (primary) hypertension: Secondary | ICD-10-CM | POA: Diagnosis not present

## 2023-04-15 DIAGNOSIS — Z720 Tobacco use: Secondary | ICD-10-CM

## 2023-04-15 DIAGNOSIS — E785 Hyperlipidemia, unspecified: Secondary | ICD-10-CM

## 2023-04-15 NOTE — Patient Instructions (Addendum)
It was a pleasure meeting you today. Thank you for allowing me to take part in your health care.  Our goals for today as we discussed include:  Referral sent to PT for neck pain Start Tylenol 650 mg two-three times daily  Stop Baclofen   Use wrist splints as discussed   If you have any questions or concerns, please do not hesitate to call the office at [phone].  I look forward to our next visit and until then take care and stay safe.  Regards,   [name], MD   [facility]

## 2023-04-15 NOTE — Progress Notes (Signed)
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  No soft tissue edema or fracture noted. Unable to take Baclofen due to side effect of feeling loopy.  Was taking three times day, recommended to take at night and start with 5 mg tablets.  Hypertension Doing well on current medication.  Takes Amlodipine 5 mg daily and Hyzaar 100-12.5 mg daily.  Has not seen Cardiology in past.  ECHO was performed by NP at previous practice.  Reviewed recent ECHO, LVH with diastolic dysfunction.  EF >59%.  No valvular disease.  Trace TR, trace pericardial effusion. Carotid duplex minimal stenosis.  Denies any shortness of breath, chest pain or heart palpitations.    Hyperlipidemia Has restarted Zocor 20 mg daily and tolerated well.  Mood disorder Currently on Paxil 20 mg daily, recently decreased from 40 mg.  Denies SI/HI.     PERTINENT PMH / PSH: Autoimmune thyroid disease Multinodular goiter OSA Tobacco use Mood disorder Hypertension Hyperlipidemia Chronic gastritis Lichen sclerosus History of right DVT status post fracture right lower extremity History of esophageal dilation History of TAH  Current tobacco use, 25-year pack history   OBJECTIVE:  BP 126/68   Pulse 77   Temp 98 F (36.7 C)   Resp 16   Ht 5\' 10"  (1.778 m)   Wt 216 lb (98 kg)   SpO2 95%   BMI 30.99 kg/m    Physical Exam Vitals reviewed.  Constitutional:      General: She is not in acute distress.    Appearance: Normal appearance. She is normal weight. She is not ill-appearing, toxic-appearing or diaphoretic.  Eyes:     General:        Right eye: No discharge.        Left eye: No discharge.     Conjunctiva/sclera: Conjunctivae normal.  Cardiovascular:     Rate and Rhythm: Normal rate and regular rhythm.      Heart sounds: Normal heart sounds.  Pulmonary:     Effort: Pulmonary effort is normal.     Breath sounds: Normal breath sounds.  Musculoskeletal:        General: Normal range of motion.     Right elbow: Normal.     Left elbow: Normal.     Right forearm: Normal.     Left forearm: Normal.     Right wrist: Normal.     Left wrist: Normal.     Right hand: Normal. No bony tenderness. Normal strength. Normal sensation. Normal capillary refill. Normal pulse.     Left hand: Normal. No bony tenderness. Normal strength. Normal sensation. Normal capillary refill. Normal pulse.     Cervical back: Full passive range of motion without pain. Spasms present. No swelling, edema, erythema, rigidity, bony tenderness or crepitus. Muscular tenderness present. No pain with movement or spinous process tenderness. Normal range of motion.     Thoracic back: Normal.     Lumbar back: Normal.     Comments: Negative Tinel's test Negative Phalen's test Negative Spurling's test  Lymphadenopathy:     Cervical: No cervical adenopathy.  Skin:    General: Skin is warm and dry.  Neurological:     General: No focal deficit present.     Mental Status: She is alert  and oriented to person, place, and time. Mental status is at baseline.     Sensory: No sensory deficit.     Motor: No weakness.  Psychiatric:        Mood and Affect: Mood normal.        Behavior: Behavior normal.        Thought Content: Thought content normal.        Judgment: Judgment normal.       04/15/2023   11:05 AM 04/01/2023    1:02 PM 01/28/2023    1:21 PM  Depression screen PHQ 2/9  Decreased Interest 0 1 2  Down, Depressed, Hopeless 0 0 2  PHQ - 2 Score 0 1 4  Altered sleeping 2 3 3   Tired, decreased energy 2 3 2   Change in appetite 1 0 1  Feeling bad or failure about yourself  0 0 0  Trouble concentrating 0 0 0  Moving slowly or fidgety/restless 0 0 0  Suicidal thoughts 0 0 0  PHQ-9 Score 5 7 10   Difficult doing work/chores Not  difficult at all Not difficult at all Not difficult at all       04/15/2023   11:05 AM 04/01/2023    1:02 PM 01/28/2023    1:22 PM  GAD 7 : Generalized Anxiety Score  Nervous, Anxious, on Edge 0 1 0  Control/stop worrying 0 0 0  Worry too much - different things 0 0 0  Trouble relaxing 0 0 0  Restless 0 0 0  Easily annoyed or irritable 0 0 2  Afraid - awful might happen 0 0 0  Total GAD 7 Score 0 1 2  Anxiety Difficulty Not difficult at all Not difficult at all       ASSESSMENT/PLAN:  Chronic neck pain Assessment & Plan: Chronic Notable for lower C spine degenerative change, greater  C5-6 and C6-7.  No soft tissue edema or fracture noted. Discontinue Baclofen Heat/Ice as needed Tylenol 650 mg TID as needed for discomfort Diclofenac gel 4 times daily Referral PT     Orders: -     Ambulatory referral to Physical Therapy  Mood disorder [facility]) Assessment & Plan: Chronic. Denies SI/HI.   Continue Paxil 20 mg daily Encouraged CBT Mental health resources provided    Essential (primary) hypertension Assessment & Plan: Chronic.  Well-controlled on current medication. Continue Amlodipine 5 mg daily Continue Hyzaar 100-12.5 mg daily Monitor blood pressure at home, goal less than 140/90 ECHO essentially normal.  If becomes symptomatic can repeat in future   Hyperlipidemia, unspecified hyperlipidemia type Assessment & Plan: LDL not at goal<100 The 10-year ASCVD risk score (Arnett DK, et al., 2019) is: 10.6%  Continue Zocor 20 mg daily    Tobacco use Assessment & Plan: Chronic Encouraged smoking cessation     HCM No indication for continued cervical cancer screening.  Patient had total hysterectomy with removal of cervix and uterus in [place] for fibroids.  2 pelvic ultrasounds have been completed to confirm surgically removed. Mammogram  Due 05/25 Recommend regular self breast exams  Colonoscopy due 12/17/2028  Negative colon biopsy/Repeat 74yrs  Tobacco  use.  25 pack year history.  Encouraged continuing cessation Recommend LDCT given age >45 and 25 pack year history tobacco use  Received 1 dose of shingles vaccine in 2016.  Decline second vaccine Recommend pneumonia 20 Hepatitis C/HIV screening comppleted Recommend tetanus vaccine if not up-to-date.   PDMP reviewed  Return if symptoms worsen or fail to improve, for PCP.  [name], MD

## 2023-04-24 ENCOUNTER — Encounter: Payer: Self-pay | Admitting: Obstetrics and Gynecology

## 2023-04-24 ENCOUNTER — Ambulatory Visit (INDEPENDENT_AMBULATORY_CARE_PROVIDER_SITE_OTHER): Admitting: Obstetrics and Gynecology

## 2023-04-24 VITALS — BP 134/82 | Ht 70.0 in | Wt 214.0 lb

## 2023-04-24 DIAGNOSIS — Z1231 Encounter for screening mammogram for malignant neoplasm of breast: Secondary | ICD-10-CM

## 2023-04-24 DIAGNOSIS — L900 Lichen sclerosus et atrophicus: Secondary | ICD-10-CM

## 2023-04-24 DIAGNOSIS — N941 Unspecified dyspareunia: Secondary | ICD-10-CM

## 2023-04-24 DIAGNOSIS — Z01419 Encounter for gynecological examination (general) (routine) without abnormal findings: Secondary | ICD-10-CM

## 2023-04-24 DIAGNOSIS — Z800 Family history of malignant neoplasm of digestive organs: Secondary | ICD-10-CM

## 2023-04-24 MED ORDER — CLOBETASOL PROPIONATE 0.05 % EX OINT: TOPICAL_OINTMENT | CUTANEOUS | 0 refills | Status: AC

## 2023-04-24 NOTE — Progress Notes (Signed)
PCP: [name], MD   Chief Complaint  Patient presents with   Gynecologic Exam    No concerns    HPI:      [name] is a 57 y.o. [phone] whose LMP was No LMP recorded. Patient has had a hysterectomy., presents today for her annual examination.  Her menses are absent due to TAH in [place] for urinary issues; also s/p bladder sling. No PMB, occas vasomotor sx.    Hx of LS, treats with clobetasol 0.05% crm only with sx for a few days. Has noticed loss of architecture at clitoris.   Sex activity: single partner, contraception - status post hysterectomy. She has pain with insertion; doesn't think there is dryness or that it's related to LS. LLQ from last yr infrequent; had neg GYN u/s 5/23  Last Pap: 12/17/19  Results were: no abnormalities /neg HPV DNA. NO CX.  Last mammogram: 01/16/22 through PCP; Results were normal, repeat in 12 months There is no FH of breast cancer. There is a FH of ovarian cancer in her mat aunt and pancreatic cancer in her mother. Pt is MyRisk neg 5/23; IBIS=6.0%/riskscore=2.8% The patient does occas self-breast exams.  Colonoscopy: 2020 was normal;  Repeat due after 10 years.   Tobacco use: smokes cigs 1 ppd Alcohol use: none No drug use Exercise: not active  She does get adequate calcium and Vitamin D in her diet.  Labs with PCP.   Patient Active Problem List   Diagnosis Date Noted   Mood disorder (HCC) 02/02/2023   Need for hepatitis C screening test 02/02/2023   Encounter for screening for HIV 02/02/2023   Colon cancer screening 02/02/2023   Breast cancer screening by mammogram 02/02/2023   Class 1 obesity 02/02/2023   Chronic neck pain 02/02/2023   Family history of pancreatic cancer 12/19/2021   Mixed stress and urge urinary incontinence 07/31/2018   Lichen sclerosus 07/01/2017   Anxiety and depression 10/11/2015   Gastro-esophageal reflux disease without esophagitis 07/12/2015   Acquired hypothyroidism 06/14/2015    Essential (primary) hypertension 06/14/2015   Female climacteric state 06/14/2015   Hyperlipidemia 06/14/2015    Past Surgical History:  Procedure Laterality Date   ABDOMINAL HYSTERECTOMY     APPENDECTOMY     BREAST EXCISIONAL BIOPSY Right    2014   CATARACT EXTRACTION W/PHACO Right 03/09/2018   Procedure: CATARACT EXTRACTION PHACO AND INTRAOCULAR LENS PLACEMENT (IOC) RIGHT IVA TOPICAL SYMFONY LENS;  Surgeon: Nevada Crane, MD;  Location[facility];  Service: Ophthalmology;  Laterality: Right;   CATARACT EXTRACTION W/PHACO Left 03/30/2018   Procedure: CATARACT EXTRACTION PHACO AND INTRAOCULAR LENS PLACEMENT (IOC) SYMFONY LENS LEFT;  Surgeon: Nevada Crane, MD;  Location: [facility];  Service: Ophthalmology;  Laterality: Left;   CHOLECYSTECTOMY     IMAGE GUIDED SINUS SURGERY Right 05/16/2015   Procedure: IMAGE GUIDED SINUS SURGERY, RIGHT ENDOSCOPIC MAXILLARY ANTROSTOMY, RIGHT ENDOSCOPIC ANTERIOR ETHMOIDECTOMY AND RIGHT ENDOSCOPIC FRONTAL RECESS EXPLORATION;  Surgeon: Geanie Logan, MD;  Location: [facility];  Service: ENT;  Laterality: Right;  GAVE DISK TO CE CE   POLYPECTOMY      Family History  Problem Relation Age of Onset   Pancreatic cancer Mother 56   Stroke Mother    Stroke Father    Lung cancer Brother        spine   Ovarian cancer Maternal Aunt 60   Hematuria Daughter    Breast cancer Neg Hx     Social History  Socioeconomic History   Marital status: Married    Spouse name: Not on file   Number of children: Not on file   Years of education: Not on file   Highest education level: Associate degree: academic program  Occupational History   Not on file  Tobacco Use   Smoking status: Every Day    Current packs/day: 0.00    Average packs/day: 0.8 packs/day for 25.0 years (18.8 ttl pk-yrs)    Types: Cigarettes    Start date: 12/08/1989    Last attempt to quit: 12/09/2014    Years since quitting: 8.3   Smokeless tobacco: Never   Vaping Use   Vaping status: Never Used  Substance and Sexual Activity   Alcohol use: No   Drug use: No   Sexual activity: Yes    Birth control/protection: None, Surgical    Comment: Hysterectomy  Other Topics Concern   Not on file  Social History Narrative   Not on file   Social Determinants of Health   Financial Resource Strain: Low Risk  (03/31/2023)   Overall Financial Resource Strain (CARDIA)    Difficulty of Paying Living Expenses: Not very hard  Food Insecurity: No Food Insecurity (03/31/2023)   Hunger Vital Sign    Worried About Running Out of Food in the Last Year: Never true    Ran Out of Food in the Last Year: Never true  Transportation Needs: No Transportation Needs (03/31/2023)   PRAPARE - Administrator, Civil Service (Medical): No    Lack of Transportation (Non-Medical): No  Physical Activity: Unknown (03/31/2023)   Exercise Vital Sign    Days of Exercise per Week: 0 days    Minutes of Exercise per Session: Not on file  Stress: No Stress Concern Present (03/31/2023)   [employer] of Occupational Health - Occupational Stress Questionnaire    Feeling of Stress : Only a little  Social Connections: Moderately Isolated (03/31/2023)   Social Connection and Isolation Panel [NHANES]    Frequency of Communication with Friends and Family: Three times a week    Frequency of Social Gatherings with Friends and Family: Once a week    Attends Religious Services: Never    Database administrator or Organizations: No    Attends Engineer, structural: Not on file    Marital Status: Married  Catering manager Violence: Not on file     Current Outpatient Medications:    Adapalene (DIFFERIN) 0.3 % gel, Apply 1 Application topically at bedtime. Pea size amount to face nightly as tolerated, Disp: 135 g, Rfl: 4   amLODipine (NORVASC) 5 MG tablet, Take 1 tablet (5 mg total) by mouth daily., Disp: 90 tablet, Rfl: 3   aspirin EC 81 MG tablet, Take by mouth., Disp: ,  Rfl:    azelastine (ASTELIN) 0.1 % nasal spray, Place into both nostrils., Disp: , Rfl:    cholecalciferol (VITAMIN D3) 25 MCG (1000 UNIT) tablet, Take 1,000 Units by mouth daily., Disp: , Rfl:    esomeprazole (NEXIUM) 40 MG capsule, Take 40 mg by mouth daily., Disp: , Rfl:    fluticasone (FLONASE) 50 MCG/ACT nasal spray, Place 1-2 sprays into both nostrils daily., Disp: , Rfl:    losartan-hydrochlorothiazide (HYZAAR) 100-12.5 MG tablet, Take 1 tablet by mouth daily., Disp: 90 tablet, Rfl: 3   Magnesium 250 MG TABS, Take by mouth daily., Disp: , Rfl:    nitroGLYCERIN (NITROSTAT) 0.4 MG SL tablet, nitroglycerin 0.4 mg sublingual tablet, Disp: , Rfl:  PARoxetine (PAXIL) 20 MG tablet, Take 1 tablet (20 mg total) by mouth daily., Disp: 90 tablet, Rfl: 3   simvastatin (ZOCOR) 20 MG tablet, Take 1 tablet (20 mg total) by mouth at bedtime., Disp: 90 tablet, Rfl: 3   clobetasol ointment (TEMOVATE) 0.05 %, Apply to affected once weekly as maintenance, Disp: 45 g, Rfl: 0     ROS:  Review of Systems  Constitutional:  Positive for fatigue. Negative for fever and unexpected weight change.  Respiratory:  Negative for cough, shortness of breath and wheezing.   Cardiovascular:  Negative for chest pain, palpitations and leg swelling.  Gastrointestinal:  Negative for blood in stool, constipation, diarrhea, nausea and vomiting.  Endocrine: Negative for cold intolerance, heat intolerance and polyuria.  Genitourinary:  Positive for dyspareunia. Negative for dysuria, flank pain, frequency, genital sores, hematuria, menstrual problem, pelvic pain, urgency, vaginal bleeding, vaginal discharge and vaginal pain.  Musculoskeletal:  Positive for arthralgias. Negative for back pain, joint swelling and myalgias.  Skin:  Negative for rash.  Neurological:  Negative for dizziness, syncope, light-headedness, numbness and headaches.  Hematological:  Negative for adenopathy.  Psychiatric/Behavioral:  Positive for  agitation. Negative for confusion, sleep disturbance and suicidal ideas. The patient is not nervous/anxious.    BREAST: No symptoms    Objective: BP 134/82   Ht 5\' 10"  (1.778 m)   Wt 214 lb (97.1 kg)   BMI 30.71 kg/m    Physical Exam Constitutional:      Appearance: She is well-developed.  Genitourinary:     Vulva normal.     Genitourinary Comments: UTERUS/CX SURG REM     Right Labia: lesions.     Right Labia: No rash or tenderness.    Left Labia: lesions.     Left Labia: No tenderness or rash.       Vaginal cuff intact.    No vaginal discharge, erythema or tenderness.     Mild vaginal atrophy present.     Right Adnexa: not tender and no mass present.    Left Adnexa: not tender and no mass present.    Cervix is absent.     Uterus is absent.  Breasts:    Right: No mass, nipple discharge, skin change or tenderness.     Left: No mass, nipple discharge, skin change or tenderness.  Neck:     Thyroid: No thyromegaly.  Cardiovascular:     Rate and Rhythm: Normal rate and regular rhythm.     Heart sounds: Normal heart sounds. No murmur heard. Pulmonary:     Effort: Pulmonary effort is normal.     Breath sounds: Normal breath sounds.  Abdominal:     Palpations: Abdomen is soft.     Tenderness: There is abdominal tenderness in the left lower quadrant. There is no guarding or rebound.  Musculoskeletal:        General: Normal range of motion.     Cervical back: Normal range of motion.  Neurological:     General: No focal deficit present.     Mental Status: She is alert and oriented to person, place, and time.     Cranial Nerves: No cranial nerve deficit.  Skin:    General: Skin is warm and dry.  Psychiatric:        Mood and Affect: Mood normal.        Behavior: Behavior normal.        Thought Content: Thought content normal.        Judgment: Judgment normal.  Vitals reviewed.     Assessment/Plan: Encounter for annual routine gynecological  examination  Encounter for screening mammogram for malignant neoplasm of breast - Plan: MM 3D SCREENING MAMMOGRAM BILATERAL BREAST; pt to schedule mammo  Family history of pancreatic cancer--pt is MyRisk neg; no extra screening at this time recommended  Lichen sclerosus - Plan: clobetasol ointment (TEMOVATE) 0.05 %; Rx RF. Pt to use once wkly as maintenance to help prevent further loss of tissue.   Dyspareunia in female--add lubricants, tx for LS. If sx persist, will add vag ERT.    Meds ordered this encounter  Medications   clobetasol ointment (TEMOVATE) 0.05 %    Sig: Apply to affected once weekly as maintenance    Dispense:  45 g    Refill:  0    Order Specific Question:   Supervising Provider    Answer:   Waymon Budge           GYN counsel breast self exam, mammography screening, adequate intake of calcium and vitamin D, diet and exercise    F/U  Return in about 1 year (around 04/23/2024).  Norine Reddington B. Karrine Kluttz, PA-C 04/24/2023 4:39 PM

## 2023-04-24 NOTE — Patient Instructions (Addendum)
I value your feedback and you entrusting us with your care. If you get a [facility] patient survey, I would appreciate you taking the time to let us know about your experience today. Thank you!  [facility] ([place])--[phone]  

## 2023-04-29 ENCOUNTER — Ambulatory Visit: Attending: Family Medicine | Admitting: Physical Therapy

## 2023-04-29 ENCOUNTER — Other Ambulatory Visit: Payer: Self-pay

## 2023-04-29 DIAGNOSIS — M542 Cervicalgia: Secondary | ICD-10-CM | POA: Insufficient documentation

## 2023-04-29 DIAGNOSIS — G8929 Other chronic pain: Secondary | ICD-10-CM | POA: Insufficient documentation

## 2023-04-29 NOTE — Therapy (Signed)
OUTPATIENT PHYSICAL THERAPY CERVICAL EVALUATION   Patient Name: Emma Barker MRN: [id number] DOB:Feb 06, 1966, 57 y.o., female Today's Date: 04/29/2023  END OF SESSION:  PT End of Session - 04/29/23 1524     Visit Number 1    Number of Visits 20    Date for PT Re-Evaluation 07/08/23    Authorization Type [employer]    Authorization - Visit Number 1    Authorization - Number of Visits 20    Progress Note Due on Visit 10    PT Start Time 1435    PT Stop Time 1515    PT Time Calculation (min) 40 min    Activity Tolerance Patient tolerated treatment well    Behavior During Therapy [facility] for tasks assessed/performed             Past Medical History:  Diagnosis Date   Acute ethmoidal sinusitis    Anxiety    agoraphobia   Arthritis    osteo - everywhere   BRCA negative 12/2021   MyRisk neg; IBIS=6.0%/riskscore=2.8%   Chest pain    related to thyroid issues   Dental infection 12/11/2014   Facial cellulitis    Family history of pancreatic cancer 12/2021   My Risk neg   GERD (gastroesophageal reflux disease)    Graves disease    Graves disease    Graves' disease    Graves' disease    Headache    sinus   Hereditary pancreatitis    Hypertension    Hypokalemia    Hypothyroid    Leukocytosis 12/11/2014   Lichen sclerosus of female genitalia    Mitral valve regurgitation    PONV (postoperative nausea and vomiting)    SIRS (systemic inflammatory response syndrome) (HCC) 12/11/2014   Sleep apnea    supposed to use CPAP - no machine   Squamous cell carcinoma of skin 09/21/2019   left forehead above mid brow [facility])   Stress incontinence    Tricuspid valve regurgitation    Von Willebrand's disease (HCC)    Wears contact lenses    Past Surgical History:  Procedure Laterality Date   ABDOMINAL HYSTERECTOMY     APPENDECTOMY     BREAST EXCISIONAL BIOPSY Right    2014   CATARACT EXTRACTION W/PHACO Right 03/09/2018   Procedure: CATARACT EXTRACTION PHACO AND INTRAOCULAR  LENS PLACEMENT (IOC) RIGHT IVA TOPICAL SYMFONY LENS;  Surgeon: Nevada Crane, MD;  Location: [facility];  Service: Ophthalmology;  Laterality: Right;   CATARACT EXTRACTION W/PHACO Left 03/30/2018   Procedure: CATARACT EXTRACTION PHACO AND INTRAOCULAR LENS PLACEMENT (IOC) SYMFONY LENS LEFT;  Surgeon: Nevada Crane, MD;  Location: [facility];  Service: Ophthalmology;  Laterality: Left;   CHOLECYSTECTOMY     IMAGE GUIDED SINUS SURGERY Right 05/16/2015   Procedure: IMAGE GUIDED SINUS SURGERY, RIGHT ENDOSCOPIC MAXILLARY ANTROSTOMY, RIGHT ENDOSCOPIC ANTERIOR ETHMOIDECTOMY AND RIGHT ENDOSCOPIC FRONTAL RECESS EXPLORATION;  Surgeon: Geanie Logan, MD;  Location: [facility];  Service: ENT;  Laterality: Right;  GAVE DISK TO CE CE   POLYPECTOMY     Patient Active Problem List   Diagnosis Date Noted   Mood disorder (HCC) 02/02/2023   Need for hepatitis C screening test 02/02/2023   Encounter for screening for HIV 02/02/2023   Colon cancer screening 02/02/2023   Breast cancer screening by mammogram 02/02/2023   Class 1 obesity 02/02/2023   Chronic neck pain 02/02/2023   Family history of pancreatic cancer 12/19/2021   Mixed stress and urge urinary incontinence 07/31/2018  Lichen sclerosus 07/01/2017   Anxiety and depression 10/11/2015   Gastro-esophageal reflux disease without esophagitis 07/12/2015   Acquired hypothyroidism 06/14/2015   Essential (primary) hypertension 06/14/2015   Female climacteric state 06/14/2015   Hyperlipidemia 06/14/2015    PCP: Dr. [name]    REFERRING PROVIDER: Dr. [name]   REFERRING DIAG: M54.2 (ICD-10-CM) - Neck pain  THERAPY DIAG:  Cervicalgia  Rationale for Evaluation and Treatment: Rehabilitation  ONSET DATE: 08/26/2018   SUBJECTIVE:                                                                                                                                                                                                          SUBJECTIVE STATEMENT: See pertinent history  Hand dominance: Right  PERTINENT HISTORY:  Pt reports that she started to experience worsening neck pain over the past several years that starts in her cervical spine and radiates down bilateral shoulders. She also notes numbness, tingling, and pain that radiates down bilateral UE into thumbs and dorsal wrist. She describes needing to keep arms by her side to avoid exacerbating her pain. She reports having a difficulty time opening jars and difficulty laying flat on her back.   PAIN:  Are you having pain? Yes: NPRS scale: 5/10 Pain location: Cervical spine and poster Pain description: Achy and sharp  Aggravating factors: Shoulder abduction and shoulders being exposed to cold air  Relieving factors: Side Sleeping, volatren gel    PRECAUTIONS: None  RED FLAGS: None     WEIGHT BEARING RESTRICTIONS: No  FALLS:  Has patient fallen in last 6 months? 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   NEXT MD VISIT: Scheduled to come back in a year   OBJECTIVE:   VITALS: BP 134/93  Sp02 98% HR 72   DIAGNOSTIC FINDINGS:  CLINICAL DATA:  Chronic neck pain   EXAM: CERVICAL SPINE - COMPLETE 4+ VIEW   COMPARISON:  None Available.   FINDINGS: Frontal, bilateral oblique, and lateral views of the cervical spine are obtained. There is straightening of the cervical spine from the C2 through C6 levels, with exaggerated kyphosis at the cervicothoracic junction. This is likely degenerative in nature. There are no acute displaced fractures.   Moderate spondylosis at C5-6 and C6-7. Mild facet hypertrophy at the cervicothoracic  junction. Prevertebral soft tissues  are unremarkable. Evaluation of the neural foramina is limited by suboptimal positioning.   IMPRESSION: 1. Lower cervical degenerative changes, greatest at C5-6 and C6-7. 2. No acute fracture.     Electronically Signed   By: Sharlet Salina M.D.   On: 04/07/2023 06:57    PATIENT SURVEYS:  FOTO 58/100 with target 62   COGNITION: Overall cognitive status: Within functional limits for tasks assessed  SENSATION: Light touch: Numbness and tingling on palmer side of right hand   POSTURE: No Significant postural limitations  PALPATION: C7 TTP    CERVICAL ROM:   Active ROM A/PROM (deg) eval  Flexion 45*  Extension 45  Right lateral flexion 35  Left lateral flexion 45  Right rotation 45  Left rotation 60   (Blank rows = not tested)  UPPER EXTREMITY ROM:  Active ROM Right eval Left eval  Shoulder flexion 160 160  Shoulder extension    Shoulder abduction    Shoulder adduction    Shoulder extension    Shoulder internal rotation    Shoulder external rotation    Elbow flexion    Elbow extension    Wrist flexion    Wrist extension    Wrist ulnar deviation    Wrist radial deviation    Wrist pronation    Wrist supination     (Blank rows = not tested)  UPPER EXTREMITY MMT:  MMT Right eval Left eval  Shoulder flexion 4- 4-  Shoulder extension    Shoulder abduction 4 4  Shoulder adduction 4- 4-  Shoulder extension    Shoulder internal rotation    Shoulder external rotation    Middle trapezius NT  NT   Lower trapezius NT  NT   Elbow flexion    Elbow extension    Wrist flexion    Wrist extension    Wrist ulnar deviation    Wrist radial deviation    Wrist pronation    Wrist supination    Grip strength Poor  Good   (Blank rows = not tested)  CERVICAL SPECIAL TESTS:  Neck flexor muscle endurance test: NT  and Spurling's test: Negative    TODAY'S TREATMENT:                                                                                                                               DATE:   04/29/23: Cervical retraction 1 x 10  Seated Rows with black band 1 x 10  SCM Stretch 2 x 30 sec    PATIENT EDUCATION:  Education details: form and technique for correct performance of exercise  Person educated: Patient Education method: Programmer, multimedia, Demonstration, Verbal cues, and Handouts Education comprehension: verbalized understanding, returned demonstration, and verbal cues required  HOME EXERCISE PROGRAM: Access Code: [email or web address] URL: [email or web address] Date: 04/29/2023 Prepared by: Ellin Goodie  Exercises - Seated Cervical Retraction  - 3-4 x weekly - 3 sets - 10 reps - 2 sec  hold - Sternocleidomastoid Stretch  - 1 x daily - 3 reps - 30 sec  hold - Seated Shoulder Row with Anchored Resistance  - 3-4 x weekly - 3 sets - 10 reps  ASSESSMENT:  CLINICAL IMPRESSION: Patient is a 58 y.o. white woman who was seen today for physical therapy evaluation and treatment for neck pain and cervical radiculopathy. Pt shows deficits that include decreased cervical ROM and decreased shoulder strength along with cervical pain with movement. Cervical radiculopathy ruled out with negative Spurling's. Potential here for rotator cuff pathology with positive Neer's and positive painful arc. She will benefit from skilled PT to address these aforementioned deficits to return to sleeping and moving neck for daily activities like turning head to navigate to home environment.   OBJECTIVE IMPAIRMENTS: decreased ROM, hypomobility, impaired sensation, postural dysfunction, and pain.   ACTIVITY LIMITATIONS: carrying, lifting, sleeping, and reach over head  PARTICIPATION LIMITATIONS: cleaning, laundry, shopping, community activity, and yard work  PERSONAL FACTORS: Fitness, Time since onset of injury/illness/exacerbation, and 3+ comorbidities: Graves disease, HTN, Sleep Apnea   are also affecting patient's functional outcome.   REHAB POTENTIAL:  Good  CLINICAL DECISION MAKING: Stable/uncomplicated  EVALUATION COMPLEXITY: Low   GOALS: Goals reviewed with patient? No  SHORT TERM GOALS: Target date: 05/14/2023  Pt will be independent with HEP in order to improve strength and balance in order to decrease fall risk and improve function at home and work. Baseline: NT  Goal status: INITIAL    LONG TERM GOALS: Target date: 07/09/2023  Patient will have improved function and activity level as evidenced by an increase in FOTO score to target score.  Baseline: 58/100 with target of 62  Goal status: INITIAL  2. Patient will improve right cervical lateral flexion AROM to be symmetrical left cervical flexion for improved cervical function and ability to better navigate environment while walking.  Baseline: Lateral Flex R/L 35 deg/45 deg  Goal status: INITIAL  3.  Patient will improve shoulder and periscapular strength by at least 1/3 grade MMT (4- to 4) for improved cervical function and ability to maintain improved posture to relieve subacromial impingement symptoms.  Baseline:  Goal status: INITIAL    PLAN:  PT FREQUENCY: 1-2x/week  PT DURATION: 10 weeks  PLANNED INTERVENTIONS: Therapeutic exercises, Neuromuscular re-education, Patient/Family education, Joint mobilization, Joint manipulation, Aquatic Therapy, Dry Needling, Electrical stimulation, Spinal manipulation, Spinal mobilization, Cryotherapy, Moist heat, Manual therapy, and Re-evaluation  PLAN FOR NEXT SESSION: Assess cervical joint mobility, ULTT and cervical distraction, neck flexor endurance test?, Periscapular MMT in forward lean position.    Ellin Goodie PT, DPT  [facility] [address], [place], [address] Phone: [phone]   Fax:  [phone]

## 2023-05-02 ENCOUNTER — Encounter: Payer: Self-pay | Admitting: Family Medicine

## 2023-05-02 DIAGNOSIS — Z720 Tobacco use: Secondary | ICD-10-CM | POA: Insufficient documentation

## 2023-05-02 NOTE — Assessment & Plan Note (Signed)
LDL not at goal<100 The 10-year ASCVD risk score (Arnett DK, et al., 2019) is: 10.6%  Continue Zocor 20 mg daily

## 2023-05-02 NOTE — Assessment & Plan Note (Signed)
Chronic.  Well-controlled on current medication. Continue Amlodipine 5 mg daily Continue Hyzaar 100-12.5 mg daily Monitor blood pressure at home, goal less than 140/90 ECHO essentially normal.  If becomes symptomatic can repeat in future

## 2023-05-02 NOTE — Assessment & Plan Note (Signed)
Chronic. Denies SI/HI.   Continue Paxil 20 mg daily Encouraged CBT Mental health resources provided

## 2023-05-02 NOTE — Assessment & Plan Note (Signed)
Chronic Encouraged smoking cessation

## 2023-05-02 NOTE — Assessment & Plan Note (Signed)
Chronic Notable for lower C spine degenerative change, greater  C5-6 and C6-7.  No soft tissue edema or fracture noted. Discontinue Baclofen Heat/Ice as needed Tylenol 650 mg TID as needed for discomfort Diclofenac gel 4 times daily Referral PT

## 2023-05-05 ENCOUNTER — Ambulatory Visit

## 2023-05-05 DIAGNOSIS — M542 Cervicalgia: Secondary | ICD-10-CM

## 2023-05-05 NOTE — Therapy (Addendum)
OUTPATIENT PHYSICAL THERAPY CERVICAL TREATMENT   Patient Name: Emma Barker MRN: [id number] DOB:08-Dec-1965, 57 y.o., female Today's Date: 05/05/2023  END OF SESSION:  PT End of Session - 05/05/23 1514     Visit Number 2    Number of Visits 20    Date for PT Re-Evaluation 07/08/23    Authorization Type [employer]    Authorization - Number of Visits 20    Progress Note Due on Visit 10    PT Start Time 1516    PT Stop Time 1558    PT Time Calculation (min) 42 min    Activity Tolerance Patient tolerated treatment well    Behavior During Therapy [facility] for tasks assessed/performed             Past Medical History:  Diagnosis Date   Acute ethmoidal sinusitis    Anxiety    agoraphobia   Arthritis    osteo - everywhere   BRCA negative 12/2021   MyRisk neg; IBIS=6.0%/riskscore=2.8%   Chest pain    related to thyroid issues   Dental infection 12/11/2014   Facial cellulitis    Family history of pancreatic cancer 12/2021   My Risk neg   GERD (gastroesophageal reflux disease)    Graves disease    Graves disease    Graves' disease    Graves' disease    Headache    sinus   Hereditary pancreatitis    Hypertension    Hypokalemia    Hypothyroid    Leukocytosis 12/11/2014   Lichen sclerosus of female genitalia    Mitral valve regurgitation    PONV (postoperative nausea and vomiting)    SIRS (systemic inflammatory response syndrome) (HCC) 12/11/2014   Sleep apnea    supposed to use CPAP - no machine   Squamous cell carcinoma of skin 09/21/2019   left forehead above mid brow [facility])   Stress incontinence    Tricuspid valve regurgitation    Von Willebrand's disease (HCC)    Wears contact lenses    Past Surgical History:  Procedure Laterality Date   ABDOMINAL HYSTERECTOMY     APPENDECTOMY     BREAST EXCISIONAL BIOPSY Right    2014   CATARACT EXTRACTION W/PHACO Right 03/09/2018   Procedure: CATARACT EXTRACTION PHACO AND INTRAOCULAR LENS PLACEMENT (IOC) RIGHT IVA  TOPICAL SYMFONY LENS;  Surgeon: Nevada Crane, MD;  Location: [facility];  Service: Ophthalmology;  Laterality: Right;   CATARACT EXTRACTION W/PHACO Left 03/30/2018   Procedure: CATARACT EXTRACTION PHACO AND INTRAOCULAR LENS PLACEMENT (IOC) SYMFONY LENS LEFT;  Surgeon: Nevada Crane, MD;  Location: [facility];  Service: Ophthalmology;  Laterality: Left;   CHOLECYSTECTOMY     IMAGE GUIDED SINUS SURGERY Right 05/16/2015   Procedure: IMAGE GUIDED SINUS SURGERY, RIGHT ENDOSCOPIC MAXILLARY ANTROSTOMY, RIGHT ENDOSCOPIC ANTERIOR ETHMOIDECTOMY AND RIGHT ENDOSCOPIC FRONTAL RECESS EXPLORATION;  Surgeon: Geanie Logan, MD;  Location: [facility];  Service: ENT;  Laterality: Right;  GAVE DISK TO CE CE   POLYPECTOMY     Patient Active Problem List   Diagnosis Date Noted   Tobacco use 05/02/2023   Mood disorder (HCC) 02/02/2023   Need for hepatitis C screening test 02/02/2023   Encounter for screening for HIV 02/02/2023   Colon cancer screening 02/02/2023   Breast cancer screening by mammogram 02/02/2023   Class 1 obesity 02/02/2023   Chronic neck pain 02/02/2023   Family history of pancreatic cancer 12/19/2021   Mixed stress and urge urinary incontinence 07/31/2018   Lichen  sclerosus 07/01/2017   Anxiety and depression 10/11/2015   Gastro-esophageal reflux disease without esophagitis 07/12/2015   Acquired hypothyroidism 06/14/2015   Essential (primary) hypertension 06/14/2015   Female climacteric state 06/14/2015   Hyperlipidemia 06/14/2015    PCP: Dr. [name]    REFERRING PROVIDER: Dr. [name]   REFERRING DIAG: M54.2 (ICD-10-CM) - Neck pain  THERAPY DIAG:  Cervicalgia  Rationale for Evaluation and Treatment: Rehabilitation  ONSET DATE: 08/26/2018   SUBJECTIVE:                                                                                                                                                                                                          SUBJECTIVE STATEMENT:   Pt reports 8/10 NPS on the RUE 2/2 N/T. She also reports being HEP compliant.   Hand dominance: Right  PERTINENT HISTORY:  Pt reports that she started to experience worsening neck pain over the past several years that starts in her cervical spine and radiates down bilateral shoulders. She also notes numbness, tingling, and pain that radiates down bilateral UE into thumbs and dorsal wrist. She describes needing to keep arms by her side to avoid exacerbating her pain. She reports having a difficulty time opening jars and difficulty laying flat on her back.   PAIN:  Are you having pain? Yes: NPRS scale: 5/10 Pain location: Cervical spine and poster Pain description: Achy and sharp  Aggravating factors: Shoulder abduction and shoulders being exposed to cold air  Relieving factors: Side Sleeping, volatren gel    PRECAUTIONS: None  RED FLAGS: None     WEIGHT BEARING RESTRICTIONS: No  FALLS:  Has patient fallen in last 6 months? 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   NEXT MD VISIT: Scheduled to come back in a year   OBJECTIVE:   VITALS: BP 134/93  Sp02 98% HR 72   DIAGNOSTIC FINDINGS:  CLINICAL DATA:  Chronic neck pain   EXAM: CERVICAL SPINE - COMPLETE 4+ VIEW   COMPARISON:  None Available.   FINDINGS: Frontal, bilateral oblique, and lateral views of the cervical spine are obtained. There is straightening of the cervical spine from the C2 through C6 levels, with exaggerated kyphosis at the cervicothoracic junction. This is likely degenerative in nature. There are no acute displaced fractures.  Moderate spondylosis at C5-6 and C6-7. Mild facet hypertrophy at  the cervicothoracic junction. Prevertebral soft tissues are unremarkable. Evaluation of the neural foramina is limited by suboptimal positioning.   IMPRESSION: 1. Lower cervical degenerative changes, greatest at C5-6 and C6-7. 2. No acute fracture.     Electronically Signed   By: Sharlet Salina M.D.   On: 04/07/2023 06:57    PATIENT SURVEYS:  FOTO 58/100 with target 62   COGNITION: Overall cognitive status: Within functional limits for tasks assessed  SENSATION: Light touch: Numbness and tingling on palmer side of right hand   POSTURE: No Significant postural limitations  PALPATION: C7 TTP    CERVICAL ROM:   Active ROM A/PROM (deg) eval  Flexion 45*  Extension 45  Right lateral flexion 35  Left lateral flexion 45  Right rotation 45  Left rotation 60   (Blank rows = not tested)  UPPER EXTREMITY ROM:  Active ROM Right eval Left eval  Shoulder flexion 160 160  Shoulder extension    Shoulder abduction    Shoulder adduction    Shoulder extension    Shoulder internal rotation    Shoulder external rotation    Elbow flexion    Elbow extension    Wrist flexion    Wrist extension    Wrist ulnar deviation    Wrist radial deviation    Wrist pronation    Wrist supination     (Blank rows = not tested)  UPPER EXTREMITY MMT:  MMT Right eval Left eval  Shoulder flexion 4- 4-  Shoulder extension    Shoulder abduction 4 4  Shoulder adduction 4- 4-  Shoulder extension    Shoulder internal rotation    Shoulder external rotation    Middle trapezius NT  NT   Lower trapezius NT  NT   Elbow flexion    Elbow extension    Wrist flexion    Wrist extension    Wrist ulnar deviation    Wrist radial deviation    Wrist pronation    Wrist supination    Grip strength Poor  Good   (Blank rows = not tested)  CERVICAL SPECIAL TESTS:  Neck flexor muscle endurance test: NT  and Spurling's test: Negative    TODAY'S TREATMENT:                                                                                                                               DATE: 05/05/23   RUE ULTT:   Median: Negative (pt notes muscular tightness felt throughout movement)   Radial: Inconclusive (pt notes N/T sx but does not achieve relief w/ movement of distal segment)   Ulnar: Inconclusive (pt notes N/T sx but does not achieve relief w/ movement of distal segment)   PAM's of C4-T12 hypomobility noted, non- concordant sharp severe pain at T7-T8 spinous process, concordant pain at C5-C6 vertebrae, TTP noted around bilat R parascapular muscles> L parascapular muscles.  UQNS: Decreased sensation on R side for C2, C3, C6, C8 dermatomal patterns.   Cervical distraction: Positive for mild temporary relief of N/T sx. 3 x30sec   DNF Endurance Testing: 24.01 sec   There- ex:  Seated cervical retraction 1 x 10  Seated Rows with black band 2 x 10  Upper Trap Stretch w/ OP 2x30 sec Standing bilat shoulder extension w/ black band 2 x10   PATIENT EDUCATION:  Education details: form and technique for correct performance of exercise  Person educated: Patient Education method: Explanation, Demonstration, Verbal cues, and Handouts Education comprehension: verbalized understanding, returned demonstration, and verbal cues required  HOME EXERCISE PROGRAM: Access Code: [email or web address] URL: [email or web address] Date: 04/29/2023 Prepared by: Ellin Goodie  Exercises - Seated Cervical Retraction  - 3-4 x weekly - 3 sets - 10 reps - 2 sec  hold - Sternocleidomastoid Stretch  - 1 x daily - 3 reps - 30 sec  hold - Seated Shoulder Row with Anchored Resistance  - 3-4 x weekly - 3 sets - 10 reps  ASSESSMENT:  CLINICAL IMPRESSION:  Session focused on further testing for radicular sx noted by pt, along with reviewing pt's HEP. Pt notes increased TTP at bilateral parspinals R>L side, concordant pain at C5-C6 vertebrae according to PAM's and non concordant sharp pain of the T7-T8  spinous processes. Upon UQNS, pt notes decreased sensation on the R side C2, C3, C6, C8 dermatomal patterns more significant at C6 and C8. Pt however does have mild temporary pain relief with cervical distraction noted at today's visit and pt may benefit from the cervical traction machine at future visits. RUE ULTT for radial and ulnar nerve were deemed inconclusive as no change in N/T sx noted or alleviated throughout the test. DNF testing was also performed today, pt able to maintain positioning for 24.01sec, however this is less than the average for women in her age category of 29 sec noting weakness in DNF musculature. Pt continues to note significant RUE> LUE N/T down into her fingertips that has been progressively worsening and would continue to benefit from skilled PT interventions to address remaining strength, pain and radicular related deficits to return to PLOF and improve QoL.   OBJECTIVE IMPAIRMENTS: decreased ROM, hypomobility, impaired sensation, postural dysfunction, and pain.   ACTIVITY LIMITATIONS: carrying, lifting, sleeping, and reach over head  PARTICIPATION LIMITATIONS: cleaning, laundry, shopping, community activity, and yard work  PERSONAL FACTORS: Fitness, Time since onset of injury/illness/exacerbation, and 3+ comorbidities: Graves disease, HTN, Sleep Apnea   are also affecting patient's functional outcome.   REHAB POTENTIAL: Good  CLINICAL DECISION MAKING: Stable/uncomplicated  EVALUATION COMPLEXITY: Low   GOALS: Goals reviewed with patient? No  SHORT TERM GOALS: Target date: 05/14/2023  Pt will be independent with HEP in order to improve strength and balance in order to decrease fall risk and improve function at home and work. Baseline: NT  Goal status: INITIAL    LONG TERM GOALS: Target date: 07/09/2023  Patient will have improved function and activity level as evidenced by an increase in FOTO score to target score.  Baseline: 58/100 with target of 62  Goal  status: INITIAL  2. Patient will improve right cervical lateral flexion AROM to be symmetrical left cervical flexion for improved cervical function and ability to better navigate environment while walking.  Baseline: Lateral Flex R/L 35 deg/45 deg  Goal status: INITIAL  3.  Patient will improve shoulder and periscapular strength by at least 1/3 grade MMT (4- to 4) for improved  cervical function and ability to maintain improved posture to relieve subacromial impingement symptoms.  Baseline:  Goal status: INITIAL    PLAN:  PT FREQUENCY: 1-2x/week  PT DURATION: 10 weeks  PLANNED INTERVENTIONS: Therapeutic exercises, Neuromuscular re-education, Patient/Family education, Joint mobilization, Joint manipulation, Aquatic Therapy, Dry Needling, Electrical stimulation, Spinal manipulation, Spinal mobilization, Cryotherapy, Moist heat, Manual therapy, and Re-evaluation  PLAN FOR NEXT SESSION: Periscapular MMT in forward lean position, Cervical Traction machine.    Lovie Macadamia, SPT   Nolon Bussing, PT, DPT Physical Therapist - [facility]  05/05/23, 5:19 PM

## 2023-05-07 ENCOUNTER — Ambulatory Visit: Admitting: Physical Therapy

## 2023-05-07 DIAGNOSIS — M542 Cervicalgia: Secondary | ICD-10-CM | POA: Diagnosis not present

## 2023-05-07 NOTE — Therapy (Signed)
OUTPATIENT PHYSICAL THERAPY CERVICAL TREATMENT   Patient Name: Emma Barker MRN: [id number] DOB:01-05-1966, 57 y.o., female Today's Date: 05/07/2023  END OF SESSION:    Past Medical History:  Diagnosis Date   Acute ethmoidal sinusitis    Anxiety    agoraphobia   Arthritis    osteo - everywhere   BRCA negative 12/2021   MyRisk neg; IBIS=6.0%/riskscore=2.8%   Chest pain    related to thyroid issues   Dental infection 12/11/2014   Facial cellulitis    Family history of pancreatic cancer 12/2021   My Risk neg   GERD (gastroesophageal reflux disease)    Graves disease    Graves disease    Graves' disease    Graves' disease    Headache    sinus   Hereditary pancreatitis    Hypertension    Hypokalemia    Hypothyroid    Leukocytosis 12/11/2014   Lichen sclerosus of female genitalia    Mitral valve regurgitation    PONV (postoperative nausea and vomiting)    SIRS (systemic inflammatory response syndrome) (HCC) 12/11/2014   Sleep apnea    supposed to use CPAP - no machine   Squamous cell carcinoma of skin 09/21/2019   left forehead above mid brow [facility])   Stress incontinence    Tricuspid valve regurgitation    Von Willebrand's disease (HCC)    Wears contact lenses    Past Surgical History:  Procedure Laterality Date   ABDOMINAL HYSTERECTOMY     APPENDECTOMY     BREAST EXCISIONAL BIOPSY Right    2014   CATARACT EXTRACTION W/PHACO Right 03/09/2018   Procedure: CATARACT EXTRACTION PHACO AND INTRAOCULAR LENS PLACEMENT (IOC) RIGHT IVA TOPICAL SYMFONY LENS;  Surgeon: Nevada Crane, MD;  Location: [facility];  Service: Ophthalmology;  Laterality: Right;   CATARACT EXTRACTION W/PHACO Left 03/30/2018   Procedure: CATARACT EXTRACTION PHACO AND INTRAOCULAR LENS PLACEMENT (IOC) SYMFONY LENS LEFT;  Surgeon: Nevada Crane, MD;  Location: [facility];  Service: Ophthalmology;  Laterality: Left;   CHOLECYSTECTOMY     IMAGE GUIDED SINUS SURGERY  Right 05/16/2015   Procedure: IMAGE GUIDED SINUS SURGERY, RIGHT ENDOSCOPIC MAXILLARY ANTROSTOMY, RIGHT ENDOSCOPIC ANTERIOR ETHMOIDECTOMY AND RIGHT ENDOSCOPIC FRONTAL RECESS EXPLORATION;  Surgeon: Geanie Logan, MD;  Location: [facility];  Service: ENT;  Laterality: Right;  GAVE DISK TO CE CE   POLYPECTOMY     Patient Active Problem List   Diagnosis Date Noted   Tobacco use 05/02/2023   Mood disorder (HCC) 02/02/2023   Need for hepatitis C screening test 02/02/2023   Encounter for screening for HIV 02/02/2023   Colon cancer screening 02/02/2023   Breast cancer screening by mammogram 02/02/2023   Class 1 obesity 02/02/2023   Chronic neck pain 02/02/2023   Family history of pancreatic cancer 12/19/2021   Mixed stress and urge urinary incontinence 07/31/2018   Lichen sclerosus 07/01/2017   Anxiety and depression 10/11/2015   Gastro-esophageal reflux disease without esophagitis 07/12/2015   Acquired hypothyroidism 06/14/2015   Essential (primary) hypertension 06/14/2015   Female climacteric state 06/14/2015   Hyperlipidemia 06/14/2015    PCP: Dr. [name]    REFERRING PROVIDER: Dr. [name]   REFERRING DIAG: M54.2 (ICD-10-CM) - Neck pain  THERAPY DIAG:  No diagnosis found.  Rationale for Evaluation and Treatment: Rehabilitation  ONSET DATE: 08/26/2018   SUBJECTIVE:  SUBJECTIVE STATEMENT:   Pt reports 8/10 NPS on the RUE 2/2 N/T. She also reports being HEP compliant.   Hand dominance: Right  PERTINENT HISTORY:  Pt reports that she started to experience worsening neck pain over the past several years that starts in her cervical spine and radiates down bilateral shoulders. She also notes numbness, tingling, and pain that radiates down bilateral UE into thumbs and dorsal  wrist. She describes needing to keep arms by her side to avoid exacerbating her pain. She reports having a difficulty time opening jars and difficulty laying flat on her back.   PAIN:  Are you having pain? Yes: NPRS scale: 5/10 Pain location: Cervical spine and poster Pain description: Achy and sharp  Aggravating factors: Shoulder abduction and shoulders being exposed to cold air  Relieving factors: Side Sleeping, volatren gel    PRECAUTIONS: None  RED FLAGS: None     WEIGHT BEARING RESTRICTIONS: No  FALLS:  Has patient fallen in last 6 months? 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   NEXT MD VISIT: Scheduled to come back in a year   OBJECTIVE:   VITALS: BP 134/93  Sp02 98% HR 72   DIAGNOSTIC FINDINGS:  CLINICAL DATA:  Chronic neck pain   EXAM: CERVICAL SPINE - COMPLETE 4+ VIEW   COMPARISON:  None Available.   FINDINGS: Frontal, bilateral oblique, and lateral views of the cervical spine are obtained. There is straightening of the cervical spine from the C2 through C6 levels, with exaggerated kyphosis at the cervicothoracic junction. This is likely degenerative in nature. There are no acute displaced fractures.   Moderate spondylosis at C5-6 and C6-7. Mild facet hypertrophy at the cervicothoracic junction. Prevertebral soft tissues are unremarkable. Evaluation of the neural foramina is limited by suboptimal positioning.   IMPRESSION: 1. Lower cervical degenerative changes, greatest at C5-6 and C6-7. 2. No acute fracture.     Electronically Signed   By: Sharlet Salina M.D.   On: 04/07/2023 06:57    PATIENT SURVEYS:  FOTO 58/100 with target 62   COGNITION: Overall cognitive status:  Within functional limits for tasks assessed  SENSATION: Light touch: Numbness and tingling on palmer side of right hand   POSTURE: No Significant postural limitations  PALPATION: C7 TTP    CERVICAL ROM:   Active ROM A/PROM (deg) eval  Flexion 45*  Extension 45  Right lateral flexion 35  Left lateral flexion 45  Right rotation 45  Left rotation 60   (Blank rows = not tested)  UPPER EXTREMITY ROM:  Active ROM Right eval Left eval  Shoulder flexion 160 160  Shoulder extension    Shoulder abduction    Shoulder adduction    Shoulder extension    Shoulder internal rotation    Shoulder external rotation    Elbow flexion    Elbow extension    Wrist flexion    Wrist extension    Wrist ulnar deviation    Wrist radial deviation    Wrist pronation    Wrist supination     (Blank rows = not tested)  UPPER EXTREMITY MMT:  MMT Right eval Left eval  Shoulder flexion 4- 4-  Shoulder extension    Shoulder abduction 4 4  Shoulder  adduction 4- 4-  Shoulder extension    Shoulder internal rotation    Shoulder external rotation    Middle trapezius NT  NT   Lower trapezius NT  NT   Elbow flexion    Elbow extension    Wrist flexion    Wrist extension    Wrist ulnar deviation    Wrist radial deviation    Wrist pronation    Wrist supination    Grip strength Poor  Good   (Blank rows = not tested)  CERVICAL SPECIAL TESTS:  Neck flexor muscle endurance test: NT  and Spurling's test: Negative    TODAY'S TREATMENT:                                                                                                                              DATE: 05/07/23  THEREX  UBE with seat at 10 with resistance at 3- 2.5 forward and 2.5 backward  Shoulder Horizontal Abduction at 90 deg 3 x 10  OMEGA Lat Pull down with #20 1 x 10  OMEGA Lat Pull down with #25 2 x 10  MANUAL  CPA C2-C7 Mobs grade III x 20  Upper trap and cervical paraspinal massage and trigger point release    MODALITY   Performed by: Ellin Goodie PT, DPT Trigger Point Dry-Needling  Treatment instructions: Expect mild to moderate muscle soreness. S/S of pneumothorax if dry needled over a lung field, and to seek immediate medical attention should they occur. Patient verbalized understanding of these instructions and education.  Patient Consent Given: Yes Muscles treated: R suboccipital  Electrical stimulation performed: No Parameters: N/A Treatment response/outcome: Decreased tension in right suboccipital     05/05/23:  THEREX:   RUE ULTT:   Median: Negative (pt notes muscular tightness felt throughout movement)   Radial: Inconclusive (pt notes N/T sx but does not achieve relief w/ movement of distal segment)   Ulnar: Inconclusive (pt notes N/T sx but does not achieve relief w/ movement of distal segment)   PAM's of C4-T12 hypomobility noted, non- concordant sharp severe pain at T7-T8 spinous process, concordant pain at C5-C6 vertebrae, TTP noted around bilat R parascapular muscles> L parascapular muscles.   UQNS: Decreased sensation on R side for C2, C3, C6, C8 dermatomal patterns.   Cervical distraction: Positive for mild temporary relief of N/T sx. 3 x30sec   DNF Endurance Testing: 24.01 sec   There- ex:  Seated cervical retraction 1 x 10  Seated Rows with black band 2 x 10  Upper Trap Stretch w/ OP 2x30 sec Standing bilat shoulder extension w/ black band 2 x10   PATIENT EDUCATION:  Education details: form and technique for correct performance of exercise  Person educated: Patient Education method: Explanation, Demonstration, Verbal cues, and Handouts Education comprehension: verbalized understanding, returned demonstration, and verbal cues required  HOME EXERCISE PROGRAM: Access Code: [email or web address] URL: [email or web address] Date: 05/07/2023 Prepared by: Ellin Goodie  Exercises - Seated Cervical Retraction  -  3-4 x weekly - 3 sets - 10 reps - 2 sec   hold - Sternocleidomastoid Stretch  - 1 x daily - 3 reps - 30 sec  hold - Seated Shoulder Row with Anchored Resistance  - 3-4 x weekly - 3 sets - 10 reps - Shoulder Horizontal Abduction - Thumbs Up  - 3-4 x weekly - 3 sets - 10 reps  ASSESSMENT:  CLINICAL IMPRESSION: Pt responded well to dry needling with relief in cervical tension. She was able to complete all periscapular strengthening exercises without an increase in her cervical pain. She continues to have increased right shoulder pain with radiating symptoms from top of AC down to 5th digit of right hand. PT to screen shoulder for pathology next visit. She will continue to benefit from skilled PT interventions to address remaining strength, pain and radicular related deficits to return to PLOF and improve QoL.    OBJECTIVE IMPAIRMENTS: decreased ROM, hypomobility, impaired sensation, postural dysfunction, and pain.   ACTIVITY LIMITATIONS: carrying, lifting, sleeping, and reach over head  PARTICIPATION LIMITATIONS: cleaning, laundry, shopping, community activity, and yard work  PERSONAL FACTORS: Fitness, Time since onset of injury/illness/exacerbation, and 3+ comorbidities: Graves disease, HTN, Sleep Apnea   are also affecting patient's functional outcome.   REHAB POTENTIAL: Good  CLINICAL DECISION MAKING: Stable/uncomplicated  EVALUATION COMPLEXITY: Low   GOALS: Goals reviewed with patient? No  SHORT TERM GOALS: Target date: 05/14/2023  Pt will be independent with HEP in order to improve strength and balance in order to decrease fall risk and improve function at home and work. Baseline: NT 05/07/23 Performing independently  Goal status: ACHIEVED     LONG TERM GOALS: Target date: 07/09/2023  Patient will have improved function and activity level as evidenced by an increase in FOTO score to target score.  Baseline: 58/100 with target of 62  Goal status: ONGOING   2. Patient will improve right cervical lateral flexion AROM  to be symmetrical left cervical flexion for improved cervical function and ability to better navigate environment while walking.  Baseline: Lateral Flex R/L 35 deg/45 deg  Goal status: ONGOING   3.  Patient will improve shoulder and periscapular strength by at least 1/3 grade MMT (4- to 4) for improved cervical function and ability to maintain improved posture to relieve subacromial impingement symptoms.  Baseline: NT Goal status: ONGOING     PLAN:  PT FREQUENCY: 1-2x/week  PT DURATION: 10 weeks  PLANNED INTERVENTIONS: Therapeutic exercises, Neuromuscular re-education, Patient/Family education, Joint mobilization, Joint manipulation, Aquatic Therapy, Dry Needling, Electrical stimulation, Spinal manipulation, Spinal mobilization, Cryotherapy, Moist heat, Manual therapy, and Re-evaluation  PLAN FOR NEXT SESSION:  Dry needling of rectus capitus. Periscapular MMT in forward lean position. RTC testing for the right and left shoulder to screen for pathology    Ellin Goodie PT, DPT  [facility] [address] [place], [address] Phone: [phone]   Fax:  [phone]

## 2023-05-12 ENCOUNTER — Encounter: Payer: Self-pay | Admitting: Physical Therapy

## 2023-05-12 ENCOUNTER — Ambulatory Visit: Admitting: Physical Therapy

## 2023-05-12 DIAGNOSIS — M542 Cervicalgia: Secondary | ICD-10-CM

## 2023-05-12 NOTE — Therapy (Signed)
OUTPATIENT PHYSICAL THERAPY CERVICAL TREATMENT   Patient Name: Emma Barker MRN: [id number] DOB:06-19-1966, 57 y.o., female Today's Date: 05/12/2023  END OF SESSION:  PT End of Session - 05/12/23 1523     Visit Number 4    Number of Visits 20    Date for PT Re-Evaluation 07/08/23    Authorization Type [employer]    Authorization - Visit Number 4    Authorization - Number of Visits 20    Progress Note Due on Visit 10    PT Start Time 1520    PT Stop Time 1600    PT Time Calculation (min) 40 min    Activity Tolerance Patient tolerated treatment well    Behavior During Therapy [facility] for tasks assessed/performed              Past Medical History:  Diagnosis Date   Acute ethmoidal sinusitis    Anxiety    agoraphobia   Arthritis    osteo - everywhere   BRCA negative 12/2021   MyRisk neg; IBIS=6.0%/riskscore=2.8%   Chest pain    related to thyroid issues   Dental infection 12/11/2014   Facial cellulitis    Family history of pancreatic cancer 12/2021   My Risk neg   GERD (gastroesophageal reflux disease)    Graves disease    Graves disease    Graves' disease    Graves' disease    Headache    sinus   Hereditary pancreatitis    Hypertension    Hypokalemia    Hypothyroid    Leukocytosis 12/11/2014   Lichen sclerosus of female genitalia    Mitral valve regurgitation    PONV (postoperative nausea and vomiting)    SIRS (systemic inflammatory response syndrome) (HCC) 12/11/2014   Sleep apnea    supposed to use CPAP - no machine   Squamous cell carcinoma of skin 09/21/2019   left forehead above mid brow [facility])   Stress incontinence    Tricuspid valve regurgitation    Von Willebrand's disease (HCC)    Wears contact lenses    Past Surgical History:  Procedure Laterality Date   ABDOMINAL HYSTERECTOMY     APPENDECTOMY     BREAST EXCISIONAL BIOPSY Right    2014   CATARACT EXTRACTION W/PHACO Right 03/09/2018   Procedure: CATARACT EXTRACTION PHACO AND  INTRAOCULAR LENS PLACEMENT (IOC) RIGHT IVA TOPICAL SYMFONY LENS;  Surgeon: Nevada Crane, MD;  Location: [facility];  Service: Ophthalmology;  Laterality: Right;   CATARACT EXTRACTION W/PHACO Left 03/30/2018   Procedure: CATARACT EXTRACTION PHACO AND INTRAOCULAR LENS PLACEMENT (IOC) SYMFONY LENS LEFT;  Surgeon: Nevada Crane, MD;  Location: [facility];  Service: Ophthalmology;  Laterality: Left;   CHOLECYSTECTOMY     IMAGE GUIDED SINUS SURGERY Right 05/16/2015   Procedure: IMAGE GUIDED SINUS SURGERY, RIGHT ENDOSCOPIC MAXILLARY ANTROSTOMY, RIGHT ENDOSCOPIC ANTERIOR ETHMOIDECTOMY AND RIGHT ENDOSCOPIC FRONTAL RECESS EXPLORATION;  Surgeon: Geanie Logan, MD;  Location: [facility];  Service: ENT;  Laterality: Right;  GAVE DISK TO CE CE   POLYPECTOMY     Patient Active Problem List   Diagnosis Date Noted   Tobacco use 05/02/2023   Mood disorder (HCC) 02/02/2023   Need for hepatitis C screening test 02/02/2023   Encounter for screening for HIV 02/02/2023   Colon cancer screening 02/02/2023   Breast cancer screening by mammogram 02/02/2023   Class 1 obesity 02/02/2023   Chronic neck pain 02/02/2023   Family history of pancreatic cancer 12/19/2021   Mixed  stress and urge urinary incontinence 07/31/2018   Lichen sclerosus 07/01/2017   Anxiety and depression 10/11/2015   Gastro-esophageal reflux disease without esophagitis 07/12/2015   Acquired hypothyroidism 06/14/2015   Essential (primary) hypertension 06/14/2015   Female climacteric state 06/14/2015   Hyperlipidemia 06/14/2015    PCP: Dr. [name]    REFERRING PROVIDER: Dr. [name]   REFERRING DIAG: M54.2 (ICD-10-CM) - Neck pain  THERAPY DIAG:  Cervicalgia  Rationale for Evaluation and Treatment: Rehabilitation  ONSET DATE: 08/26/2018   SUBJECTIVE:                                                                                                                                                                                                          SUBJECTIVE STATEMENT:   Pt reports that she has not had a significant increase in her right shoulder. She has a busy weekend and she was unable to do exercises.   Hand dominance: Right  PERTINENT HISTORY:  Pt reports that she started to experience worsening neck pain over the past several years that starts in her cervical spine and radiates down bilateral shoulders. She also notes numbness, tingling, and pain that radiates down bilateral UE into thumbs and dorsal wrist. She describes needing to keep arms by her side to avoid exacerbating her pain. She reports having a difficulty time opening jars and difficulty laying flat on her back.   PAIN:  Are you having pain? Yes: NPRS scale: 5/10 Pain location: Cervical spine and poster Pain description: Achy and sharp  Aggravating factors: Shoulder abduction and shoulders being exposed to cold air  Relieving factors: Side Sleeping, volatren gel    PRECAUTIONS: None  RED FLAGS: None     WEIGHT BEARING RESTRICTIONS: No  FALLS:  Has patient fallen in last 6 months? 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   NEXT MD VISIT: Scheduled to come back in a year   OBJECTIVE:   VITALS: BP 134/93  Sp02 98% HR 72   DIAGNOSTIC FINDINGS:  CLINICAL DATA:  Chronic neck pain   EXAM: CERVICAL SPINE - COMPLETE 4+ VIEW   COMPARISON:  None Available.   FINDINGS: Frontal, bilateral oblique, and lateral views of the cervical spine are obtained. There is straightening of the cervical spine from the C2 through C6  levels, with exaggerated kyphosis at the cervicothoracic junction. This is likely degenerative in nature. There are no  acute displaced fractures.   Moderate spondylosis at C5-6 and C6-7. Mild facet hypertrophy at the cervicothoracic junction. Prevertebral soft tissues are unremarkable. Evaluation of the neural foramina is limited by suboptimal positioning.   IMPRESSION: 1. Lower cervical degenerative changes, greatest at C5-6 and C6-7. 2. No acute fracture.     Electronically Signed   By: Sharlet Salina M.D.   On: 04/07/2023 06:57    PATIENT SURVEYS:  FOTO 58/100 with target 62   COGNITION: Overall cognitive status: Within functional limits for tasks assessed  SENSATION: Light touch: Numbness and tingling on palmer side of right hand   POSTURE: No Significant postural limitations  PALPATION: C7 TTP    CERVICAL ROM:   Active ROM A/PROM (deg) eval  Flexion 45*  Extension 45  Right lateral flexion 35  Left lateral flexion 45  Right rotation 45  Left rotation 60   (Blank rows = not tested)  UPPER EXTREMITY ROM:  Active ROM Right eval Left eval  Shoulder flexion 160 160  Shoulder extension    Shoulder abduction    Shoulder adduction    Shoulder extension    Shoulder internal rotation    Shoulder external rotation    Elbow flexion    Elbow extension    Wrist flexion    Wrist extension    Wrist ulnar deviation    Wrist radial deviation    Wrist pronation    Wrist supination     (Blank rows = not tested)  UPPER EXTREMITY MMT:  MMT Right eval Left eval  Shoulder flexion 4- 4-  Shoulder extension    Shoulder abduction 4 4  Shoulder adduction 4- 4-  Shoulder extension    Shoulder internal rotation    Shoulder external rotation    Middle trapezius NT  NT   Lower trapezius NT  NT   Elbow flexion    Elbow extension    Wrist flexion    Wrist extension    Wrist ulnar deviation    Wrist radial deviation    Wrist pronation    Wrist supination    Grip strength Poor  Good   (Blank rows = not tested)  CERVICAL SPECIAL TESTS:  Neck flexor muscle endurance test: NT   and Spurling's test: Negative    TODAY'S TREATMENT:                                                                                                                              DATE:   05/12/23: THEREX  UBE with seat at 10 with resistance at 3- 2.5 forward and 2.5 backward  OMEGA Seated Rows #25 1 x 10  OMEGA Seated Rows #35 2 x 10  OMEGA Lat Pull Down #25 1 x 10  OMEGA Lat Pull Down #30 2 x 10  Eccentric Shoulder Flexion to 90 degrees with yellow band  2 x 10  Eccentric Shoulder Flexion to 160 degrees with wall climbs 1 x 5  -Pt reports increased right shoulder pain.   Speed's test: + RUE  Infraspinatus Test: Negative Bilaterally  Lift off: Negative Bilaterally   MANUAL   Myofascial release of upper trap and cervical paraspinals   05/07/23: THEREX  UBE with seat at 10 with resistance at 3- 2.5 forward and 2.5 backward  Shoulder Horizontal Abduction at 90 deg 3 x 10  OMEGA Lat Pull down with #20 1 x 10  OMEGA Lat Pull down with #25 2 x 10  MANUAL  CPA C2-C7 Mobs grade III x 20  Upper trap and cervical paraspinal massage and trigger point release   MODALITY   Performed by: Ellin Goodie PT, DPT Trigger Point Dry-Needling  Treatment instructions: Expect mild to moderate muscle soreness. S/S of pneumothorax if dry needled over a lung field, and to seek immediate medical attention should they occur. Patient verbalized understanding of these instructions and education.  Patient Consent Given: Yes Muscles treated: R suboccipital  Electrical stimulation performed: No Parameters: N/A Treatment response/outcome: Decreased tension in right suboccipital     05/05/23:  THEREX:   RUE ULTT:   Median: Negative (pt notes muscular tightness felt throughout movement)   Radial: Inconclusive (pt notes N/T sx but does not achieve relief w/ movement of distal segment)   Ulnar: Inconclusive (pt notes N/T sx but does not achieve relief w/ movement of distal segment)   PAM's of C4-T12  hypomobility noted, non- concordant sharp severe pain at T7-T8 spinous process, concordant pain at C5-C6 vertebrae, TTP noted around bilat R parascapular muscles> L parascapular muscles.   UQNS: Decreased sensation on R side for C2, C3, C6, C8 dermatomal patterns.   Cervical distraction: Positive for mild temporary relief of N/T sx. 3 x30sec   DNF Endurance Testing: 24.01 sec   There- ex:  Seated cervical retraction 1 x 10  Seated Rows with black band 2 x 10  Upper Trap Stretch w/ OP 2x30 sec Standing bilat shoulder extension w/ black band 2 x10   PATIENT EDUCATION:  Education details: form and technique for correct performance of exercise  Person educated: Patient Education method: Explanation, Demonstration, Verbal cues, and Handouts Education comprehension: verbalized understanding, returned demonstration, and verbal cues required  HOME EXERCISE PROGRAM: Access Code: [email or web address] URL: [email or web address] Date: 05/12/2023 Prepared by: Ellin Goodie  Exercises - Seated Cervical Retraction  - 3-4 x weekly - 3 sets - 10 reps - 2 sec  hold - Seated Cervical Sidebending Stretch  - 1 x daily - 3 reps - 30 sec  hold - Sternocleidomastoid Stretch  - 1 x daily - 3 reps - 30 sec  hold - Seated Shoulder Row with Anchored Resistance  - 3-4 x weekly - 3 sets - 10 reps - Shoulder Horizontal Abduction - Thumbs Up  - 3-4 x weekly - 3 sets - 10 reps - Standing Eccentric Shoulder Flexion at Wall  - 3-4 x weekly - 3 sets - 10 reps  ASSESSMENT:  CLINICAL IMPRESSION: Pt shows signs and symptoms of right biceps tendinopathy with increased pain over biceps tendon with resisted shoulder flexion and positive speed's test. Unable to rule in other rotator cuff tendon pathology. She was able to complete all exercises without an increase in right shoulder pain with the exception of eccentric biceps flexion past 90 degrees. Modified HEP accordingly to only include resisted shoulder flexion to  90 degree or within pt's pain free range  of motion. She will continue to benefit from skilled PT interventions to address remaining strength, pain and radicular related deficits to return to PLOF and improve QoL.    OBJECTIVE IMPAIRMENTS: decreased ROM, hypomobility, impaired sensation, postural dysfunction, and pain.   ACTIVITY LIMITATIONS: carrying, lifting, sleeping, and reach over head  PARTICIPATION LIMITATIONS: cleaning, laundry, shopping, community activity, and yard work  PERSONAL FACTORS: Fitness, Time since onset of injury/illness/exacerbation, and 3+ comorbidities: Graves disease, HTN, Sleep Apnea   are also affecting patient's functional outcome.   REHAB POTENTIAL: Good  CLINICAL DECISION MAKING: Stable/uncomplicated  EVALUATION COMPLEXITY: Low   GOALS: Goals reviewed with patient? No  SHORT TERM GOALS: Target date: 05/14/2023  Pt will be independent with HEP in order to improve strength and balance in order to decrease fall risk and improve function at home and work. Baseline: NT 05/07/23 Performing independently  Goal status: ACHIEVED     LONG TERM GOALS: Target date: 07/09/2023  Patient will have improved function and activity level as evidenced by an increase in FOTO score to target score.  Baseline: 58/100 with target of 62  Goal status: ONGOING   2. Patient will improve right cervical lateral flexion AROM to be symmetrical left cervical flexion for improved cervical function and ability to better navigate environment while walking.  Baseline: Lateral Flex R/L 35 deg/45 deg  Goal status: ONGOING   3.  Patient will improve shoulder and periscapular strength by at least 1/3 grade MMT (4- to 4) for improved cervical function and ability to maintain improved posture to relieve subacromial impingement symptoms.  Baseline: NT Goal status: ONGOING     PLAN:  PT FREQUENCY: 1-2x/week  PT DURATION: 10 weeks  PLANNED INTERVENTIONS: Therapeutic exercises,  Neuromuscular re-education, Patient/Family education, Joint mobilization, Joint manipulation, Aquatic Therapy, Dry Needling, Electrical stimulation, Spinal manipulation, Spinal mobilization, Cryotherapy, Moist heat, Manual therapy, and Re-evaluation  PLAN FOR NEXT SESSION:  FOTO. Dry needling of rectus capitus. Defer on MMT periscapular testing. Rhomboid stretch and low trap setting v's.    Ellin Goodie PT, DPT  [facility] [address], [place], [address] Phone: [phone]   Fax:  [phone]

## 2023-05-14 ENCOUNTER — Ambulatory Visit: Admitting: Physical Therapy

## 2023-05-14 DIAGNOSIS — M542 Cervicalgia: Secondary | ICD-10-CM | POA: Diagnosis not present

## 2023-05-14 NOTE — Therapy (Signed)
OUTPATIENT PHYSICAL THERAPY CERVICAL TREATMENT   Patient Name: Emma Barker MRN: [id number] DOB:03-16-1966, 57 y.o., female Today's Date: 05/14/2023  END OF SESSION:  PT End of Session - 05/14/23 1522     Visit Number 5    Number of Visits 20    Date for PT Re-Evaluation 07/08/23    Authorization Type [employer]    Authorization - Visit Number 5    Authorization - Number of Visits 20    Progress Note Due on Visit 10    PT Start Time 1518    PT Stop Time 1600    PT Time Calculation (min) 42 min    Activity Tolerance Patient tolerated treatment well    Behavior During Therapy [facility] for tasks assessed/performed              Past Medical History:  Diagnosis Date   Acute ethmoidal sinusitis    Anxiety    agoraphobia   Arthritis    osteo - everywhere   BRCA negative 12/2021   MyRisk neg; IBIS=6.0%/riskscore=2.8%   Chest pain    related to thyroid issues   Dental infection 12/11/2014   Facial cellulitis    Family history of pancreatic cancer 12/2021   My Risk neg   GERD (gastroesophageal reflux disease)    Graves disease    Graves disease    Graves' disease    Graves' disease    Headache    sinus   Hereditary pancreatitis    Hypertension    Hypokalemia    Hypothyroid    Leukocytosis 12/11/2014   Lichen sclerosus of female genitalia    Mitral valve regurgitation    PONV (postoperative nausea and vomiting)    SIRS (systemic inflammatory response syndrome) (HCC) 12/11/2014   Sleep apnea    supposed to use CPAP - no machine   Squamous cell carcinoma of skin 09/21/2019   left forehead above mid brow [facility])   Stress incontinence    Tricuspid valve regurgitation    Von Willebrand's disease (HCC)    Wears contact lenses    Past Surgical History:  Procedure Laterality Date   ABDOMINAL HYSTERECTOMY     APPENDECTOMY     BREAST EXCISIONAL BIOPSY Right    2014   CATARACT EXTRACTION W/PHACO Right 03/09/2018   Procedure: CATARACT EXTRACTION PHACO AND  INTRAOCULAR LENS PLACEMENT (IOC) RIGHT IVA TOPICAL SYMFONY LENS;  Surgeon: Nevada Crane, MD;  Location: [facility];  Service: Ophthalmology;  Laterality: Right;   CATARACT EXTRACTION W/PHACO Left 03/30/2018   Procedure: CATARACT EXTRACTION PHACO AND INTRAOCULAR LENS PLACEMENT (IOC) SYMFONY LENS LEFT;  Surgeon: Nevada Crane, MD;  Location: [facility];  Service: Ophthalmology;  Laterality: Left;   CHOLECYSTECTOMY     IMAGE GUIDED SINUS SURGERY Right 05/16/2015   Procedure: IMAGE GUIDED SINUS SURGERY, RIGHT ENDOSCOPIC MAXILLARY ANTROSTOMY, RIGHT ENDOSCOPIC ANTERIOR ETHMOIDECTOMY AND RIGHT ENDOSCOPIC FRONTAL RECESS EXPLORATION;  Surgeon: Geanie Logan, MD;  Location: [facility];  Service: ENT;  Laterality: Right;  GAVE DISK TO CE CE   POLYPECTOMY     Patient Active Problem List   Diagnosis Date Noted   Tobacco use 05/02/2023   Mood disorder (HCC) 02/02/2023   Need for hepatitis C screening test 02/02/2023   Encounter for screening for HIV 02/02/2023   Colon cancer screening 02/02/2023   Breast cancer screening by mammogram 02/02/2023   Class 1 obesity 02/02/2023   Chronic neck pain 02/02/2023   Family history of pancreatic cancer 12/19/2021   Mixed  stress and urge urinary incontinence 07/31/2018   Lichen sclerosus 07/01/2017   Anxiety and depression 10/11/2015   Gastro-esophageal reflux disease without esophagitis 07/12/2015   Acquired hypothyroidism 06/14/2015   Essential (primary) hypertension 06/14/2015   Female climacteric state 06/14/2015   Hyperlipidemia 06/14/2015    PCP: Dr. [name]    REFERRING PROVIDER: Dr. [name]   REFERRING DIAG: M54.2 (ICD-10-CM) - Neck pain  THERAPY DIAG:  Cervicalgia  Rationale for Evaluation and Treatment: Rehabilitation  ONSET DATE: 08/26/2018   SUBJECTIVE:                                                                                                                                                                                                          SUBJECTIVE STATEMENT: Pt states that she feels that her posture is improving, but that she still feels her intermittent neck pain.   Hand dominance: Right  PERTINENT HISTORY:  Pt reports that she started to experience worsening neck pain over the past several years that starts in her cervical spine and radiates down bilateral shoulders. She also notes numbness, tingling, and pain that radiates down bilateral UE into thumbs and dorsal wrist. She describes needing to keep arms by her side to avoid exacerbating her pain. She reports having a difficulty time opening jars and difficulty laying flat on her back.   PAIN:  Are you having pain? Yes: NPRS scale: 5/10 Pain location: Cervical spine and poster Pain description: Achy and sharp  Aggravating factors: Shoulder abduction and shoulders being exposed to cold air  Relieving factors: Side Sleeping, volatren gel    PRECAUTIONS: None  RED FLAGS: None     WEIGHT BEARING RESTRICTIONS: No  FALLS:  Has patient fallen in last 6 months? 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   NEXT MD VISIT: Scheduled to come back in a year   OBJECTIVE:   VITALS: BP 134/93  Sp02 98% HR 72   DIAGNOSTIC FINDINGS:  CLINICAL DATA:  Chronic neck pain   EXAM: CERVICAL SPINE - COMPLETE 4+ VIEW   COMPARISON:  None Available.   FINDINGS: Frontal, bilateral oblique, and lateral views of the cervical spine are obtained. There is straightening of the cervical spine from the C2 through C6 levels, with exaggerated kyphosis at the cervicothoracic junction. This  is likely degenerative in nature. There are no acute displaced fractures.    Moderate spondylosis at C5-6 and C6-7. Mild facet hypertrophy at the cervicothoracic junction. Prevertebral soft tissues are unremarkable. Evaluation of the neural foramina is limited by suboptimal positioning.   IMPRESSION: 1. Lower cervical degenerative changes, greatest at C5-6 and C6-7. 2. No acute fracture.     Electronically Signed   By: Sharlet Salina M.D.   On: 04/07/2023 06:57    PATIENT SURVEYS:  FOTO 58/100 with target 62   COGNITION: Overall cognitive status: Within functional limits for tasks assessed  SENSATION: Light touch: Numbness and tingling on palmer side of right hand   POSTURE: No Significant postural limitations  PALPATION: C7 TTP    CERVICAL ROM:   Active ROM A/PROM (deg) eval  Flexion 45*  Extension 45  Right lateral flexion 35  Left lateral flexion 45  Right rotation 45  Left rotation 60   (Blank rows = not tested)  UPPER EXTREMITY ROM:  Active ROM Right eval Left eval  Shoulder flexion 160 160  Shoulder extension    Shoulder abduction    Shoulder adduction    Shoulder extension    Shoulder internal rotation    Shoulder external rotation    Elbow flexion    Elbow extension    Wrist flexion    Wrist extension    Wrist ulnar deviation    Wrist radial deviation    Wrist pronation    Wrist supination     (Blank rows = not tested)  UPPER EXTREMITY MMT:  MMT Right eval Left eval  Shoulder flexion 4- 4-  Shoulder extension    Shoulder abduction 4 4  Shoulder adduction    Shoulder extension 4- 4-  Shoulder internal rotation    Shoulder external rotation    Middle trapezius NT  NT   Lower trapezius NT  NT   Elbow flexion    Elbow extension    Wrist flexion    Wrist extension    Wrist ulnar deviation    Wrist radial deviation    Wrist pronation    Wrist supination    Grip strength Poor  Good   (Blank rows = not tested)  CERVICAL SPECIAL TESTS:  Neck flexor muscle endurance test: NT  and Spurling's test:  Negative    TODAY'S TREATMENT:                                                                                                                              DATE:   05/14/23: THEREX  UBE with seat at 10 with resistance at 3- 2.5 forward and 2.5 backward  OMEGA Seated Rows #40 3 x 10  OMEGA Lat Pull Downs #30 3 x 10  Bent Over Horizontal Abduction #1 DB with LUE 3 x 10  Standing Low Trap Setting 1 x 10  Standing Low Trap Setting at Wall with Resistance at Wall using Yellow band 2 x  10  FOTO: 59   05/12/23: THEREX  UBE with seat at 10 with resistance at 3- 2.5 forward and 2.5 backward  OMEGA Seated Rows #25 1 x 10  OMEGA Seated Rows #35 2 x 10  OMEGA Lat Pull Down #25 1 x 10  OMEGA Lat Pull Down #30 2 x 10  Eccentric Shoulder Flexion to 90 degrees with yellow band 2 x 10  Eccentric Shoulder Flexion to 160 degrees with wall climbs 1 x 5  -Pt reports increased right shoulder pain.   Speed's test: + RUE  Infraspinatus Test: Negative Bilaterally  Lift off: Negative Bilaterally   MANUAL   Myofascial release of upper trap and cervical paraspinals   05/07/23: THEREX  UBE with seat at 10 with resistance at 3- 2.5 forward and 2.5 backward  Shoulder Horizontal Abduction at 90 deg 3 x 10  OMEGA Lat Pull down with #20 1 x 10  OMEGA Lat Pull down with #25 2 x 10  MANUAL  CPA C2-C7 Mobs grade III x 20  Upper trap and cervical paraspinal massage and trigger point release   MODALITY   Performed by: Ellin Goodie PT, DPT Trigger Point Dry-Needling  Treatment instructions: Expect mild to moderate muscle soreness. S/S of pneumothorax if dry needled over a lung field, and to seek immediate medical attention should they occur. Patient verbalized understanding of these instructions and education.  Patient Consent Given: Yes Muscles treated: R suboccipital  Electrical stimulation performed: No Parameters: N/A Treatment response/outcome: Decreased tension in right suboccipital        PATIENT EDUCATION:  Education details: form and technique for correct performance of exercise  Person educated: Patient Education method: Explanation, Demonstration, Verbal cues, and Handouts Education comprehension: verbalized understanding, returned demonstration, and verbal cues required  HOME EXERCISE PROGRAM: Access Code: [email or web address] URL: [email or web address] Date: 05/14/2023 Prepared by: Ellin Goodie  Exercises - Seated Cervical Retraction  - 3-4 x weekly - 3 sets - 10 reps - 2 sec  hold - Seated Cervical Sidebending Stretch  - 1 x daily - 3 reps - 30 sec  hold - Sternocleidomastoid Stretch  - 1 x daily - 3 reps - 30 sec  hold - Seated Shoulder Row with Anchored Resistance  - 3-4 x weekly - 3 sets - 10 reps - Single Arm Bent Over Shoulder Horizontal Abduction with Dumbbell - Palm Down  - 3-4 x weekly - 3 sets - 10 reps - Standing Low Trap Setting with Resistance at Wall  - 3-4 x weekly - 3 sets - 10 reps - Standing Eccentric Shoulder Flexion at Wall  - 3-4 x weekly - 3 sets - 10 reps  ASSESSMENT:  CLINICAL IMPRESSION: Pt shows progress towards goals with improved cervical and shoulder function as evidenced by performing strengthening exercises with increased resistance and improved FOTO score. Modified HEP to include exercises with increased difficulty. She will continue to benefit from skilled PT interventions to address remaining strength, pain and radicular related deficits to return to PLOF and improve QoL.    OBJECTIVE IMPAIRMENTS: decreased ROM, hypomobility, impaired sensation, postural dysfunction, and pain.   ACTIVITY LIMITATIONS: carrying, lifting, sleeping, and reach over head  PARTICIPATION LIMITATIONS: cleaning, laundry, shopping, community activity, and yard work  PERSONAL FACTORS: Fitness, Time since onset of injury/illness/exacerbation, and 3+ comorbidities: Graves disease, HTN, Sleep Apnea   are also affecting patient's functional  outcome.   REHAB POTENTIAL: Good  CLINICAL DECISION MAKING: Stable/uncomplicated  EVALUATION COMPLEXITY: Low   GOALS: Goals reviewed with patient?  No  SHORT TERM GOALS: Target date: 05/14/2023  Pt will be independent with HEP in order to improve strength and balance in order to decrease fall risk and improve function at home and work. Baseline: NT 05/07/23 Performing independently  Goal status: ACHIEVED     LONG TERM GOALS: Target date: 07/09/2023  Patient will have improved function and activity level as evidenced by an increase in FOTO score to target score.  Baseline: 58/100 with target of 62  05/14/23: 59 Goal status: ONGOING   2. Patient will improve right cervical lateral flexion AROM to be symmetrical left cervical flexion for improved cervical function and ability to better navigate environment while walking.  Baseline: Lateral Flex R/L 35 deg/45 deg  Goal status: ONGOING   3.  Patient will improve shoulder and periscapular strength by at least 1/3 grade MMT (4- to 4) for improved cervical function and ability to maintain improved posture to relieve subacromial impingement symptoms.  Baseline: Shoulder Flex R/L 4-/4-, Shoulder Abd R/L 4/4,  Goal status: ONGOING     PLAN:  PT FREQUENCY: 1-2x/week  PT DURATION: 10 weeks  PLANNED INTERVENTIONS: Therapeutic exercises, Neuromuscular re-education, Patient/Family education, Joint mobilization, Joint manipulation, Aquatic Therapy, Dry Needling, Electrical stimulation, Spinal manipulation, Spinal mobilization, Cryotherapy, Moist heat, Manual therapy, and Re-evaluation  PLAN FOR NEXT SESSION:  Dry needling of rectus capitus. Rhomboid stretch, face pulls, and D2 PNF Flexion and Extension    Ellin Goodie PT, DPT  [facility] [address], [place], [address] Phone: [phone]   Fax:  [phone]

## 2023-05-19 ENCOUNTER — Ambulatory Visit: Admitting: Physical Therapy

## 2023-05-19 DIAGNOSIS — M542 Cervicalgia: Secondary | ICD-10-CM

## 2023-05-19 NOTE — Therapy (Signed)
OUTPATIENT PHYSICAL THERAPY CERVICAL TREATMENT   Patient Name: Emma Barker MRN: [id number] DOB:02-27-1966, 57 y.o., female Today's Date: 05/19/2023  END OF SESSION:  PT End of Session - 05/19/23 1523     Visit Number 6    Number of Visits 20    Date for PT Re-Evaluation 07/08/23    Authorization Type [employer]    Authorization - Visit Number 6    Authorization - Number of Visits 20    Progress Note Due on Visit 10    PT Start Time 1520    PT Stop Time 1600    PT Time Calculation (min) 40 min    Activity Tolerance Patient tolerated treatment well    Behavior During Therapy [facility] for tasks assessed/performed               Past Medical History:  Diagnosis Date   Acute ethmoidal sinusitis    Anxiety    agoraphobia   Arthritis    osteo - everywhere   BRCA negative 12/2021   MyRisk neg; IBIS=6.0%/riskscore=2.8%   Chest pain    related to thyroid issues   Dental infection 12/11/2014   Facial cellulitis    Family history of pancreatic cancer 12/2021   My Risk neg   GERD (gastroesophageal reflux disease)    Graves disease    Graves disease    Graves' disease    Graves' disease    Headache    sinus   Hereditary pancreatitis    Hypertension    Hypokalemia    Hypothyroid    Leukocytosis 12/11/2014   Lichen sclerosus of female genitalia    Mitral valve regurgitation    PONV (postoperative nausea and vomiting)    SIRS (systemic inflammatory response syndrome) (HCC) 12/11/2014   Sleep apnea    supposed to use CPAP - no machine   Squamous cell carcinoma of skin 09/21/2019   left forehead above mid brow [facility])   Stress incontinence    Tricuspid valve regurgitation    Von Willebrand's disease (HCC)    Wears contact lenses    Past Surgical History:  Procedure Laterality Date   ABDOMINAL HYSTERECTOMY     APPENDECTOMY     BREAST EXCISIONAL BIOPSY Right    2014   CATARACT EXTRACTION W/PHACO Right 03/09/2018   Procedure: CATARACT EXTRACTION PHACO AND  INTRAOCULAR LENS PLACEMENT (IOC) RIGHT IVA TOPICAL SYMFONY LENS;  Surgeon: Nevada Crane, MD;  Location: [facility];  Service: Ophthalmology;  Laterality: Right;   CATARACT EXTRACTION W/PHACO Left 03/30/2018   Procedure: CATARACT EXTRACTION PHACO AND INTRAOCULAR LENS PLACEMENT (IOC) SYMFONY LENS LEFT;  Surgeon: Nevada Crane, MD;  Location: [facility];  Service: Ophthalmology;  Laterality: Left;   CHOLECYSTECTOMY     IMAGE GUIDED SINUS SURGERY Right 05/16/2015   Procedure: IMAGE GUIDED SINUS SURGERY, RIGHT ENDOSCOPIC MAXILLARY ANTROSTOMY, RIGHT ENDOSCOPIC ANTERIOR ETHMOIDECTOMY AND RIGHT ENDOSCOPIC FRONTAL RECESS EXPLORATION;  Surgeon: Geanie Logan, MD;  Location: [facility];  Service: ENT;  Laterality: Right;  GAVE DISK TO CE CE   POLYPECTOMY     Patient Active Problem List   Diagnosis Date Noted   Tobacco use 05/02/2023   Mood disorder (HCC) 02/02/2023   Need for hepatitis C screening test 02/02/2023   Encounter for screening for HIV 02/02/2023   Colon cancer screening 02/02/2023   Breast cancer screening by mammogram 02/02/2023   Class 1 obesity 02/02/2023   Chronic neck pain 02/02/2023   Family history of pancreatic cancer 12/19/2021  Mixed stress and urge urinary incontinence 07/31/2018   Lichen sclerosus 07/01/2017   Anxiety and depression 10/11/2015   Gastro-esophageal reflux disease without esophagitis 07/12/2015   Acquired hypothyroidism 06/14/2015   Essential (primary) hypertension 06/14/2015   Female climacteric state 06/14/2015   Hyperlipidemia 06/14/2015    PCP: Dr. [name]    REFERRING PROVIDER: Dr. [name]   REFERRING DIAG: M54.2 (ICD-10-CM) - Neck pain  THERAPY DIAG:  No diagnosis found.  Rationale for Evaluation and Treatment: Rehabilitation  ONSET DATE: 08/26/2018   SUBJECTIVE:                                                                                                                                                                                                          SUBJECTIVE STATEMENT: Pt states that she is feeling increased neck pain and she thinks it is because of the weather.   Hand dominance: Right  PERTINENT HISTORY:  Pt reports that she started to experience worsening neck pain over the past several years that starts in her cervical spine and radiates down bilateral shoulders. She also notes numbness, tingling, and pain that radiates down bilateral UE into thumbs and dorsal wrist. She describes needing to keep arms by her side to avoid exacerbating her pain. She reports having a difficulty time opening jars and difficulty laying flat on her back.   PAIN:  Are you having pain? Yes: NPRS scale: 5/10 Pain location: Cervical spine and poster Pain description: Achy and sharp  Aggravating factors: Shoulder abduction and shoulders being exposed to cold air  Relieving factors: Side Sleeping, volatren gel    PRECAUTIONS: None  RED FLAGS: None     WEIGHT BEARING RESTRICTIONS: No  FALLS:  Has patient fallen in last 6 months? 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   NEXT MD VISIT: Scheduled to come back in a year   OBJECTIVE:   VITALS: BP 134/93  Sp02 98% HR 72   DIAGNOSTIC FINDINGS:  CLINICAL DATA:  Chronic neck pain   EXAM: CERVICAL SPINE - COMPLETE 4+ VIEW   COMPARISON:  None Available.   FINDINGS: Frontal, bilateral oblique, and lateral views of the cervical spine are obtained. There is straightening of the cervical spine from the C2 through C6 levels, with exaggerated kyphosis at the cervicothoracic  junction. This is likely degenerative in nature. There are no acute displaced fractures.    Moderate spondylosis at C5-6 and C6-7. Mild facet hypertrophy at the cervicothoracic junction. Prevertebral soft tissues are unremarkable. Evaluation of the neural foramina is limited by suboptimal positioning.   IMPRESSION: 1. Lower cervical degenerative changes, greatest at C5-6 and C6-7. 2. No acute fracture.     Electronically Signed   By: Sharlet Salina M.D.   On: 04/07/2023 06:57    PATIENT SURVEYS:  FOTO 58/100 with target 62   COGNITION: Overall cognitive status: Within functional limits for tasks assessed  SENSATION: Light touch: Numbness and tingling on palmer side of right hand   POSTURE: No Significant postural limitations  PALPATION: C7 TTP    CERVICAL ROM:   Active ROM A/PROM (deg) eval  Flexion 45*  Extension 45  Right lateral flexion 35  Left lateral flexion 45  Right rotation 45  Left rotation 60   (Blank rows = not tested)  UPPER EXTREMITY ROM:  Active ROM Right eval Left eval  Shoulder flexion 160 160  Shoulder extension    Shoulder abduction    Shoulder adduction    Shoulder extension    Shoulder internal rotation    Shoulder external rotation    Elbow flexion    Elbow extension    Wrist flexion    Wrist extension    Wrist ulnar deviation    Wrist radial deviation    Wrist pronation    Wrist supination     (Blank rows = not tested)  UPPER EXTREMITY MMT:  MMT Right eval Left eval  Shoulder flexion 4- 4-  Shoulder extension    Shoulder abduction 4 4  Shoulder adduction    Shoulder extension 4- 4-  Shoulder internal rotation    Shoulder external rotation    Middle trapezius NT  NT   Lower trapezius NT  NT   Elbow flexion    Elbow extension    Wrist flexion    Wrist extension    Wrist ulnar deviation    Wrist radial deviation    Wrist pronation    Wrist supination    Grip strength Poor  Good   (Blank rows = not tested)  CERVICAL SPECIAL TESTS:  Neck flexor muscle endurance test: NT  and Spurling's test:  Negative    TODAY'S TREATMENT:                                                                                                                              DATE:   05/19/23: THEREX  UBE with seat at 10 with resistance at 3- 2.5 forward and 2.5 backward  OMEGA Seated Rows #45 3 x 10  OMEGA Lat Pull Down  #35 2 x 10  Seated Cervical Rotation SNAGS to right 2 x 10 with 2 sec hold  -Pt reports increased right shoulder pain  Supine  Seated Thoracic Extension with 3 sec hold 1 x  10      05/14/23: THEREX  UBE with seat at 10 with resistance at 3- 2.5 forward and 2.5 backward  OMEGA Seated Rows #40 3 x 10  OMEGA Lat Pull Downs #30 3 x 10  Bent Over Horizontal Abduction #1 DB with LUE 3 x 10  Standing Low Trap Setting 1 x 10  Standing Low Trap Setting at Wall with Resistance at Wall using Yellow band 2 x 10  FOTO: 59   05/12/23: THEREX  UBE with seat at 10 with resistance at 3- 2.5 forward and 2.5 backward  OMEGA Seated Rows #25 1 x 10  OMEGA Seated Rows #35 2 x 10  OMEGA Lat Pull Down #25 1 x 10  OMEGA Lat Pull Down #30 2 x 10  Eccentric Shoulder Flexion to 90 degrees with yellow band 2 x 10  Eccentric Shoulder Flexion to 160 degrees with wall climbs 1 x 5  -Pt reports increased right shoulder pain.   Speed's test: + RUE  Infraspinatus Test: Negative Bilaterally  Lift off: Negative Bilaterally   MANUAL   Myofascial release of upper trap and cervical paraspinals   05/07/23: THEREX  UBE with seat at 10 with resistance at 3- 2.5 forward and 2.5 backward  Shoulder Horizontal Abduction at 90 deg 3 x 10  OMEGA Lat Pull down with #20 1 x 10  OMEGA Lat Pull down with #25 2 x 10  MANUAL  CPA C2-C7 Mobs grade III x 20  Upper trap and cervical paraspinal massage and trigger point release   MODALITY   Performed by: Ellin Goodie PT, DPT Trigger Point Dry-Needling  Treatment instructions: Expect mild to moderate muscle soreness. S/S of pneumothorax if dry needled over a lung  field, and to seek immediate medical attention should they occur. Patient verbalized understanding of these instructions and education.  Patient Consent Given: Yes Muscles treated: R suboccipital  Electrical stimulation performed: No Parameters: N/A Treatment response/outcome: Decreased tension in right suboccipital       PATIENT EDUCATION:  Education details: form and technique for correct performance of exercise  Person educated: Patient Education method: Explanation, Demonstration, Verbal cues, and Handouts Education comprehension: verbalized understanding, returned demonstration, and verbal cues required  HOME EXERCISE PROGRAM: Access Code: [email or web address] URL: [email or web address] Date: 05/19/2023 Prepared by: Ellin Goodie  Exercises - Seated Cervical Retraction  - 3-4 x weekly - 3 sets - 10 reps - 2 sec  hold - Seated Cervical Sidebending Stretch  - 1 x daily - 3 reps - 30 sec  hold - Sternocleidomastoid Stretch  - 1 x daily - 3 reps - 30 sec  hold - Seated Assisted Cervical Rotation with Towel  - 1 x daily - 10 reps - 2 sec  hold - Seated Thoracic Lumbar Extension  - 1 x daily - 1 sets - 10 reps - 3 sec  hold - Standing Low Trap Setting with Resistance at Wall  - 3-4 x weekly - 3 sets - 10 reps - Standing Eccentric Shoulder Flexion at Wall  - 3-4 x weekly - 3 sets - 10 reps  ASSESSMENT:  CLINICAL IMPRESSION: Pt shows ongoing cervical restrictions. Modified HEP to include cervical rotation SNAGs for increased cervical mobility along with supine position change and towel over right shoulder. She will continue to benefit from skilled PT interventions to address remaining strength, pain and radicular related deficits to return to PLOF and improve QoL.    OBJECTIVE IMPAIRMENTS: decreased ROM, hypomobility, impaired sensation, postural dysfunction, and pain.  ACTIVITY LIMITATIONS: carrying, lifting, sleeping, and reach over head  PARTICIPATION LIMITATIONS:  cleaning, laundry, shopping, community activity, and yard work  PERSONAL FACTORS: Fitness, Time since onset of injury/illness/exacerbation, and 3+ comorbidities: Graves disease, HTN, Sleep Apnea   are also affecting patient's functional outcome.   REHAB POTENTIAL: Good  CLINICAL DECISION MAKING: Stable/uncomplicated  EVALUATION COMPLEXITY: Low   GOALS: Goals reviewed with patient? No  SHORT TERM GOALS: Target date: 05/14/2023  Pt will be independent with HEP in order to improve strength and balance in order to decrease fall risk and improve function at home and work. Baseline: NT 05/07/23 Performing independently  Goal status: ACHIEVED     LONG TERM GOALS: Target date: 07/09/2023  Patient will have improved function and activity level as evidenced by an increase in FOTO score to target score.  Baseline: 58/100 with target of 62  05/14/23: 59 Goal status: ONGOING   2. Patient will improve right cervical lateral flexion AROM to be symmetrical left cervical flexion for improved cervical function and ability to better navigate environment while walking.  Baseline: Lateral Flex R/L 35 deg/45 deg  Goal status: ONGOING   3.  Patient will improve shoulder and periscapular strength by at least 1/3 grade MMT (4- to 4) for improved cervical function and ability to maintain improved posture to relieve subacromial impingement symptoms.  Baseline: Shoulder Flex R/L 4-/4-, Shoulder Abd R/L 4/4,  Goal status: ONGOING     PLAN:  PT FREQUENCY: 1-2x/week  PT DURATION: 10 weeks  PLANNED INTERVENTIONS: Therapeutic exercises, Neuromuscular re-education, Patient/Family education, Joint mobilization, Joint manipulation, Aquatic Therapy, Dry Needling, Electrical stimulation, Spinal manipulation, Spinal mobilization, Cryotherapy, Moist heat, Manual therapy, and Re-evaluation  PLAN FOR NEXT SESSION:  Dry needling of rectus capitus. Cervical mobilizations grade III and IV. Rhomboid stretch, face  pulls, and D2 PNF Flexion and Extension. Cervical Side Bending with mobilizations     Ellin Goodie PT, DPT  [facility] [address], [place], [address] Phone: [phone]   Fax:  [phone]

## 2023-05-21 ENCOUNTER — Ambulatory Visit: Admitting: Physical Therapy

## 2023-05-21 DIAGNOSIS — M542 Cervicalgia: Secondary | ICD-10-CM | POA: Diagnosis not present

## 2023-05-21 NOTE — Therapy (Signed)
OUTPATIENT PHYSICAL THERAPY CERVICAL TREATMENT   Patient Name: Emma Barker MRN: [id number] DOB:1966/02/07, 57 y.o., female Today's Date: 05/21/2023  END OF SESSION:  PT End of Session - 05/21/23 1519     Visit Number 7    Number of Visits 20    Date for PT Re-Evaluation 07/08/23    Authorization Type [employer]    Authorization - Visit Number 7    Authorization - Number of Visits 20    Progress Note Due on Visit 10    PT Start Time 1515    PT Stop Time 1600    PT Time Calculation (min) 45 min    Activity Tolerance Patient tolerated treatment well    Behavior During Therapy [facility] for tasks assessed/performed                Past Medical History:  Diagnosis Date   Acute ethmoidal sinusitis    Anxiety    agoraphobia   Arthritis    osteo - everywhere   BRCA negative 12/2021   MyRisk neg; IBIS=6.0%/riskscore=2.8%   Chest pain    related to thyroid issues   Dental infection 12/11/2014   Facial cellulitis    Family history of pancreatic cancer 12/2021   My Risk neg   GERD (gastroesophageal reflux disease)    Graves disease    Graves disease    Graves' disease    Graves' disease    Headache    sinus   Hereditary pancreatitis    Hypertension    Hypokalemia    Hypothyroid    Leukocytosis 12/11/2014   Lichen sclerosus of female genitalia    Mitral valve regurgitation    PONV (postoperative nausea and vomiting)    SIRS (systemic inflammatory response syndrome) (HCC) 12/11/2014   Sleep apnea    supposed to use CPAP - no machine   Squamous cell carcinoma of skin 09/21/2019   left forehead above mid brow [facility])   Stress incontinence    Tricuspid valve regurgitation    Von Willebrand's disease (HCC)    Wears contact lenses    Past Surgical History:  Procedure Laterality Date   ABDOMINAL HYSTERECTOMY     APPENDECTOMY     BREAST EXCISIONAL BIOPSY Right    2014   CATARACT EXTRACTION W/PHACO Right 03/09/2018   Procedure: CATARACT EXTRACTION PHACO AND  INTRAOCULAR LENS PLACEMENT (IOC) RIGHT IVA TOPICAL SYMFONY LENS;  Surgeon: Nevada Crane, MD;  Location: [facility];  Service: Ophthalmology;  Laterality: Right;   CATARACT EXTRACTION W/PHACO Left 03/30/2018   Procedure: CATARACT EXTRACTION PHACO AND INTRAOCULAR LENS PLACEMENT (IOC) SYMFONY LENS LEFT;  Surgeon: Nevada Crane, MD;  Location: [facility];  Service: Ophthalmology;  Laterality: Left;   CHOLECYSTECTOMY     IMAGE GUIDED SINUS SURGERY Right 05/16/2015   Procedure: IMAGE GUIDED SINUS SURGERY, RIGHT ENDOSCOPIC MAXILLARY ANTROSTOMY, RIGHT ENDOSCOPIC ANTERIOR ETHMOIDECTOMY AND RIGHT ENDOSCOPIC FRONTAL RECESS EXPLORATION;  Surgeon: Geanie Logan, MD;  Location: [facility];  Service: ENT;  Laterality: Right;  GAVE DISK TO CE CE   POLYPECTOMY     Patient Active Problem List   Diagnosis Date Noted   Tobacco use 05/02/2023   Mood disorder (HCC) 02/02/2023   Need for hepatitis C screening test 02/02/2023   Encounter for screening for HIV 02/02/2023   Colon cancer screening 02/02/2023   Breast cancer screening by mammogram 02/02/2023   Class 1 obesity 02/02/2023   Chronic neck pain 02/02/2023   Family history of pancreatic cancer 12/19/2021  Mixed stress and urge urinary incontinence 07/31/2018   Lichen sclerosus 07/01/2017   Anxiety and depression 10/11/2015   Gastro-esophageal reflux disease without esophagitis 07/12/2015   Acquired hypothyroidism 06/14/2015   Essential (primary) hypertension 06/14/2015   Female climacteric state 06/14/2015   Hyperlipidemia 06/14/2015    PCP: Dr. [name]    REFERRING PROVIDER: Dr. [name]   REFERRING DIAG: M54.2 (ICD-10-CM) - Neck pain  THERAPY DIAG:  No diagnosis found.  Rationale for Evaluation and Treatment: Rehabilitation  ONSET DATE: 08/26/2018   SUBJECTIVE:                                                                                                                                                                                                          SUBJECTIVE STATEMENT: Pt reports that she felt increased neck pain after last session and she thinks it was from the cervical mobilizations.   Hand dominance: Right  PERTINENT HISTORY:  Pt reports that she started to experience worsening neck pain over the past several years that starts in her cervical spine and radiates down bilateral shoulders. She also notes numbness, tingling, and pain that radiates down bilateral UE into thumbs and dorsal wrist. She describes needing to keep arms by her side to avoid exacerbating her pain. She reports having a difficulty time opening jars and difficulty laying flat on her back.   PAIN:  Are you having pain? Yes: NPRS scale: 3-4/10 Pain location: Cervical spine Pain description: Achy and sharp  Aggravating factors: Shoulder abduction and shoulders being exposed to cold air  Relieving factors: Side Sleeping, volatren gel    PRECAUTIONS: None  RED FLAGS: None     WEIGHT BEARING RESTRICTIONS: No  FALLS:  Has patient fallen in last 6 months? 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   NEXT MD VISIT: Scheduled to come back in a year   OBJECTIVE:   VITALS: BP 134/93  Sp02 98% HR 72   DIAGNOSTIC FINDINGS:  CLINICAL DATA:  Chronic neck pain   EXAM: CERVICAL SPINE - COMPLETE 4+ VIEW   COMPARISON:  None Available.   FINDINGS: Frontal, bilateral oblique, and lateral views of the cervical spine are obtained. There is straightening of the cervical spine from the C2 through C6 levels, with exaggerated kyphosis at the cervicothoracic  junction. This is likely degenerative in nature. There are no acute displaced  fractures.   Moderate spondylosis at C5-6 and C6-7. Mild facet hypertrophy at the cervicothoracic junction. Prevertebral soft tissues are unremarkable. Evaluation of the neural foramina is limited by suboptimal positioning.   IMPRESSION: 1. Lower cervical degenerative changes, greatest at C5-6 and C6-7. 2. No acute fracture.     Electronically Signed   By: Sharlet Salina M.D.   On: 04/07/2023 06:57    PATIENT SURVEYS:  FOTO 58/100 with target 62   COGNITION: Overall cognitive status: Within functional limits for tasks assessed  SENSATION: Light touch: Numbness and tingling on palmer side of right hand   POSTURE: No Significant postural limitations  PALPATION: C7 TTP    CERVICAL ROM:   Active ROM A/PROM (deg) eval  Flexion 45*  Extension 45  Right lateral flexion 35  Left lateral flexion 45  Right rotation 45  Left rotation 60   (Blank rows = not tested)  UPPER EXTREMITY ROM:  Active ROM Right eval Left eval  Shoulder flexion 160 160  Shoulder extension    Shoulder abduction    Shoulder adduction    Shoulder extension    Shoulder internal rotation    Shoulder external rotation    Elbow flexion    Elbow extension    Wrist flexion    Wrist extension    Wrist ulnar deviation    Wrist radial deviation    Wrist pronation    Wrist supination     (Blank rows = not tested)  UPPER EXTREMITY MMT:  MMT Right eval Left eval  Shoulder flexion 4- 4-  Shoulder extension    Shoulder abduction 4 4  Shoulder adduction    Shoulder extension 4- 4-  Shoulder internal rotation    Shoulder external rotation    Middle trapezius NT  NT   Lower trapezius NT  NT   Elbow flexion    Elbow extension    Wrist flexion    Wrist extension    Wrist ulnar deviation    Wrist radial deviation    Wrist pronation    Wrist supination    Grip strength Poor  Good   (Blank rows = not tested)  CERVICAL SPECIAL TESTS:  Neck flexor muscle endurance test: NT  and Spurling's  test: Negative    TODAY'S TREATMENT:                                                                                                                              DATE:   05/21/23:  THEREX  UBE with seat at 10 with resistance at 3- 3 min forward and 3 min backward  OMEGA Rows #45 3 x 10 OMEGA Lat Pull Downs #35 3 x 10   OMEGA Face Pulls #5 1 x 10   Face Pulls yellow 2 x 10  Right cervical side bend 40 deg   MANUAL  Cervical distraction  Left upper  trap stretch  Right side bend mobilizations grade III-IV   05/19/23: THEREX  UBE with seat at 10 with resistance at 3- 2.5 forward and 2.5 backward  OMEGA Seated Rows #45 3 x 10  OMEGA Lat Pull Down  #35 2 x 10  Seated Cervical Rotation SNAGS to right 2 x 10 with 2 sec hold  -Pt reports increased right shoulder pain  Supine  Seated Thoracic Extension with 3 sec hold 1 x 10      05/14/23: THEREX  UBE with seat at 10 with resistance at 3- 2.5 forward and 2.5 backward  OMEGA Seated Rows #40 3 x 10  OMEGA Lat Pull Downs #30 3 x 10  Bent Over Horizontal Abduction #1 DB with LUE 3 x 10  Standing Low Trap Setting 1 x 10  Standing Low Trap Setting at Wall with Resistance at Wall using Yellow band 2 x 10  FOTO: 59   05/12/23: THEREX  UBE with seat at 10 with resistance at 3- 2.5 forward and 2.5 backward  OMEGA Seated Rows #25 1 x 10  OMEGA Seated Rows #35 2 x 10  OMEGA Lat Pull Down #25 1 x 10  OMEGA Lat Pull Down #30 2 x 10  Eccentric Shoulder Flexion to 90 degrees with yellow band 2 x 10  Eccentric Shoulder Flexion to 160 degrees with wall climbs 1 x 5  -Pt reports increased right shoulder pain.   Speed's test: + RUE  Infraspinatus Test: Negative Bilaterally  Lift off: Negative Bilaterally   MANUAL   Myofascial release of upper trap and cervical paraspinals   05/07/23: THEREX  UBE with seat at 10 with resistance at 3- 2.5 forward and 2.5 backward  Shoulder Horizontal Abduction at 90 deg 3 x 10  OMEGA Lat Pull down with  #20 1 x 10  OMEGA Lat Pull down with #25 2 x 10  MANUAL  CPA C2-C7 Mobs grade III x 20  Upper trap and cervical paraspinal massage and trigger point release   MODALITY   Performed by: Ellin Goodie PT, DPT Trigger Point Dry-Needling  Treatment instructions: Expect mild to moderate muscle soreness. S/S of pneumothorax if dry needled over a lung field, and to seek immediate medical attention should they occur. Patient verbalized understanding of these instructions and education.  Patient Consent Given: Yes Muscles treated: R suboccipital  Electrical stimulation performed: No Parameters: N/A Treatment response/outcome: Decreased tension in right suboccipital       PATIENT EDUCATION:  Education details: form and technique for correct performance of exercise  Person educated: Patient Education method: Explanation, Demonstration, Verbal cues, and Handouts Education comprehension: verbalized understanding, returned demonstration, and verbal cues required  HOME EXERCISE PROGRAM: Access Code: [email or web address] URL: [email or web address] Date: 05/21/2023 Prepared by: Ellin Goodie  Exercises - Seated Cervical Retraction  - 3-4 x weekly - 3 sets - 10 reps - 2 sec  hold - Seated Cervical Sidebending Stretch  - 1 x daily - 3 reps - 30 sec  hold - Sternocleidomastoid Stretch  - 1 x daily - 3 reps - 30 sec  hold - Seated Assisted Cervical Rotation with Towel  - 1 x daily - 10 reps - 2 sec  hold - Seated Thoracic Lumbar Extension  - 1 x daily - 1 sets - 10 reps - 3 sec  hold - Standing Eccentric Shoulder Flexion at Wall  - 3-4 x weekly - 3 sets - 10 reps - Face Pulls  - 3-4 x weekly - 3 sets -  10 reps - Seated Cervical Sidebending with Strap and Overpressure  - 1 x daily - 1 sets - 10 reps - 2 sec  hold  ASSESSMENT:  CLINICAL IMPRESSION: Pt shows improvement with cervical mobility with improved left side bending. She continues to progress periscapular strength with increased  resistance of seated rows and lat pull down and introduction of face pullShe will continue to benefit from skilled PT interventions to address remaining strength, pain and radicular related deficits to return to PLOF and improve QoL.    OBJECTIVE IMPAIRMENTS: decreased ROM, hypomobility, impaired sensation, postural dysfunction, and pain.   ACTIVITY LIMITATIONS: carrying, lifting, sleeping, and reach over head  PARTICIPATION LIMITATIONS: cleaning, laundry, shopping, community activity, and yard work  PERSONAL FACTORS: Fitness, Time since onset of injury/illness/exacerbation, and 3+ comorbidities: Graves disease, HTN, Sleep Apnea   are also affecting patient's functional outcome.   REHAB POTENTIAL: Good  CLINICAL DECISION MAKING: Stable/uncomplicated  EVALUATION COMPLEXITY: Low   GOALS: Goals reviewed with patient? No  SHORT TERM GOALS: Target date: 05/14/2023  Pt will be independent with HEP in order to improve strength and balance in order to decrease fall risk and improve function at home and work. Baseline: NT 05/07/23 Performing independently  Goal status: ACHIEVED     LONG TERM GOALS: Target date: 07/09/2023  Patient will have improved function and activity level as evidenced by an increase in FOTO score to target score.  Baseline: 58/100 with target of 62  05/14/23: 59 Goal status: ONGOING   2. Patient will improve right cervical lateral flexion AROM to be symmetrical left cervical flexion for improved cervical function and ability to better navigate environment while walking.  Baseline: Lateral Flex R/L 35 deg/45 deg 05/21/23: Cervical Lateral Flex R/L 40/45  Goal status: ONGOING   3.  Patient will improve shoulder and periscapular strength by at least 1/3 grade MMT (4- to 4) for improved cervical function and ability to maintain improved posture to relieve subacromial impingement symptoms.  Baseline: Shoulder Flex R/L 4-/4-, Shoulder Abd R/L 4/4,  Goal status: ONGOING      PLAN:  PT FREQUENCY: 1-2x/week  PT DURATION: 10 weeks  PLANNED INTERVENTIONS: Therapeutic exercises, Neuromuscular re-education, Patient/Family education, Joint mobilization, Joint manipulation, Aquatic Therapy, Dry Needling, Electrical stimulation, Spinal manipulation, Spinal mobilization, Cryotherapy, Moist heat, Manual therapy, and Re-evaluation  PLAN FOR NEXT SESSION:  Shoulder MMT. Dry needling of rectus capitus. Cervical mobilizations grade III and IV. Rhomboid stretch and D2 PNF Flexion and Extension.    Ellin Goodie PT, DPT  [facility] [address], [place], [address] Phone: [phone]   Fax:  [phone]

## 2023-05-26 ENCOUNTER — Encounter: Payer: Self-pay | Admitting: Physical Therapy

## 2023-05-26 ENCOUNTER — Ambulatory Visit: Admitting: Physical Therapy

## 2023-05-26 DIAGNOSIS — M542 Cervicalgia: Secondary | ICD-10-CM

## 2023-05-26 NOTE — Therapy (Signed)
OUTPATIENT PHYSICAL THERAPY CERVICAL TREATMENT   Patient Name: Emma Barker MRN: [id number] DOB:16-May-1966, 57 y.o., female Today's Date: 05/26/2023  END OF SESSION:  PT End of Session - 05/26/23 1521     Visit Number 8    Number of Visits 20    Date for PT Re-Evaluation 07/08/23    Authorization Type [employer]    Authorization - Visit Number 8    Authorization - Number of Visits 20    Progress Note Due on Visit 10    PT Start Time 1518    PT Stop Time 1600    PT Time Calculation (min) 42 min    Activity Tolerance Patient tolerated treatment well    Behavior During Therapy [facility] for tasks assessed/performed                 Past Medical History:  Diagnosis Date   Acute ethmoidal sinusitis    Anxiety    agoraphobia   Arthritis    osteo - everywhere   BRCA negative 12/2021   MyRisk neg; IBIS=6.0%/riskscore=2.8%   Chest pain    related to thyroid issues   Dental infection 12/11/2014   Facial cellulitis    Family history of pancreatic cancer 12/2021   My Risk neg   GERD (gastroesophageal reflux disease)    Graves disease    Graves disease    Graves' disease    Graves' disease    Headache    sinus   Hereditary pancreatitis    Hypertension    Hypokalemia    Hypothyroid    Leukocytosis 12/11/2014   Lichen sclerosus of female genitalia    Mitral valve regurgitation    PONV (postoperative nausea and vomiting)    SIRS (systemic inflammatory response syndrome) (HCC) 12/11/2014   Sleep apnea    supposed to use CPAP - no machine   Squamous cell carcinoma of skin 09/21/2019   left forehead above mid brow [facility])   Stress incontinence    Tricuspid valve regurgitation    Von Willebrand's disease (HCC)    Wears contact lenses    Past Surgical History:  Procedure Laterality Date   ABDOMINAL HYSTERECTOMY     APPENDECTOMY     BREAST EXCISIONAL BIOPSY Right    2014   CATARACT EXTRACTION W/PHACO Right 03/09/2018   Procedure: CATARACT EXTRACTION PHACO AND  INTRAOCULAR LENS PLACEMENT (IOC) RIGHT IVA TOPICAL SYMFONY LENS;  Surgeon: Nevada Crane, MD;  Location: [facility];  Service: Ophthalmology;  Laterality: Right;   CATARACT EXTRACTION W/PHACO Left 03/30/2018   Procedure: CATARACT EXTRACTION PHACO AND INTRAOCULAR LENS PLACEMENT (IOC) SYMFONY LENS LEFT;  Surgeon: Nevada Crane, MD;  Location: [facility];  Service: Ophthalmology;  Laterality: Left;   CHOLECYSTECTOMY     IMAGE GUIDED SINUS SURGERY Right 05/16/2015   Procedure: IMAGE GUIDED SINUS SURGERY, RIGHT ENDOSCOPIC MAXILLARY ANTROSTOMY, RIGHT ENDOSCOPIC ANTERIOR ETHMOIDECTOMY AND RIGHT ENDOSCOPIC FRONTAL RECESS EXPLORATION;  Surgeon: Geanie Logan, MD;  Location: [facility];  Service: ENT;  Laterality: Right;  GAVE DISK TO CE CE   POLYPECTOMY     Patient Active Problem List   Diagnosis Date Noted   Tobacco use 05/02/2023   Mood disorder (HCC) 02/02/2023   Need for hepatitis C screening test 02/02/2023   Encounter for screening for HIV 02/02/2023   Colon cancer screening 02/02/2023   Breast cancer screening by mammogram 02/02/2023   Class 1 obesity 02/02/2023   Chronic neck pain 02/02/2023   Family history of pancreatic cancer 12/19/2021  Mixed stress and urge urinary incontinence 07/31/2018   Lichen sclerosus 07/01/2017   Anxiety and depression 10/11/2015   Gastro-esophageal reflux disease without esophagitis 07/12/2015   Acquired hypothyroidism 06/14/2015   Essential (primary) hypertension 06/14/2015   Female climacteric state 06/14/2015   Hyperlipidemia 06/14/2015    PCP: Dr. [name]    REFERRING PROVIDER: Dr. [name]   REFERRING DIAG: M54.2 (ICD-10-CM) - Neck pain  THERAPY DIAG:  Cervicalgia  Rationale for Evaluation and Treatment: Rehabilitation  ONSET DATE: 08/26/2018   SUBJECTIVE:                                                                                                                                                                                                          SUBJECTIVE STATEMENT: Pt reports feeling lethargic today and she thinks it is due to a recent adjustment in her blood pressure medications.   Hand dominance: Right  PERTINENT HISTORY:  Pt reports that she started to experience worsening neck pain over the past several years that starts in her cervical spine and radiates down bilateral shoulders. She also notes numbness, tingling, and pain that radiates down bilateral UE into thumbs and dorsal wrist. She describes needing to keep arms by her side to avoid exacerbating her pain. She reports having a difficulty time opening jars and difficulty laying flat on her back.   PAIN:  Are you having pain? Yes: NPRS scale: 5-6/10 Pain location: Cervical spine Pain description: Achy and sharp  Aggravating factors: Shoulder abduction and shoulders being exposed to cold air  Relieving factors: Side Sleeping, volatren gel    PRECAUTIONS: None  RED FLAGS: None     WEIGHT BEARING RESTRICTIONS: No  FALLS:  Has patient fallen in last 6 months? 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   NEXT MD VISIT: Scheduled to come back in a year   OBJECTIVE:   VITALS: BP 134/93  Sp02 98% HR 72   DIAGNOSTIC FINDINGS:  CLINICAL DATA:  Chronic neck pain   EXAM: CERVICAL SPINE - COMPLETE 4+ VIEW   COMPARISON:  None Available.   FINDINGS: Frontal, bilateral oblique, and lateral views of the cervical spine are obtained. There is straightening of the cervical spine from the C2 through C6 levels, with exaggerated kyphosis at the cervicothoracic junction. This  is likely degenerative in nature. There are no acute displaced fractures.    Moderate spondylosis at C5-6 and C6-7. Mild facet hypertrophy at the cervicothoracic junction. Prevertebral soft tissues are unremarkable. Evaluation of the neural foramina is limited by suboptimal positioning.   IMPRESSION: 1. Lower cervical degenerative changes, greatest at C5-6 and C6-7. 2. No acute fracture.     Electronically Signed   By: Sharlet Salina M.D.   On: 04/07/2023 06:57    PATIENT SURVEYS:  FOTO 58/100 with target 62   COGNITION: Overall cognitive status: Within functional limits for tasks assessed  SENSATION: Light touch: Numbness and tingling on palmer side of right hand   POSTURE: No Significant postural limitations  PALPATION: C7 TTP    CERVICAL ROM:   Active ROM A/PROM (deg) eval  Flexion 45*  Extension 45  Right lateral flexion 35  Left lateral flexion 45  Right rotation 45  Left rotation 60   (Blank rows = not tested)  UPPER EXTREMITY ROM:  Active ROM Right eval Left eval  Shoulder flexion 160 160  Shoulder extension    Shoulder abduction    Shoulder adduction    Shoulder extension    Shoulder internal rotation    Shoulder external rotation    Elbow flexion    Elbow extension    Wrist flexion    Wrist extension    Wrist ulnar deviation    Wrist radial deviation    Wrist pronation    Wrist supination     (Blank rows = not tested)  UPPER EXTREMITY MMT:  MMT Right eval Left eval  Shoulder flexion 4- 4-  Shoulder extension    Shoulder abduction 4 4  Shoulder adduction    Shoulder extension 4- 4-  Shoulder internal rotation    Shoulder external rotation    Middle trapezius NT  NT   Lower trapezius NT  NT   Elbow flexion    Elbow extension    Wrist flexion    Wrist extension    Wrist ulnar deviation    Wrist radial deviation    Wrist pronation    Wrist supination    Grip strength Poor  Good   (Blank rows = not tested)  CERVICAL SPECIAL TESTS:  Neck flexor muscle endurance test: NT  and Spurling's test:  Negative    TODAY'S TREATMENT:                                                                                                                              DATE:   05/26/23:   THEREX  UBE with seat at 10 with resistance at 3- 3 min forward and 3 min backward  OMEGA Scapular Rows #45 2 x 10  -Pt reports light headedness  Vitals  BP 121/81 HR 96 SpO2 98 Seated  Vitals BP 98/56 HR 103 SpO3 100 Standing (Symptomatic) OMEGA Scapular Rows #45 1 x 10  OMEGA Face Pulls #5 3 x 10  Shoulder MMT: -Flexion R/L 4+/4+ -Abduction R/L 4+/4+  -Pt report D2 PNF Flexion on RUE 1 x 10  -Pt reports increased right shoulder pain  Lower Trap Wall Sitting 1 x 10  -Pt reports some discomfort but less so than D2 PNF flexion.   05/21/23:  THEREX  UBE with seat at 10 with resistance at 3- 3 min forward and 3 min backward  OMEGA Rows #45 3 x 10 OMEGA Lat Pull Downs #35 3 x 10   OMEGA Face Pulls #5 1 x 10   Face Pulls yellow 2 x 10  Right cervical side bend 40 deg   MANUAL  Cervical distraction  Left upper trap stretch  Right side bend mobilizations grade III-IV   05/19/23: THEREX  UBE with seat at 10 with resistance at 3- 2.5 forward and 2.5 backward  OMEGA Seated Rows #45 3 x 10  OMEGA Lat Pull Down  #35 2 x 10  Seated Cervical Rotation SNAGS to right 2 x 10 with 2 sec hold  -Pt reports increased right shoulder pain  Supine  Seated Thoracic Extension with 3 sec hold 1 x 10    05/14/23: THEREX  UBE with seat at 10 with resistance at 3- 2.5 forward and 2.5 backward  OMEGA Seated Rows #40 3 x 10  OMEGA Lat Pull Downs #30 3 x 10  Bent Over Horizontal Abduction #1 DB with LUE 3 x 10  Standing Low Trap Setting 1 x 10  Standing Low Trap Setting at Wall with Resistance at Wall using Yellow band 2 x 10  FOTO: 59    PATIENT EDUCATION:  Education details: form and technique for correct performance of exercise  Person educated: Patient Education method: Explanation, Demonstration, Verbal  cues, and Handouts Education comprehension: verbalized understanding, returned demonstration, and verbal cues required  HOME EXERCISE PROGRAM: Access Code: [email or web address] URL: [email or web address] Date: 05/26/2023 Prepared by: Ellin Goodie  Exercises - Seated Cervical Retraction  - 3-4 x weekly - 3 sets - 10 reps - 2 sec  hold - Seated Cervical Sidebending Stretch  - 1 x daily - 3 reps - 30 sec  hold - Sternocleidomastoid Stretch  - 1 x daily - 3 reps - 30 sec  hold - Seated Assisted Cervical Rotation with Towel  - 1 x daily - 10 reps - 2 sec  hold - Seated Thoracic Lumbar Extension  - 1 x daily - 1 sets - 10 reps - 3 sec  hold - Low Trap Setting at Wall  - 3-4 x weekly - 3 sets - 10 reps - Seated Cervical Sidebending with Strap and Overpressure  - 1 x daily - 1 sets - 10 reps - 2 sec  hold ASSESSMENT:  CLINICAL IMPRESSION: Pt light headedness is due to orthostatic hypotension with a blood pressure drop from sitting to standing. She does demonstrate an improved with shoulder strength and with overhead periscapular strengthening with ability to perform lower trap strengthening exercises. She will continue to benefit from skilled PT interventions to address remaining strength, pain and radicular related deficits to return to PLOF and improve QoL.  OBJECTIVE IMPAIRMENTS: decreased ROM, hypomobility, impaired sensation, postural dysfunction, and pain.   ACTIVITY LIMITATIONS: carrying, lifting, sleeping, and reach over head  PARTICIPATION LIMITATIONS: cleaning, laundry, shopping, community activity, and yard work  PERSONAL FACTORS: Fitness, Time since onset of injury/illness/exacerbation, and 3+ comorbidities: Graves disease, HTN, Sleep Apnea   are also affecting patient's functional outcome.   REHAB POTENTIAL: Good  CLINICAL DECISION MAKING:  Stable/uncomplicated  EVALUATION COMPLEXITY: Low   GOALS: Goals reviewed with patient? No  SHORT TERM GOALS: Target date:  05/14/2023  Pt will be independent with HEP in order to improve strength and balance in order to decrease fall risk and improve function at home and work. Baseline: NT 05/07/23 Performing independently  Goal status: ACHIEVED     LONG TERM GOALS: Target date: 07/09/2023  Patient will have improved function and activity level as evidenced by an increase in FOTO score to target score.  Baseline: 58/100 with target of 62  05/14/23: 59 Goal status: ONGOING   2. Patient will improve right cervical lateral flexion AROM to be symmetrical left cervical flexion for improved cervical function and ability to better navigate environment while walking.  Baseline: Lateral Flex R/L 35 deg/45 deg 05/21/23: Cervical Lateral Flex R/L 40/45  Goal status: ONGOING   3.  Patient will improve shoulder and periscapular strength by at least 1/3 grade MMT (4- to 4) for improved cervical function and ability to maintain improved posture to relieve subacromial impingement symptoms.  Baseline: Shoulder Flex R/L 4-/4-, Shoulder Abd R/L 4/4 05/26/23: Shoulder Flex R/L 4+/4+, Shoulder Abd R/L 4+/4+  Goal status: ONGOING     PLAN:  PT FREQUENCY: 1-2x/week  PT DURATION: 10 weeks  PLANNED INTERVENTIONS: Therapeutic exercises, Neuromuscular re-education, Patient/Family education, Joint mobilization, Joint manipulation, Aquatic Therapy, Dry Needling, Electrical stimulation, Spinal manipulation, Spinal mobilization, Cryotherapy, Moist heat, Manual therapy, and Re-evaluation  PLAN FOR NEXT SESSION: Dry needling of rectus capitus. Cervical mobilizations grade III and IV. Rhomboid stretch and continue overhead strengthening   Ellin Goodie PT, DPT  [facility] [address] [place], [address] Phone: [phone]   Fax:  [phone]

## 2023-05-28 ENCOUNTER — Encounter: Payer: Self-pay | Admitting: Family Medicine

## 2023-05-28 ENCOUNTER — Ambulatory Visit: Admitting: Physical Therapy

## 2023-05-28 ENCOUNTER — Ambulatory Visit: Admitting: Family Medicine

## 2023-05-28 VITALS — BP 110/66 | HR 91 | Temp 98.0°F | Resp 16 | Ht 70.0 in | Wt 216.1 lb

## 2023-05-28 DIAGNOSIS — K219 Gastro-esophageal reflux disease without esophagitis: Secondary | ICD-10-CM | POA: Diagnosis not present

## 2023-05-28 DIAGNOSIS — R0789 Other chest pain: Secondary | ICD-10-CM | POA: Diagnosis not present

## 2023-05-28 DIAGNOSIS — I951 Orthostatic hypotension: Secondary | ICD-10-CM

## 2023-05-28 DIAGNOSIS — E039 Hypothyroidism, unspecified: Secondary | ICD-10-CM

## 2023-05-28 DIAGNOSIS — Z720 Tobacco use: Secondary | ICD-10-CM

## 2023-05-28 MED ORDER — ESOMEPRAZOLE MAGNESIUM 40 MG PO CPDR
40.0000 mg | DELAYED_RELEASE_CAPSULE | Freq: Every day | ORAL | 3 refills | Status: DC
Start: 2023-05-28 — End: 2024-01-28

## 2023-05-28 NOTE — Progress Notes (Signed)
SUBJECTIVE:   Chief Complaint  Patient presents with   Dizziness    When moving positions    HPI Presents to clinic for acute visit  Discussed the use of AI scribe software for clinical note transcription with the patient, who gave verbal consent to proceed.  History of Present Illness The patient, with a history of hypertension managed with amlodipine and Hyzaar, presents with worsening lightheadedness and shortness of breath over the past few days. They describe episodes of near syncope, particularly upon standing or bending over. These episodes are associated with headaches. The patient denies any chest pain but reports a vague discomfort in the chest region during these episodes.  The patient's blood pressure medication regimen was recently adjusted, with the addition of amlodipine to their existing Hyzaar therapy. They have been adherent to their medication regimen, although they did not take the amlodipine the night before the consultation due to the recent symptoms.  The patient reports no recent changes in other medications, diet, or weight. They have noticed a decrease in appetite and have been making an effort to increase water intake due to concerns about dehydration from excessive coffee consumption.  The patient also reports a history of Graves' disease and is currently under the care of an endocrinologist. They have been experiencing some blurred vision recently but have not had an eye examination. They continue to smoke almost a pack of cigarettes a day, a habit that has not changed recently.  The patient has been receiving physical therapy for neck issues for the past few weeks. They report feeling off-balance often, which has been more noticeable since starting physical therapy. They deny any recent illnesses or vaccinations.  The patient's symptoms and history suggest a possible issue with orthostatic hypotension, potentially exacerbated by their recent medication  changes, dehydration, or other underlying conditions. Further investigation is needed to confirm this and adjust their treatment plan accordingly.    PERTINENT PMH / PSH: As above  OBJECTIVE:  BP 110/66   Pulse 91   Temp 98 F (36.7 C)   Resp 16   Ht 5\' 10"  (1.778 m)   Wt 216 lb 2 oz (98 kg)   SpO2 97%   BMI 31.01 kg/m    Orthostatic Vitals for the past 48 hrs (Last 6 readings):  Patient Position Orthostatic BP Orthostatic Pulse  06/06/23 1534 Supine 125/80 81  06/06/23 1535 Sitting 133/83 82  06/06/23 1536 Standing 107/69 87     Physical Exam Vitals reviewed.  Constitutional:      General: She is not in acute distress.    Appearance: Normal appearance. She is normal weight. She is not ill-appearing, toxic-appearing or diaphoretic.  HENT:     Right Ear: Tympanic membrane, ear canal and external ear normal.     Left Ear: Tympanic membrane, ear canal and external ear normal.  Eyes:     General:        Right eye: No discharge.        Left eye: No discharge.     Conjunctiva/sclera: Conjunctivae normal.  Cardiovascular:     Rate and Rhythm: Normal rate and regular rhythm.     Heart sounds: Normal heart sounds.  Pulmonary:     Effort: Pulmonary effort is normal.     Breath sounds: Normal breath sounds.  Abdominal:     General: Bowel sounds are normal.  Musculoskeletal:        General: Normal range of motion.  Skin:    General: Skin  is warm and dry.  Neurological:     General: No focal deficit present.     Mental Status: She is alert and oriented to person, place, and time. Mental status is at baseline.  Psychiatric:        Mood and Affect: Mood normal.        Behavior: Behavior normal.        Thought Content: Thought content normal.        Judgment: Judgment normal.         05/30/2023    1:34 PM 05/28/2023    1:50 PM 04/15/2023   11:05 AM 04/01/2023    1:02 PM 01/28/2023    1:21 PM  Depression screen PHQ 2/9  Decreased Interest 0 0 0 1 2  Down, Depressed,  Hopeless 0 0 0 0 2  PHQ - 2 Score 0 0 0 1 4  Altered sleeping 2 2 2 3 3   Tired, decreased energy 2 2 2 3 2   Change in appetite 0  1 0 1  Feeling bad or failure about yourself  0 0 0 0 0  Trouble concentrating 0 0 0 0 0  Moving slowly or fidgety/restless 0 0 0 0 0  Suicidal thoughts 0 0 0 0 0  PHQ-9 Score 4 4 5 7 10   Difficult doing work/chores Not difficult at all Not difficult at all Not difficult at all Not difficult at all Not difficult at all      05/30/2023    1:34 PM 05/28/2023    1:51 PM 04/15/2023   11:05 AM 04/01/2023    1:02 PM  GAD 7 : Generalized Anxiety Score  Nervous, Anxious, on Edge 0 0 0 1  Control/stop worrying 0 0 0 0  Worry too much - different things 0 0 0 0  Trouble relaxing 0 0 0 0  Restless 0 0 0 0  Easily annoyed or irritable 0 0 0 0  Afraid - awful might happen 0 0 0 0  Total GAD 7 Score 0 0 0 1  Anxiety Difficulty Not difficult at all Somewhat difficult Not difficult at all Not difficult at all    ASSESSMENT/PLAN:  Orthostatic hypotension Assessment & Plan: Recent episodes of lightheadedness, near syncope, and headaches. Symptoms worsened over the past few days. Currently on Hyzaar and Amlodipine for hypertension. Amlodipine was added within the past year.Reports high coffee intake (1 pot/day) and recent increase in water intake. No recent weight loss or changes in appetite. -Stop both Hyzaar and Amlodipine for the next two days. -Monitor blood pressure at home. If BP >140/90, restart Amlodipine at night. -Increase water intake and monitor hydration status. -Return to clinic in two days (05/31/2023) for blood pressure recheck.  Orders: -     Comprehensive metabolic panel -     CBC -     EKG 12-Lead -     TSH  Gastro-esophageal reflux disease without esophagitis Assessment & Plan: Refill Nexium   Orders: -     Esomeprazole Magnesium; Take 1 capsule (40 mg total) by mouth daily.  Dispense: 90 capsule; Refill: 3  Acquired  hypothyroidism Assessment & Plan: History of Graves' disease. No recent changes in symptoms or medication. Follow-up with endocrinology scheduled in November. -Check thyroid function labs today.   Chest discomfort Assessment & Plan: Reports occasional chest discomfort and palpitations during episodes of lightheadedness. No history of cardiology consult in the electronic medical record. -Obtain EKG today. -Consider cardiology consult depending on results of EKG and  labs.   Tobacco use Assessment & Plan: Current smoker, approximately 1 pack/day. No recent changes in smoking habits. -Encourage smoking cessation.       PDMP reviewed  Return in about 2 days (around 05/30/2023).  [name], MD

## 2023-05-28 NOTE — Patient Instructions (Addendum)
It was a pleasure meeting you today. Thank you for allowing me to take part in your health care.  Our goals for today as we discussed include:  Stop Amlodipine and Hyzaar Monitor BP and if greater than 140/90 restart Amlodipine 5 mg at night Increase water intake Decrease caffeine intake Compression stockings daily  We will get some labs today.  If they are abnormal or we need to do something about them, I will call you.  If they are normal, I will send you a message on MyChart (if it is active) or a letter in the mail.  If you don't hear from [place] in 2 weeks, please call the office at the number below.   ECG today showed nonspecific finding.  Follow up in 2 days   If you have any questions or concerns, please do not hesitate to call the office at [phone].  I look forward to our next visit and until then take care and stay safe.  Regards,   [name], MD   [facility]

## 2023-05-29 LAB — CBC
HCT: 43.8 % (ref 36.0–46.0)
Hemoglobin: 14.7 g/dL (ref 12.0–15.0)
MCHC: 33.6 g/dL (ref 30.0–36.0)
MCV: 92.6 fL (ref 78.0–100.0)
Platelets: 329 10*3/uL (ref 150.0–400.0)
RBC: 4.73 Mil/uL (ref 3.87–5.11)
RDW: 13.7 % (ref 11.5–15.5)
WBC: 9 10*3/uL (ref 4.0–10.5)

## 2023-05-29 LAB — COMPREHENSIVE METABOLIC PANEL
ALT: 23 U/L (ref 0–35)
AST: 20 U/L (ref 0–37)
Albumin: 4.3 g/dL (ref 3.5–5.2)
Alkaline Phosphatase: 71 U/L (ref 39–117)
BUN: 14 mg/dL (ref 6–23)
CO2: 30 meq/L (ref 19–32)
Calcium: 10.1 mg/dL (ref 8.4–10.5)
Chloride: 104 meq/L (ref 96–112)
Creatinine, Ser: 1.09 mg/dL (ref 0.40–1.20)
GFR: 55.96 mL/min — ABNORMAL LOW (ref >60.00)
Glucose, Bld: 103 mg/dL — ABNORMAL HIGH (ref 70–99)
Potassium: 3.6 meq/L (ref 3.5–5.1)
Sodium: 142 meq/L (ref 135–145)
Total Bilirubin: 1.1 mg/dL (ref 0.2–1.2)
Total Protein: 6.8 g/dL (ref 6.0–8.3)

## 2023-05-29 LAB — TSH: TSH: 2.91 u[IU]/mL (ref 0.35–5.50)

## 2023-05-30 ENCOUNTER — Encounter: Payer: Self-pay | Admitting: Family Medicine

## 2023-05-30 ENCOUNTER — Ambulatory Visit
Admission: RE | Admit: 2023-05-30 | Discharge: 2023-05-30 | Disposition: A | Discharge status: Home / Self Care | Source: Ambulatory Visit | Attending: Family Medicine | Admitting: Family Medicine

## 2023-05-30 ENCOUNTER — Ambulatory Visit: Admitting: Family Medicine

## 2023-05-30 VITALS — BP 108/72 | HR 85 | Temp 97.9°F | Resp 16 | Ht 70.0 in | Wt 216.1 lb

## 2023-05-30 DIAGNOSIS — R519 Headache, unspecified: Secondary | ICD-10-CM | POA: Diagnosis not present

## 2023-05-30 DIAGNOSIS — Z720 Tobacco use: Secondary | ICD-10-CM | POA: Diagnosis not present

## 2023-05-30 DIAGNOSIS — L900 Lichen sclerosus et atrophicus: Secondary | ICD-10-CM | POA: Diagnosis not present

## 2023-05-30 DIAGNOSIS — I10 Essential (primary) hypertension: Secondary | ICD-10-CM | POA: Diagnosis not present

## 2023-05-30 MED ORDER — AMLODIPINE BESYLATE 2.5 MG PO TABS: 2.5000 mg | ORAL_TABLET | Freq: Every day | ORAL | 0 refills | Status: DC

## 2023-05-30 NOTE — Progress Notes (Signed)
SUBJECTIVE:   Chief Complaint  Patient presents with   Dizziness   HPI The patient, with a history of hypertension, presented for a follow-up visit. She reported experiencing intermittent headaches for approximately a week, which she described as different from her usual headaches and more like a pressure sensation. The headaches were not constant and did not wake her up at night. She also reported dizziness, particularly when standing up, and occasional blurry vision, mostly in the mornings. The patient also experienced a sharp chest pain that radiated across the chest, but it was not described as pressure-like or similar to a heart attack.  The patient noted that her blood pressure had been fluctuating, with readings as high as 164/83 and as low as 89/68. She had been monitoring her blood pressure at home and noticed that it tended to be higher in the evenings. She had recently stopped her blood pressure medications for two days, after which her blood pressure increased, prompting her to restart amlodipine.  In addition to these symptoms, the patient reported feeling weak in the knees, which was a new symptom for her. She also mentioned a recent episode of diarrhea that lasted for two weeks but had since resolved. She had a history of lichen sclerosus and recently experienced a tear in the affected area, which caused burning during urination and a foul smell. However, this issue had since resolved.  The patient had been trying to manage her hypertension by reducing her intake of caffeine and cigarettes. She had been drinking more water and less coffee, and she had cut down on smoking from a pack a day to slightly less. She had not yet started wearing compression stockings as previously advised.  The patient's recent lab results showed normal thyroid function and slightly decreased kidney function, but nothing significant. She had a known cyst in her left kidney. She was due to start a reduced  dose of amlodipine and was advised to continue monitoring her blood pressure at home.  PERTINENT PMH / PSH: As above  OBJECTIVE:  BP 108/72   Pulse 85   Temp 97.9 F (36.6 C)   Resp 16   Ht 5\' 10"  (1.778 m)   Wt 216 lb 2 oz (98 kg)   SpO2 96%   BMI 31.01 kg/m    Physical Exam Vitals reviewed.   MUSCULOSKELETAL: No tenderness in extremities, Full range of motion in all joints examined, No edema. NEUROLOGICAL: Extraocular movements intact, Pupils reactive to light, No nystagmus, Conjugate gaze maintained, No facial droop, Speech clear, Cranial nerves II through XII grossly intact, No involuntary movements, Strength 5/5 in all extremities, Sensation intact to light touch. SKIN: No rashes or lesions.     05/30/2023    1:34 PM 05/28/2023    1:50 PM 04/15/2023   11:05 AM 04/01/2023    1:02 PM 01/28/2023    1:21 PM  Depression screen PHQ 2/9  Decreased Interest 0 0 0 1 2  Down, Depressed, Hopeless 0 0 0 0 2  PHQ - 2 Score 0 0 0 1 4  Altered sleeping 2 2 2 3 3   Tired, decreased energy 2 2 2 3 2   Change in appetite 0  1 0 1  Feeling bad or failure about yourself  0 0 0 0 0  Trouble concentrating 0 0 0 0 0  Moving slowly or fidgety/restless 0 0 0 0 0  Suicidal thoughts 0 0 0 0 0  PHQ-9 Score 4 4 5 7  10  Difficult doing work/chores Not difficult at all Not difficult at all Not difficult at all Not difficult at all Not difficult at all      05/30/2023    1:34 PM 05/28/2023    1:51 PM 04/15/2023   11:05 AM 04/01/2023    1:02 PM  GAD 7 : Generalized Anxiety Score  Nervous, Anxious, on Edge 0 0 0 1  Control/stop worrying 0 0 0 0  Worry too much - different things 0 0 0 0  Trouble relaxing 0 0 0 0  Restless 0 0 0 0  Easily annoyed or irritable 0 0 0 0  Afraid - awful might happen 0 0 0 0  Total GAD 7 Score 0 0 0 1  Anxiety Difficulty Not difficult at all Somewhat difficult Not difficult at all Not difficult at all    ASSESSMENT/PLAN:  Essential (primary) hypertension Assessment &  Plan: Fluctuating blood pressure with recent increase to 164/83, causing symptoms of dizziness and headache. Patient self-administered amlodipine resulting in a drop in blood pressure. -Reduce amlodipine to 2.5mg  daily. -Continue to monitor blood pressure at home.  Orders: -     amLODIPine Besylate; Take 1 tablet (2.5 mg total) by mouth daily.  Dispense: 30 tablet; Refill: 0  Lichen sclerosus Assessment & Plan: Recent episode of itching and minor skin tear, now resolved. -No immediate action required. Continue current management.   Tobacco use Assessment & Plan: Smoking and Caffeine Intake Patient has recently reduced smoking and caffeine intake.   Intractable headache, unspecified chronicity pattern, unspecified headache type Assessment & Plan: Ongoing for approximately one week, associated with dizziness and fluctuating blood pressure. No nocturnal symptoms or waking from sleep. Neurological examination unremarkable. -Order CT head to investigate cause of headache. -Advise patient to present to the emergency department if headache worsens over the weekend.  Orders: -     CT HEAD WO CONTRAST ( ); Future   PDMP reviewed  Return if symptoms worsen or fail to improve, for PCP.  [name], MD

## 2023-05-30 NOTE — Patient Instructions (Addendum)
It was a pleasure meeting you today. Thank you for allowing me to take part in your health care.  Our goals for today as we discussed include:  Decrease amlodipine to 2.5 mg at night Continue to hold Hyzaar for now Continue to monitor blood pressure.  Goal <150/90  Have placed order for CT of head for persistent headache x 1 week.     If you have any questions or concerns, please do not hesitate to call the office at [phone].  I look forward to our next visit and until then take care and stay safe.  Regards,   [name], MD   [facility]

## 2023-05-30 NOTE — Assessment & Plan Note (Signed)
Recent episode of itching and minor skin tear, now resolved. -No immediate action required. Continue current management.

## 2023-05-30 NOTE — Assessment & Plan Note (Signed)
Smoking and Caffeine Intake Patient has recently reduced smoking and caffeine intake.

## 2023-05-30 NOTE — Assessment & Plan Note (Signed)
Fluctuating blood pressure with recent increase to 164/83, causing symptoms of dizziness and headache. Patient self-administered amlodipine resulting in a drop in blood pressure. -Reduce amlodipine to 2.5mg  daily. -Continue to monitor blood pressure at home.

## 2023-05-30 NOTE — Assessment & Plan Note (Signed)
Ongoing for approximately one week, associated with dizziness and fluctuating blood pressure. No nocturnal symptoms or waking from sleep. Neurological examination unremarkable. -Order CT head to investigate cause of headache. -Advise patient to present to the emergency department if headache worsens over the weekend.

## 2023-06-01 ENCOUNTER — Encounter: Payer: Self-pay | Admitting: Family Medicine

## 2023-06-02 ENCOUNTER — Ambulatory Visit: Admitting: Physical Therapy

## 2023-06-04 ENCOUNTER — Encounter: Admitting: Physical Therapy

## 2023-06-06 ENCOUNTER — Encounter: Payer: Self-pay | Admitting: Family Medicine

## 2023-06-06 DIAGNOSIS — I951 Orthostatic hypotension: Secondary | ICD-10-CM | POA: Insufficient documentation

## 2023-06-06 DIAGNOSIS — R0789 Other chest pain: Secondary | ICD-10-CM | POA: Insufficient documentation

## 2023-06-06 NOTE — Assessment & Plan Note (Signed)
Current smoker, approximately 1 pack/day. No recent changes in smoking habits. -Encourage smoking cessation.

## 2023-06-06 NOTE — Assessment & Plan Note (Signed)
Reports occasional chest discomfort and palpitations during episodes of lightheadedness. No history of cardiology consult in the electronic medical record. -Obtain EKG today. -Consider cardiology consult depending on results of EKG and labs.

## 2023-06-06 NOTE — Assessment & Plan Note (Addendum)
Recent episodes of lightheadedness, near syncope, and headaches. Symptoms worsened over the past few days. Currently on Hyzaar and Amlodipine for hypertension. Amlodipine was added within the past year.Reports high coffee intake (1 pot/day) and recent increase in water intake. No recent weight loss or changes in appetite. -Stop both Hyzaar and Amlodipine for the next two days. -Monitor blood pressure at home. If BP >140/90, restart Amlodipine at night. -Increase water intake and monitor hydration status. -Return to clinic in two days (05/31/2023) for blood pressure recheck.

## 2023-06-06 NOTE — Assessment & Plan Note (Signed)
Refill Nexium.

## 2023-06-06 NOTE — Assessment & Plan Note (Signed)
History of Graves' disease. No recent changes in symptoms or medication. Follow-up with endocrinology scheduled in November. -Check thyroid function labs today.

## 2023-06-09 ENCOUNTER — Encounter: Admitting: Physical Therapy

## 2023-06-11 ENCOUNTER — Encounter: Admitting: Physical Therapy

## 2023-06-12 ENCOUNTER — Ambulatory Visit: Admitting: Obstetrics and Gynecology

## 2023-06-12 ENCOUNTER — Encounter: Payer: Self-pay | Admitting: Obstetrics and Gynecology

## 2023-06-12 VITALS — BP 145/79 | HR 65 | Ht 70.0 in | Wt 219.0 lb

## 2023-06-12 DIAGNOSIS — Z1231 Encounter for screening mammogram for malignant neoplasm of breast: Secondary | ICD-10-CM

## 2023-06-12 DIAGNOSIS — N644 Mastodynia: Secondary | ICD-10-CM | POA: Diagnosis not present

## 2023-06-12 NOTE — Progress Notes (Signed)
Emma Allan, MD   Chief Complaint  Patient presents with   Breast Exam    Pain and felt something rod shape in RB x 4 days    HPI:      [name] is a 57 y.o. [phone] whose LMP was No LMP recorded. Patient has had a hysterectomy., presents today for RT breast pain for the past 4 days. Becoming more frequent; pain with movement, not just palpation. No meds to treat. Has noticed "rod shaped" area where pain is; felt better in upright position. No trauma, erythema, nipple d/c. S/p RT breast bx 2014. Drinks 32 oz caffeine daily, cutting back due to orthostatic HTN.   Neg mammo 5/23 There is no FH of breast cancer. There is a FH of ovarian cancer in her mat aunt and pancreatic cancer in her mother. Pt is MyRisk neg 5/23; IBIS=6.0%/riskscore=2.8% The patient does occas self-breast exams.    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/06/2023   Chest discomfort 06/06/2023   Headache 05/30/2023   Tobacco use 05/02/2023   Mood disorder (HCC) 02/02/2023   Need for hepatitis C screening test 02/02/2023   Encounter for screening for HIV 02/02/2023   Colon cancer screening 02/02/2023   Breast cancer screening by mammogram 02/02/2023   Class 1 obesity 02/02/2023   Chronic neck pain 02/02/2023   Family history of pancreatic cancer 12/19/2021   Mixed stress and urge urinary incontinence 07/31/2018   Lichen sclerosus 07/01/2017   Anxiety and depression 10/11/2015   Gastro-esophageal reflux disease without esophagitis 07/12/2015   Acquired hypothyroidism 06/14/2015   Essential (primary) hypertension 06/14/2015   Female climacteric state 06/14/2015   Hyperlipidemia 06/14/2015    Past Surgical History:  Procedure Laterality Date   ABDOMINAL HYSTERECTOMY     APPENDECTOMY     BREAST EXCISIONAL BIOPSY Right    2014   CATARACT EXTRACTION W/PHACO Right 03/09/2018   Procedure: CATARACT EXTRACTION PHACO AND INTRAOCULAR LENS PLACEMENT (IOC)  RIGHT IVA TOPICAL SYMFONY LENS;  Surgeon: Nevada Crane, MD;  Location: [facility];  Service: Ophthalmology;  Laterality: Right;   CATARACT EXTRACTION W/PHACO Left 03/30/2018   Procedure: CATARACT EXTRACTION PHACO AND INTRAOCULAR LENS PLACEMENT (IOC) SYMFONY LENS LEFT;  Surgeon: Nevada Crane, MD;  Location: [facility];  Service: Ophthalmology;  Laterality: Left;   CHOLECYSTECTOMY     IMAGE GUIDED SINUS SURGERY Right 05/16/2015   Procedure: IMAGE GUIDED SINUS SURGERY, RIGHT ENDOSCOPIC MAXILLARY ANTROSTOMY, RIGHT ENDOSCOPIC ANTERIOR ETHMOIDECTOMY AND RIGHT ENDOSCOPIC FRONTAL RECESS EXPLORATION;  Surgeon: Geanie Logan, MD;  Location: [facility];  Service: ENT;  Laterality: Right;  GAVE DISK TO CE CE   POLYPECTOMY      Family History  Problem Relation Age of Onset   Pancreatic cancer Mother 23   Stroke Mother    Stroke Father    Cancer Brother        Cancer on Back   Lung cancer Brother        spine   Ovarian cancer Maternal Aunt 60   Hematuria Daughter    Breast cancer Neg Hx     Social History   Socioeconomic History   Marital status: Married    Spouse name: Not on file   Number of children: Not on file   Years of education: Not on file   Highest education level: Associate degree: academic program  Occupational History   Not on file  Tobacco Use  Smoking status: Every Day    Current packs/day: 0.00    Average packs/day: 0.8 packs/day for 25.0 years (18.8 ttl pk-yrs)    Types: Cigarettes    Start date: 12/08/1989    Last attempt to quit: 12/09/2014    Years since quitting: 8.5   Smokeless tobacco: Never  Vaping Use   Vaping status: Never Used  Substance and Sexual Activity   Alcohol use: No   Drug use: No   Sexual activity: Yes    Birth control/protection: Surgical    Comment: Hysterectomy  Other Topics Concern   Not on file  Social History Narrative   Not on file   Social Determinants of Health   Financial Resource Strain:  Low Risk  (03/31/2023)   Overall Financial Resource Strain (CARDIA)    Difficulty of Paying Living Expenses: Not very hard  Food Insecurity: No Food Insecurity (03/31/2023)   Hunger Vital Sign    Worried About Running Out of Food in the Last Year: Never true    Ran Out of Food in the Last Year: Never true  Transportation Needs: No Transportation Needs (03/31/2023)   PRAPARE - Administrator, Civil Service (Medical): No    Lack of Transportation (Non-Medical): No  Physical Activity: Unknown (03/31/2023)   Exercise Vital Sign    Days of Exercise per Week: 0 days    Minutes of Exercise per Session: Not on file  Stress: No Stress Concern Present (03/31/2023)   [employer] of Occupational Health - Occupational Stress Questionnaire    Feeling of Stress : Only a little  Social Connections: Moderately Isolated (03/31/2023)   Social Connection and Isolation Panel [NHANES]    Frequency of Communication with Friends and Family: Three times a week    Frequency of Social Gatherings with Friends and Family: Once a week    Attends Religious Services: Never    Database administrator or Organizations: No    Attends Engineer, structural: Not on file    Marital Status: Married  Catering manager Violence: Not on file    Outpatient Medications Prior to Visit  Medication Sig Dispense Refill   Adapalene (DIFFERIN) 0.3 % gel Apply 1 Application topically at bedtime. Pea size amount to face nightly as tolerated 135 g 4   amLODipine (NORVASC) 2.5 MG tablet Take 1 tablet (2.5 mg total) by mouth daily. 30 tablet 0   aspirin EC 81 MG tablet Take by mouth.     azelastine (ASTELIN) 0.1 % nasal spray Place into both nostrils.     cholecalciferol (VITAMIN D3) 25 MCG (1000 UNIT) tablet Take 1,000 Units by mouth daily.     clobetasol ointment (TEMOVATE) 0.05 % Apply to affected once weekly as maintenance 45 g 0   esomeprazole (NEXIUM) 40 MG capsule Take 1 capsule (40 mg total) by mouth daily. 90  capsule 3   fluticasone (FLONASE) 50 MCG/ACT nasal spray Place 1-2 sprays into both nostrils daily.     Magnesium 250 MG TABS Take by mouth daily.     nitroGLYCERIN (NITROSTAT) 0.4 MG SL tablet nitroglycerin 0.4 mg sublingual tablet     PARoxetine (PAXIL) 20 MG tablet Take 1 tablet (20 mg total) by mouth daily. 90 tablet 3   simvastatin (ZOCOR) 20 MG tablet Take 1 tablet (20 mg total) by mouth at bedtime. 90 tablet 3   losartan-hydrochlorothiazide (HYZAAR) 100-12.5 MG tablet Take 1 tablet by mouth daily. (Patient not taking: Reported on 05/30/2023) 90 tablet 3  No facility-administered medications prior to visit.      ROS:  Review of Systems  Constitutional:  Negative for fever.  Gastrointestinal:  Negative for blood in stool, constipation, diarrhea, nausea and vomiting.  Genitourinary:  Negative for dyspareunia, dysuria, flank pain, frequency, hematuria, urgency, vaginal bleeding, vaginal discharge and vaginal pain.  Musculoskeletal:  Negative for back pain.  Skin:  Negative for rash.   BREAST: pain/mass   OBJECTIVE:   Vitals:  BP (!) 145/79   Pulse 65   Ht 5\' 10"  (1.778 m)   Wt 219 lb (99.3 kg)   BMI 31.42 kg/m   Physical Exam Vitals reviewed.  Pulmonary:     Effort: Pulmonary effort is normal.  Chest:  Breasts:    Breasts are symmetrical.     Right: Tenderness present. No inverted nipple, mass, nipple discharge or skin change.     Left: No inverted nipple, mass, nipple discharge, skin change or tenderness.    Musculoskeletal:        General: Normal range of motion.     Cervical back: Normal range of motion.  Skin:    General: Skin is warm and dry.  Neurological:     General: No focal deficit present.     Mental Status: She is alert and oriented to person, place, and time.     Cranial Nerves: No cranial nerve deficit.  Psychiatric:        Mood and Affect: Mood normal.        Behavior: Behavior normal.        Thought Content: Thought content normal.         Judgment: Judgment normal.     Assessment/Plan: Encounter for screening mammogram for malignant neoplasm of breast - Plan: [place] LIMITED ULTRASOUND INCLUDING AXILLA RIGHT BREAST, [place] LIMITED ULTRASOUND INCLUDING AXILLA LEFT BREAST , MM 3D DIAGNOSTIC MAMMOGRAM BILATERAL BREAST  Breast pain, right - Plan: [place] LIMITED ULTRASOUND INCLUDING AXILLA RIGHT BREAST, [place] LIMITED ULTRASOUND INCLUDING AXILLA LEFT BREAST , MM 3D DIAGNOSTIC MAMMOGRAM BILATERAL BREAST; 10:00 position RT breast; no discrete mass. Pt to schedule dx mammo and u/s, will f/u with results. D/C caffeine/NSAIDs/heating pad.     Return if symptoms worsen or fail to improve.  Emma Klugh B. Mycah Mcdougall, PA-C 06/12/2023 4:49 PM

## 2023-06-12 NOTE — Patient Instructions (Addendum)
I value your feedback and you entrusting us with your care. If you get a [facility] patient survey, I would appreciate you taking the time to let us know about your experience today. Thank you!  [facility] ([place])--[phone]  

## 2023-06-13 ENCOUNTER — Ambulatory Visit
Admission: RE | Admit: 2023-06-13 | Discharge: 2023-06-13 | Disposition: A | Discharge status: Home / Self Care | Source: Ambulatory Visit | Attending: Obstetrics and Gynecology

## 2023-06-13 DIAGNOSIS — Z1231 Encounter for screening mammogram for malignant neoplasm of breast: Secondary | ICD-10-CM | POA: Insufficient documentation

## 2023-06-13 DIAGNOSIS — N644 Mastodynia: Secondary | ICD-10-CM

## 2023-06-16 ENCOUNTER — Encounter: Admitting: Physical Therapy

## 2023-06-18 ENCOUNTER — Encounter: Admitting: Physical Therapy

## 2023-06-23 ENCOUNTER — Encounter: Admitting: Physical Therapy

## 2023-06-25 ENCOUNTER — Encounter: Admitting: Physical Therapy

## 2023-07-03 ENCOUNTER — Encounter: Payer: Self-pay | Admitting: Family Medicine

## 2023-07-06 ENCOUNTER — Other Ambulatory Visit: Payer: Self-pay | Admitting: Family Medicine

## 2023-07-06 DIAGNOSIS — Z720 Tobacco use: Secondary | ICD-10-CM

## 2023-07-14 ENCOUNTER — Other Ambulatory Visit: Payer: Self-pay | Admitting: Family Medicine

## 2023-07-14 DIAGNOSIS — Z122 Encounter for screening for malignant neoplasm of respiratory organs: Secondary | ICD-10-CM

## 2023-08-13 ENCOUNTER — Telehealth: Payer: Self-pay

## 2023-08-13 ENCOUNTER — Ambulatory Visit: Admitting: Family Medicine

## 2023-08-13 ENCOUNTER — Encounter: Payer: Self-pay | Admitting: Family Medicine

## 2023-08-13 VITALS — BP 138/84 | HR 88 | Temp 98.5°F | Ht 70.0 in | Wt 215.0 lb

## 2023-08-13 DIAGNOSIS — E039 Hypothyroidism, unspecified: Secondary | ICD-10-CM

## 2023-08-13 DIAGNOSIS — K219 Gastro-esophageal reflux disease without esophagitis: Secondary | ICD-10-CM | POA: Diagnosis not present

## 2023-08-13 DIAGNOSIS — R079 Chest pain, unspecified: Secondary | ICD-10-CM

## 2023-08-13 DIAGNOSIS — R61 Generalized hyperhidrosis: Secondary | ICD-10-CM

## 2023-08-13 DIAGNOSIS — I10 Essential (primary) hypertension: Secondary | ICD-10-CM

## 2023-08-13 DIAGNOSIS — R0789 Other chest pain: Secondary | ICD-10-CM | POA: Diagnosis not present

## 2023-08-13 MED ORDER — AMLODIPINE BESYLATE 2.5 MG PO TABS
2.5000 mg | ORAL_TABLET | Freq: Every day | ORAL | 0 refills | Status: DC
Start: 2023-08-13 — End: 2023-08-14

## 2023-08-13 MED ORDER — INDAPAMIDE 1.25 MG PO TABS
1.2500 mg | ORAL_TABLET | Freq: Every day | ORAL | 0 refills | Status: DC
Start: 2023-08-13 — End: 2023-08-14

## 2023-08-13 NOTE — Telephone Encounter (Signed)
Copied from CRM [id number]. Topic: Clinical - Prescription Issue >> Aug 13, 2023  4:02 PM Emma Barker R wrote: Reason for CRM: Patient stated that her Rxs amLODipine (NORVASC) 2.5 MG tablet and indapamide (LOZOL) 1.25 MG tablet were sent to her [facility] and its going to take a week. She is asking for the scripts to be sent to her [facility] on file so she can start taking her meds right away. CB# [phone]

## 2023-08-13 NOTE — Patient Instructions (Addendum)
It was a pleasure meeting you today. Thank you for allowing me to take part in your health care.  Our goals for today as we discussed include:  Start Indapmide 1.25 mg in the morning Restart Amlodipine 2.5 mg at night Monitor blood pressure at home.  Goal <150/90  We will get some labs today.  If they are abnormal or we need to do something about them, I will call you.  If they are normal, I will send you a message on MyChart (if it is active) or a letter in the mail.  If you don't hear from [place] in 2 weeks, please call the office at the number below.     This is a list of the screening recommended for you and due dates:  Health Maintenance  Topic Date Due   COVID-19 Vaccine (1) Never done   Zoster (Shingles) Vaccine (2 of 2) 01/13/2017   DTaP/Tdap/Td vaccine (2 - Td or Tdap) 11/19/2020   Flu Shot  11/24/2023*   Pap with HPV screening  12/16/2024   Mammogram  06/12/2025   Colon Cancer Screening  12/17/2028   Hepatitis C Screening  Completed   HIV Screening  Completed   HPV Vaccine  Aged Out  *Topic was postponed. The date shown is not the original due date.     If you have any questions or concerns, please do not hesitate to call the office at [phone].  I look forward to our next visit and until then take care and stay safe.  Regards,   [name], MD   [facility]

## 2023-08-13 NOTE — Progress Notes (Signed)
SUBJECTIVE:   Chief Complaint  Patient presents with   Hypertension    C/O headache, occasional dizziness. Experienced chest pain yesterday    HPI Presents for acute visit  Discussed the use of AI scribe software for clinical note transcription with the patient, who gave verbal consent to proceed.  History of Present Illness The patient, with a history of hypertension and Graves' disease, presented with concerns about fluctuating blood pressure and new symptoms of headaches and hot flashes. The patient reported self-adjusting their amlodipine dosage from 2.5mg  to 5mg  after running out of the prescribed medication, which was initially well-tolerated. However, they noticed a subsequent increase in blood pressure, prompting them to reintroduce losartan 100mg  into their regimen. Despite this, the patient reported persistently high blood pressure readings, particularly in the evenings, and an increase in frequency of headaches and hot flashes.  The patient also reported a history of thyroid nodules and Graves' disease, managed by an endocrinologist. They expressed dissatisfaction with their current endocrinologist and are considering switching providers. The patient noted that their T4 levels were at the lower end of the normal range, while TSH and T3 levels were within normal limits. They also reported difficulty swallowing and nocturnal regurgitation, which they attributed to possible GERD.  The patient also mentioned a history of diastolic dysfunction and tricuspid valve disease, which were identified on a previous echocardiogram. They reported a sensation of heart "thumping" at night, similar to symptoms experienced prior to their Graves' disease diagnosis. The patient also reported a cyst in their left kidney, which has been stable in size.  The patient expressed concern about their blood pressure fluctuations, particularly the high readings, and the new onset of headaches and hot flashes.  They reported an episode of feeling unsteady on their feet, describing it as walking "like I was drunk." The patient also reported an increase in hot flashes, which they described as feeling "like I'm freaking just starting menopause." They denied any fever associated with these episodes.    PERTINENT PMH / PSH: As above  OBJECTIVE:  BP 138/84   Pulse 88   Temp 98.5 F (36.9 C) (Oral)   Ht 5\' 10"  (1.778 m)   Wt 215 lb (97.5 kg)   SpO2 97%   BMI 30.85 kg/m     Physical Exam Vitals reviewed.  Constitutional:      General: She is not in acute distress.    Appearance: Normal appearance. She is obese. She is not ill-appearing, toxic-appearing or diaphoretic.  Eyes:     General:        Right eye: No discharge.        Left eye: No discharge.     Conjunctiva/sclera: Conjunctivae normal.  Cardiovascular:     Rate and Rhythm: Normal rate and regular rhythm.     Heart sounds: Normal heart sounds.  Pulmonary:     Effort: Pulmonary effort is normal.     Breath sounds: Normal breath sounds.  Abdominal:     General: Bowel sounds are normal.  Musculoskeletal:        General: Normal range of motion.  Skin:    General: Skin is warm and dry.  Neurological:     General: No focal deficit present.     Mental Status: She is alert and oriented to person, place, and time. Mental status is at baseline.  Psychiatric:        Mood and Affect: Mood normal.        Behavior: Behavior normal.  Thought Content: Thought content normal.        Judgment: Judgment normal.        05/30/2023    1:34 PM 05/28/2023    1:50 PM 04/15/2023   11:05 AM 04/01/2023    1:02 PM 01/28/2023    1:21 PM  Depression screen PHQ 2/9  Decreased Interest 0 0 0 1 2  Down, Depressed, Hopeless 0 0 0 0 2  PHQ - 2 Score 0 0 0 1 4  Altered sleeping 2 2 2 3 3   Tired, decreased energy 2 2 2 3 2   Change in appetite 0  1 0 1  Feeling bad or failure about yourself  0 0 0 0 0  Trouble concentrating 0 0 0 0 0  Moving  slowly or fidgety/restless 0 0 0 0 0  Suicidal thoughts 0 0 0 0 0  PHQ-9 Score 4 4 5 7 10   Difficult doing work/chores Not difficult at all Not difficult at all Not difficult at all Not difficult at all Not difficult at all      05/30/2023    1:34 PM 05/28/2023    1:51 PM 04/15/2023   11:05 AM 04/01/2023    1:02 PM  GAD 7 : Generalized Anxiety Score  Nervous, Anxious, on Edge 0 0 0 1  Control/stop worrying 0 0 0 0  Worry too much - different things 0 0 0 0  Trouble relaxing 0 0 0 0  Restless 0 0 0 0  Easily annoyed or irritable 0 0 0 0  Afraid - awful might happen 0 0 0 0  Total GAD 7 Score 0 0 0 1  Anxiety Difficulty Not difficult at all Somewhat difficult Not difficult at all Not difficult at all    ASSESSMENT/PLAN:  Essential (primary) hypertension Assessment & Plan: Uncontrolled blood pressure with recent medication changes. Patient self-adjusted amlodipine dose from 2.5mg  to 5mg , then switched to losartan 100mg , leading to fluctuating blood pressure readings.  -Resume amlodipine 2.5mg  at night. -Start indapamide in the morning. -Check kidney function today. -Check home blood pressure readings over the next few days and report back.  Orders: -     Comprehensive metabolic panel  Diaphoresis Assessment & Plan: Increasing headaches and flushing/hot flashes Fluctuating blood pressure Low suspicion for pheochromocytoma but will check urine metanephrine/catecholamine  Orders: -     CBC with Differential/Platelet -     Catecholamines, fractionated, urine, 24 hour; Future -     Metanephrines, urine, 24 hour; Future  Acquired hypothyroidism Assessment & Plan: Patient reports symptoms of palpitations and hot flashes, which could be related to thyroid function. Recent TSH was normal, but patient reports history of fluctuating T4 levels. -Check thyroid function today.   Gastro-esophageal reflux disease without esophagitis Assessment & Plan: Patient reports increased heartburn  and episodes of waking up with a mouth full of water, suggesting possible GERD exacerbation. -Continue Nexium as currently prescribed.   Atypical chest pain Assessment & Plan: 1 episode of chest pain lasting few seconds and self resolved. No current chest pain today EKG: normal EKG, normal sinus rhythm, unchanged from previous tracings.   Orders: -     EKG 12-Lead    PDMP reviewed  Return in about 2 weeks (around 08/27/2023) for PCP, HTN.  [name], MD

## 2023-08-14 ENCOUNTER — Other Ambulatory Visit: Payer: Self-pay | Admitting: Family Medicine

## 2023-08-14 ENCOUNTER — Encounter: Payer: Self-pay | Admitting: Family Medicine

## 2023-08-14 ENCOUNTER — Other Ambulatory Visit: Payer: Self-pay

## 2023-08-14 DIAGNOSIS — I10 Essential (primary) hypertension: Secondary | ICD-10-CM

## 2023-08-14 DIAGNOSIS — E876 Hypokalemia: Secondary | ICD-10-CM

## 2023-08-14 LAB — CBC WITH DIFFERENTIAL/PLATELET
Basophils Absolute: 0.1 10*3/uL (ref 0.0–0.1)
Basophils Relative: 1.1 % (ref 0.0–3.0)
Eosinophils Absolute: 0.2 10*3/uL (ref 0.0–0.7)
Eosinophils Relative: 1.8 % (ref 0.0–5.0)
HCT: 45.6 % (ref 36.0–46.0)
Hemoglobin: 15.1 g/dL — ABNORMAL HIGH (ref 12.0–15.0)
Lymphocytes Relative: 27.8 % (ref 12.0–46.0)
Lymphs Abs: 2.6 10*3/uL (ref 0.7–4.0)
MCHC: 33.2 g/dL (ref 30.0–36.0)
MCV: 93.3 fL (ref 78.0–100.0)
Monocytes Absolute: 0.8 10*3/uL (ref 0.1–1.0)
Monocytes Relative: 8.2 % (ref 3.0–12.0)
Neutro Abs: 5.6 10*3/uL (ref 1.4–7.7)
Neutrophils Relative %: 61.1 % (ref 43.0–77.0)
Platelets: 336 10*3/uL (ref 150.0–400.0)
RBC: 4.88 Mil/uL (ref 3.87–5.11)
RDW: 13.6 % (ref 11.5–15.5)
WBC: 9.2 10*3/uL (ref 4.0–10.5)

## 2023-08-14 LAB — COMPREHENSIVE METABOLIC PANEL
ALT: 19 U/L (ref 0–35)
AST: 18 U/L (ref 0–37)
Albumin: 4.4 g/dL (ref 3.5–5.2)
Alkaline Phosphatase: 73 U/L (ref 39–117)
BUN: 10 mg/dL (ref 6–23)
CO2: 30 meq/L (ref 19–32)
Calcium: 9.6 mg/dL (ref 8.4–10.5)
Chloride: 103 meq/L (ref 96–112)
Creatinine, Ser: 0.97 mg/dL (ref 0.40–1.20)
GFR: 64.27 mL/min (ref >60.00)
Glucose, Bld: 70 mg/dL (ref 70–99)
Potassium: 3.4 meq/L — ABNORMAL LOW (ref 3.5–5.1)
Sodium: 142 meq/L (ref 135–145)
Total Bilirubin: 1.1 mg/dL (ref 0.2–1.2)
Total Protein: 7.3 g/dL (ref 6.0–8.3)

## 2023-08-14 MED ORDER — INDAPAMIDE 1.25 MG PO TABS
1.2500 mg | ORAL_TABLET | Freq: Every day | ORAL | 0 refills | Status: DC
Start: 2023-08-14 — End: 2023-09-02

## 2023-08-14 MED ORDER — AMLODIPINE BESYLATE 2.5 MG PO TABS
2.5000 mg | ORAL_TABLET | Freq: Every day | ORAL | 0 refills | Status: DC
Start: 2023-08-14 — End: 2023-08-14

## 2023-08-14 MED ORDER — INDAPAMIDE 1.25 MG PO TABS
1.2500 mg | ORAL_TABLET | Freq: Every day | ORAL | 0 refills | Status: DC
Start: 2023-08-14 — End: 2023-08-14

## 2023-08-14 MED ORDER — AMLODIPINE BESYLATE 2.5 MG PO TABS: 2.5000 mg | ORAL_TABLET | Freq: Every day | ORAL | 0 refills | Status: DC

## 2023-08-14 MED ORDER — INDAPAMIDE 1.25 MG PO TABS: 1.2500 mg | ORAL_TABLET | Freq: Every day | ORAL | 0 refills | Status: DC

## 2023-08-14 MED ORDER — POTASSIUM CHLORIDE CRYS ER 20 MEQ PO TBCR
20.0000 meq | EXTENDED_RELEASE_TABLET | Freq: Every day | ORAL | 0 refills | Status: DC
Start: 2023-08-14 — End: 2023-09-11

## 2023-08-14 NOTE — Telephone Encounter (Signed)
Called [facility] to see if I could cancel the order, and they stated that it has already shipped. The rep stated that you could send a 7 day supply in and insurance will cover it.

## 2023-08-14 NOTE — Telephone Encounter (Signed)
RX request sent to [facility] on [address]

## 2023-08-18 NOTE — Telephone Encounter (Signed)
Pt.notified

## 2023-08-20 DIAGNOSIS — R0789 Other chest pain: Secondary | ICD-10-CM | POA: Insufficient documentation

## 2023-08-20 DIAGNOSIS — R61 Generalized hyperhidrosis: Secondary | ICD-10-CM | POA: Insufficient documentation

## 2023-08-20 NOTE — Assessment & Plan Note (Signed)
Patient reports increased heartburn and episodes of waking up with a mouth full of water, suggesting possible GERD exacerbation. -Continue Nexium as currently prescribed.

## 2023-08-20 NOTE — Assessment & Plan Note (Signed)
Patient reports symptoms of palpitations and hot flashes, which could be related to thyroid function. Recent TSH was normal, but patient reports history of fluctuating T4 levels. -Check thyroid function today.

## 2023-08-20 NOTE — Assessment & Plan Note (Signed)
Increasing headaches and flushing/hot flashes Fluctuating blood pressure Low suspicion for pheochromocytoma but will check urine metanephrine/catecholamine

## 2023-08-20 NOTE — Assessment & Plan Note (Signed)
1 episode of chest pain lasting few seconds and self resolved. No current chest pain today EKG: normal EKG, normal sinus rhythm, unchanged from previous tracings.

## 2023-08-20 NOTE — Assessment & Plan Note (Addendum)
Uncontrolled blood pressure with recent medication changes. Patient self-adjusted amlodipine dose from 2.5mg  to 5mg , then switched to losartan 100mg , leading to fluctuating blood pressure readings.  -Resume amlodipine 2.5mg  at night. -Start indapamide in the morning. -Check kidney function today. -Check home blood pressure readings over the next few days and report back.

## 2023-08-23 LAB — METANEPHRINES, URINE, 24 HOUR
METANEPHRINE: 59 ug/(24.h) — ABNORMAL LOW (ref 90–315)
METANEPHRINES, TOTAL: 495 ug/(24.h) (ref 224–832)
NORMETANEPHRINE: 436 ug/(24.h) (ref 122–676)
Total Volume: 1000 mL

## 2023-08-23 LAB — CATECHOLAMINES, FRACTIONATED, URINE, 24 HOUR
Creatinine, Urine mg/day-CATEUR: 1.46 g/(24.h) (ref 0.50–2.15)
Epinephrine, 24H, Ur: 1.46 g/(24.h) (ref 0.50–2.15)
Total Volume: 1000 mL

## 2023-08-24 ENCOUNTER — Encounter: Payer: Self-pay | Admitting: Family Medicine

## 2023-08-28 ENCOUNTER — Ambulatory Visit: Admitting: Family Medicine

## 2023-09-01 ENCOUNTER — Telehealth: Payer: Self-pay | Admitting: Family Medicine

## 2023-09-01 NOTE — Telephone Encounter (Signed)
 Prescription Request  09/01/2023  LOV: 08/13/2023 PATIENT WAS SUPPOSE TO SEE [facility] ON 1/7/PROVIDER O [facility]  What is the name of the medication or equipment? amLODipine  (NORVASC ) 2.5 MG tablet AND  indapamide  (LOZOL ) 1.25 MG tablet.   Have you contacted your pharmacy to request a refill? No   Which pharmacy would you like this sent to?   [id number] - [place], [place] - [address] AT [facility] OF [address] [facility] [address] [place] [address] Phone: [phone] Fax: [phone]    Patient notified that their request is being sent to the clinical staff for review and that they should receive a response within 2 business days.   Please advise at [facility] [phone]

## 2023-09-02 ENCOUNTER — Ambulatory Visit: Admitting: Family Medicine

## 2023-09-02 ENCOUNTER — Other Ambulatory Visit: Payer: Self-pay | Admitting: Family Medicine

## 2023-09-02 DIAGNOSIS — I10 Essential (primary) hypertension: Secondary | ICD-10-CM

## 2023-09-02 MED ORDER — AMLODIPINE BESYLATE 2.5 MG PO TABS
2.5000 mg | ORAL_TABLET | Freq: Every day | ORAL | 1 refills | Status: DC
Start: 2023-09-02 — End: 2023-09-03

## 2023-09-02 MED ORDER — INDAPAMIDE 1.25 MG PO TABS
1.2500 mg | ORAL_TABLET | Freq: Every day | ORAL | 1 refills | Status: DC
Start: 2023-09-02 — End: 2023-09-03

## 2023-09-03 ENCOUNTER — Other Ambulatory Visit: Payer: Self-pay

## 2023-09-03 DIAGNOSIS — I10 Essential (primary) hypertension: Secondary | ICD-10-CM

## 2023-09-03 MED ORDER — AMLODIPINE BESYLATE 2.5 MG PO TABS: 2.5000 mg | ORAL_TABLET | Freq: Every day | ORAL | 1 refills | Status: DC

## 2023-09-03 MED ORDER — INDAPAMIDE 1.25 MG PO TABS: 1.2500 mg | ORAL_TABLET | Freq: Every day | ORAL | 1 refills | Status: DC

## 2023-09-03 NOTE — Telephone Encounter (Signed)
 Resent to correct pharmacy.

## 2023-09-11 ENCOUNTER — Encounter: Payer: Self-pay | Admitting: Family Medicine

## 2023-09-11 ENCOUNTER — Ambulatory Visit: Admitting: Family Medicine

## 2023-09-11 VITALS — BP 148/82 | HR 96 | Temp 98.2°F | Resp 18 | Ht 70.0 in | Wt 216.0 lb

## 2023-09-11 DIAGNOSIS — I10 Essential (primary) hypertension: Secondary | ICD-10-CM

## 2023-09-11 DIAGNOSIS — E876 Hypokalemia: Secondary | ICD-10-CM

## 2023-09-11 DIAGNOSIS — G4730 Sleep apnea, unspecified: Secondary | ICD-10-CM

## 2023-09-11 MED ORDER — POTASSIUM CHLORIDE CRYS ER 20 MEQ PO TBCR
20.0000 meq | EXTENDED_RELEASE_TABLET | Freq: Every day | ORAL | 0 refills | Status: DC
Start: 2023-09-11 — End: 2023-10-19

## 2023-09-11 MED ORDER — INDAPAMIDE 1.25 MG PO TABS: 1.2500 mg | ORAL_TABLET | Freq: Every day | ORAL | 0 refills | Status: DC

## 2023-09-11 MED ORDER — AMLODIPINE BESYLATE 5 MG PO TABS
5.0000 mg | ORAL_TABLET | Freq: Every day | ORAL | 0 refills | Status: DC
Start: 2023-09-11 — End: 2023-10-19

## 2023-09-11 NOTE — Progress Notes (Signed)
SUBJECTIVE:   Chief Complaint  Patient presents with   Hypertension   HPI Presents for follow up chronic disease management  Discussed the use of AI scribe software for clinical note transcription with the patient, who gave verbal consent to proceed.  History of Present Illness The patient, with a history of hypertension, presents with concerns about persistently elevated blood pressure despite current treatment with amlodipine and a diuretic, indapamide. She reports that her home readings, taken at various times in the afternoon, typically range from 140-144 systolic and around 86 diastolic. She notes that her blood pressure was higher than usual after a recent illness, which she attributes to the stress of being unwell.  The patient also mentions that she has been experiencing increased urination since starting the indapamide, which she takes later in the day due to her sleep schedule. She reports sleeping for approximately 12 hours and not waking until late morning or early afternoon.  In addition to her hypertension, the patient has a history of mild sleep apnea, for which she has a CPAP machine. However, she reports difficulty tolerating the mask and therefore does not use the machine regularly. She also has a history of asthma, which is managed with an inhaler.  The patient's potassium levels were previously found to be slightly low, which was managed with potassium supplementation. She reports that she has been adhering to this treatment.  The patient's daughter, who also has hypertension, was mentioned during the consultation. The patient expressed concern about her daughter's health, noting that she had recently been to the emergency department due to high blood pressure and chest pain. However, this information is not directly related to the patient's own health status and is therefore not included in the main body of the HPI.      PERTINENT PMH / PSH: As above  OBJECTIVE:   BP (!) 148/82   Pulse 96   Temp 98.2 F (36.8 C) (Oral)   Resp 18   Ht 5\' 10"  (1.778 m)   Wt 216 lb (98 kg)   SpO2 97%   BMI 30.99 kg/m    Physical Exam Vitals reviewed.  Constitutional:      General: She is not in acute distress.    Appearance: Normal appearance. She is obese. She is not ill-appearing, toxic-appearing or diaphoretic.  Eyes:     General:        Right eye: No discharge.        Left eye: No discharge.     Conjunctiva/sclera: Conjunctivae normal.  Cardiovascular:     Rate and Rhythm: Normal rate and regular rhythm.     Heart sounds: Normal heart sounds.  Pulmonary:     Effort: Pulmonary effort is normal.     Breath sounds: Normal breath sounds.  Abdominal:     General: Bowel sounds are normal.  Musculoskeletal:        General: Normal range of motion.  Skin:    General: Skin is warm and dry.  Neurological:     General: No focal deficit present.     Mental Status: She is alert and oriented to person, place, and time. Mental status is at baseline.  Psychiatric:        Mood and Affect: Mood normal.        Behavior: Behavior normal.        Thought Content: Thought content normal.        Judgment: Judgment normal.  09/11/2023    2:55 PM 05/30/2023    1:34 PM 05/28/2023    1:50 PM 04/15/2023   11:05 AM 04/01/2023    1:02 PM  Depression screen PHQ 2/9  Decreased Interest 2 0 0 0 1  Down, Depressed, Hopeless 1 0 0 0 0  PHQ - 2 Score 3 0 0 0 1  Altered sleeping 3 2 2 2 3   Tired, decreased energy 3 2 2 2 3   Change in appetite 1 0  1 0  Feeling bad or failure about yourself  0 0 0 0 0  Trouble concentrating 0 0 0 0 0  Moving slowly or fidgety/restless 0 0 0 0 0  Suicidal thoughts 0 0 0 0 0  PHQ-9 Score 10 4 4 5 7   Difficult doing work/chores Somewhat difficult Not difficult at all Not difficult at all Not difficult at all Not difficult at all      09/11/2023    2:56 PM 05/30/2023    1:34 PM 05/28/2023    1:51 PM 04/15/2023   11:05 AM  GAD 7  : Generalized Anxiety Score  Nervous, Anxious, on Edge 0 0 0 0  Control/stop worrying 0 0 0 0  Worry too much - different things 1 0 0 0  Trouble relaxing 0 0 0 0  Restless 0 0 0 0  Easily annoyed or irritable 1 0 0 0  Afraid - awful might happen 1 0 0 0  Total GAD 7 Score 3 0 0 0  Anxiety Difficulty  Not difficult at all Somewhat difficult Not difficult at all    ASSESSMENT/PLAN:  Essential (primary) hypertension Assessment & Plan: Blood pressure readings at home in the 140s/80s. Currently on Amlodipine 2.5mg  and Indapamide 1.25mg . Discussed the importance of consistent medication timing and adherence. -Increase Amlodipine to 5mg  daily. -Continue Indapamide 1.25mg  daily. -Check blood pressure at home and follow up in 4 weeks.  Orders: -     Basic metabolic panel -     Indapamide; Take 1 tablet (1.25 mg total) by mouth daily.  Dispense: 60 tablet; Refill: 0 -     amLODIPine Besylate; Take 1 tablet (5 mg total) by mouth daily.  Dispense: 60 tablet; Refill: 0  Hypokalemia Assessment & Plan: Previously low potassium level, currently on potassium supplementation. -Continue potassium supplementation. -Order Basic Metabolic Panel (BMP) today to check current potassium level.  Orders: -     Potassium Chloride Crys ER; Take 1 tablet (20 mEq total) by mouth daily.  Dispense: 60 tablet; Refill: 0  Sleep apnea, unspecified type Assessment & Plan: Patient has a diagnosis of mild sleep apnea but is not using CPAP machine due to discomfort with mask. -Encouraged patient to consider different mask options and use CPAP machine as it can help with blood pressure control.      PDMP reviewed  Return in about 4 weeks (around 10/09/2023) for PCP, HTN.  [name], MD

## 2023-09-11 NOTE — Patient Instructions (Signed)
It was a pleasure meeting you today. Thank you for allowing me to take part in your health care.  Our goals for today as we discussed include:  Increase Amlodipine to 5 mg at night Continue Indapimde 1.25 mg daily Continue Potassium 20 mEq daily  Continue to monitor blood pressure. Goal <150/90  Follow up in 4 weeks   We will get some labs today.  If they are abnormal or we need to do something about them, I will call you.  If they are normal, I will send you a message on MyChart (if it is active) or a letter in the mail.  If you don't hear from [place] in 2 weeks, please call the office at the number below.    This is a list of the screening recommended for you and due dates:  Health Maintenance  Topic Date Due   COVID-19 Vaccine (1) Never done   Pneumococcal Vaccination (1 of 2 - PCV) Never done   Zoster (Shingles) Vaccine (2 of 2) 01/13/2017   DTaP/Tdap/Td vaccine (2 - Td or Tdap) 11/19/2020   Flu Shot  11/24/2023*   Pap with HPV screening  12/16/2024   Mammogram  06/12/2025   Colon Cancer Screening  12/17/2028   Hepatitis C Screening  Completed   HIV Screening  Completed   HPV Vaccine  Aged Out  *Topic was postponed. The date shown is not the original due date.      If you have any questions or concerns, please do not hesitate to call the office at [phone].  I look forward to our next visit and until then take care and stay safe.  Regards,   [name], MD   [facility]

## 2023-09-12 LAB — BASIC METABOLIC PANEL
BUN: 12 mg/dL (ref 6–23)
CO2: 29 meq/L (ref 19–32)
Calcium: 9.7 mg/dL (ref 8.4–10.5)
Chloride: 102 meq/L (ref 96–112)
Creatinine, Ser: 0.99 mg/dL (ref 0.40–1.20)
GFR: 62.68 mL/min (ref >60.00)
Glucose, Bld: 77 mg/dL (ref 70–99)
Potassium: 3.9 meq/L (ref 3.5–5.1)
Sodium: 141 meq/L (ref 135–145)

## 2023-09-17 NOTE — Progress Notes (Unsigned)
    GYNECOLOGY PROGRESS NOTE  Subjective:  PCP: [name], MD  Patient ID: Emma Barker, female    DOB: 1966-08-20, 58 y.o.   MRN: [id number]  HPI  Patient is a 58 y.o. [id number] female who presents for a breast exam  {Common ambulatory SmartLinks:19316}  Review of Systems {ros; complete:30496}   Objective:   There were no vitals taken for this visit. There is no height or weight on file to calculate BMI.  General appearance: {general exam:16600} Abdomen: {abdominal exam:16834} Pelvic: {pelvic exam:16852::"cervix normal in appearance","external genitalia normal","no adnexal masses or tenderness","no cervical motion tenderness","rectovaginal septum normal","uterus normal size, shape, and consistency","vagina normal without discharge"} Extremities: {extremity exam:5109} Neurologic: {neuro exam:17854}   Assessment/Plan:   No diagnosis found.   There are no diagnoses linked to this encounter.     Julieanne Manson, DO [facility]

## 2023-09-18 ENCOUNTER — Ambulatory Visit: Admitting: Obstetrics

## 2023-09-18 ENCOUNTER — Encounter: Payer: Self-pay | Admitting: Obstetrics

## 2023-09-18 VITALS — BP 130/90 | Ht 70.0 in | Wt 221.0 lb

## 2023-09-18 DIAGNOSIS — N644 Mastodynia: Secondary | ICD-10-CM | POA: Diagnosis not present

## 2023-09-26 ENCOUNTER — Encounter: Payer: Self-pay | Admitting: Family Medicine

## 2023-09-26 DIAGNOSIS — G4730 Sleep apnea, unspecified: Secondary | ICD-10-CM | POA: Insufficient documentation

## 2023-09-26 NOTE — Assessment & Plan Note (Signed)
Patient has a diagnosis of mild sleep apnea but is not using CPAP machine due to discomfort with mask. -Encouraged patient to consider different mask options and use CPAP machine as it can help with blood pressure control.

## 2023-09-26 NOTE — Assessment & Plan Note (Signed)
Blood pressure readings at home in the 140s/80s. Currently on Amlodipine 2.5mg  and Indapamide 1.25mg . Discussed the importance of consistent medication timing and adherence. -Increase Amlodipine to 5mg  daily. -Continue Indapamide 1.25mg  daily. -Check blood pressure at home and follow up in 4 weeks.

## 2023-09-26 NOTE — Assessment & Plan Note (Signed)
Previously low potassium level, currently on potassium supplementation. -Continue potassium supplementation. -Order Basic Metabolic Panel (BMP) today to check current potassium level.

## 2023-09-29 ENCOUNTER — Ambulatory Visit
Admission: RE | Admit: 2023-09-29 | Discharge: 2023-09-29 | Disposition: A | Discharge status: Home / Self Care | Source: Ambulatory Visit | Attending: Obstetrics

## 2023-09-29 ENCOUNTER — Ambulatory Visit
Admission: RE | Admit: 2023-09-29 | Discharge: 2023-09-29 | Disposition: A | Discharge status: Home / Self Care | Source: Ambulatory Visit | Attending: Obstetrics | Admitting: Obstetrics

## 2023-09-29 DIAGNOSIS — N644 Mastodynia: Secondary | ICD-10-CM

## 2023-09-30 ENCOUNTER — Other Ambulatory Visit: Payer: Self-pay | Admitting: Obstetrics

## 2023-10-07 ENCOUNTER — Ambulatory Visit (INDEPENDENT_AMBULATORY_CARE_PROVIDER_SITE_OTHER): Admitting: Dermatology

## 2023-10-07 ENCOUNTER — Encounter: Payer: Self-pay | Admitting: Dermatology

## 2023-10-07 DIAGNOSIS — Z1283 Encounter for screening for malignant neoplasm of skin: Secondary | ICD-10-CM

## 2023-10-07 DIAGNOSIS — W908XXA Exposure to other nonionizing radiation, initial encounter: Secondary | ICD-10-CM

## 2023-10-07 DIAGNOSIS — L578 Other skin changes due to chronic exposure to nonionizing radiation: Secondary | ICD-10-CM

## 2023-10-07 DIAGNOSIS — D1801 Hemangioma of skin and subcutaneous tissue: Secondary | ICD-10-CM

## 2023-10-07 DIAGNOSIS — Z7189 Other specified counseling: Secondary | ICD-10-CM

## 2023-10-07 DIAGNOSIS — D229 Melanocytic nevi, unspecified: Secondary | ICD-10-CM

## 2023-10-07 DIAGNOSIS — Z8589 Personal history of malignant neoplasm of other organs and systems: Secondary | ICD-10-CM

## 2023-10-07 DIAGNOSIS — L814 Other melanin hyperpigmentation: Secondary | ICD-10-CM | POA: Diagnosis not present

## 2023-10-07 DIAGNOSIS — I781 Nevus, non-neoplastic: Secondary | ICD-10-CM

## 2023-10-07 DIAGNOSIS — L821 Other seborrheic keratosis: Secondary | ICD-10-CM

## 2023-10-07 DIAGNOSIS — L905 Scar conditions and fibrosis of skin: Secondary | ICD-10-CM

## 2023-10-07 DIAGNOSIS — L918 Other hypertrophic disorders of the skin: Secondary | ICD-10-CM

## 2023-10-07 DIAGNOSIS — Z85828 Personal history of other malignant neoplasm of skin: Secondary | ICD-10-CM

## 2023-10-07 DIAGNOSIS — L900 Lichen sclerosus et atrophicus: Secondary | ICD-10-CM
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
## 2023-10-07 NOTE — Patient Instructions (Addendum)
For Skin Tags: Procedure code: 09811 ICD 10: L91.8     Due to recent changes in healthcare laws, you may see results of your pathology and/or laboratory studies on MyChart before the doctors have had a chance to review them. We understand that in some cases there may be results that are confusing or concerning to you. Please understand that not all results are received at the same time and often the doctors may need to interpret multiple results in order to provide you with the best plan of care or course of treatment. Therefore, we ask that you please give [place] 2 business days to thoroughly review all your results before contacting the office for clarification. Should we see a critical lab result, you will be contacted sooner.   If You Need Anything After Your Visit  If you have any questions or concerns for your doctor, please call our main line at [phone] and press option 4 to reach your doctor's medical assistant. If no one answers, please leave a voicemail as directed and we will return your call as soon as possible. Messages left after 4 pm will be answered the following business day.   You may also send [place] a message via MyChart. We typically respond to MyChart messages within 1-2 business days.  For prescription refills, please ask your pharmacy to contact our office. Our fax number is [phone].  If you have an urgent issue when the clinic is closed that cannot wait until the next business day, you can page your doctor at the number below.    Please note that while we do our best to be available for urgent issues outside of office hours, we are not available 24/7.   If you have an urgent issue and are unable to reach [place], you may choose to seek medical care at your doctor's office, retail clinic, urgent care center, or emergency room.  If you have a medical emergency, please immediately call 911 or go to the emergency department.  Pager Numbers  - Dr. Gwen Pounds:  [phone]  - Dr. Roseanne Reno: [phone]  - Dr. Katrinka Blazing: [phone]   In the event of inclement weather, please call our main line at [phone] for an update on the status of any delays or closures.  Dermatology Medication Tips: Please keep the boxes that topical medications come in in order to help keep track of the instructions about where and how to use these. Pharmacies typically print the medication instructions only on the boxes and not directly on the medication tubes.   If your medication is too expensive, please contact our office at [phone] option 4 or send [place] a message through MyChart.   We are unable to tell what your co-pay for medications will be in advance as this is different depending on your insurance coverage. However, we may be able to find a substitute medication at lower cost or fill out paperwork to get insurance to cover a needed medication.   If a prior authorization is required to get your medication covered by your insurance company, please allow [place] 1-2 business days to complete this process.  Drug prices often vary depending on where the prescription is filled and some pharmacies may offer cheaper prices.  The website www.goodrx.com contains coupons for medications through different pharmacies. The prices here do not account for what the cost may be with help from insurance (it may be cheaper with your insurance), but the website can give you the price if you did not use  any insurance.  - You can print the associated coupon and take it with your prescription to the pharmacy.  - You may also stop by our office during regular business hours and pick up a GoodRx coupon card.  - If you need your prescription sent electronically to a different pharmacy, notify our office through [facility] or by phone at [phone] option 4.     Si Usted Necesita Algo Despus de Su Visita  Tambin puede enviarnos un mensaje a travs de Clinical cytogeneticist. Por lo general  respondemos a los mensajes de MyChart en el transcurso de 1 a 2 das hbiles.  Para renovar recetas, por favor pida a su farmacia que se ponga en contacto con nuestra oficina. Annie Sable de fax es [place] [phone].  Si tiene un asunto urgente cuando la clnica est cerrada y que no puede esperar hasta el siguiente da hbil, puede llamar/localizar a su doctor(a) al nmero que aparece a continuacin.   Por favor, tenga en cuenta que aunque hacemos todo lo posible para estar disponibles para asuntos urgentes fuera del horario de [place], no estamos disponibles las 24 horas del da, los 7 [address]  [address] de la [place].   Si tiene un problema urgente y no puede comunicarse con nosotros, puede optar por buscar atencin mdica  en el consultorio de su doctor(a), en una clnica privada, en un centro de atencin urgente o en una sala de emergencias.  Si tiene Engineer, drilling, por favor llame inmediatamente al 911 o vaya a la sala de emergencias.  Nmeros de bper  - Dr. Gwen Pounds: [phone]  - Dra. Roseanne Reno: [phone]  - Dr. Katrinka Blazing: [phone]   En caso de inclemencias del tiempo, por favor llame a Lacy Duverney principal al [phone] para una actualizacin sobre el [place] de cualquier retraso o cierre.  Consejos para la medicacin en dermatologa: Por favor, guarde las cajas en las que vienen los medicamentos de uso tpico para ayudarle a seguir las instrucciones sobre dnde y cmo usarlos. Las farmacias generalmente imprimen las instrucciones del medicamento slo en las cajas y no directamente en los tubos del [place].   Si su medicamento es muy caro, por favor, pngase en contacto con Rolm Gala llamando al [phone] y presione la opcin 4 o envenos un mensaje a travs de Clinical cytogeneticist.   No podemos decirle cul ser su copago por los medicamentos por adelantado ya que esto es diferente dependiendo de la cobertura de su seguro. Sin embargo, es posible que podamos encontrar un  medicamento sustituto a Audiological scientist un formulario para que el seguro cubra el medicamento que se considera necesario.   Si se requiere una autorizacin previa para que su compaa de seguros [place] su medicamento, por favor permtanos de 1 a 2 das hbiles para completar [address]  Los precios de los medicamentos varan con frecuencia dependiendo del Environmental consultant de dnde se surte la receta y alguna farmacias pueden ofrecer precios ms baratos.  El sitio web www.goodrx.com tiene cupones para medicamentos de Health and safety inspector. Los precios aqu no tienen en cuenta lo que podra costar con la ayuda del seguro (puede ser ms barato con su seguro), pero el sitio web puede darle el precio si no utiliz Tourist information centre manager.  - Puede imprimir el cupn correspondiente y llevarlo con su receta a la farmacia.  - Tambin puede pasar por nuestra oficina durante el horario de atencin regular y Education officer, museum una tarjeta de cupones de GoodRx.  - Si necesita que su receta se enve electrnicamente  a [place] [employer], informe a nuestra oficina a travs de MyChart de  o por telfono llamando al [phone] y presione la opcin 4.

## 2023-10-10 ENCOUNTER — Encounter: Payer: Self-pay | Admitting: Family Medicine

## 2023-10-10 ENCOUNTER — Ambulatory Visit: Admitting: Family Medicine

## 2023-10-10 VITALS — BP 130/86 | HR 85 | Temp 98.2°F | Resp 18 | Ht 70.0 in | Wt 220.2 lb

## 2023-10-10 DIAGNOSIS — I10 Essential (primary) hypertension: Secondary | ICD-10-CM | POA: Diagnosis not present

## 2023-10-10 DIAGNOSIS — R42 Dizziness and giddiness: Secondary | ICD-10-CM

## 2023-10-10 DIAGNOSIS — E66811 Obesity, class 1: Secondary | ICD-10-CM | POA: Diagnosis not present

## 2023-10-10 DIAGNOSIS — E876 Hypokalemia: Secondary | ICD-10-CM

## 2023-10-10 DIAGNOSIS — Z6831 Body mass index (BMI) 31.0-31.9, adult: Secondary | ICD-10-CM

## 2023-10-10 NOTE — Patient Instructions (Addendum)
It was a pleasure meeting you today. Thank you for allowing me to take part in your health care.  Our goals for today as we discussed include:  Continue current medications  Follow up in 6 months   This is a list of the screening recommended for you and due dates:  Health Maintenance  Topic Date Due   COVID-19 Vaccine (1) Never done   Pneumococcal Vaccination (1 of 2 - PCV) Never done   Zoster (Shingles) Vaccine (2 of 2) 01/13/2017   DTaP/Tdap/Td vaccine (2 - Td or Tdap) 11/19/2020   Flu Shot  11/24/2023*   Pap with HPV screening  12/16/2024   Mammogram  06/12/2025   Colon Cancer Screening  12/17/2028   Hepatitis C Screening  Completed   HIV Screening  Completed   HPV Vaccine  Aged Out  *Topic was postponed. The date shown is not the original due date.      If you have any questions or concerns, please do not hesitate to call the office at [phone].  I look forward to our next visit and until then take care and stay safe.  Regards,   [name], MD   [facility]

## 2023-10-10 NOTE — Progress Notes (Signed)
 SUBJECTIVE:   Chief Complaint  Patient presents with   Hypertension    4 Weeks follow up   HPI Presents for follow up chronic disease management   Discussed the use of AI scribe software for clinical note transcription with the patient, who gave verbal consent to proceed.  History of Present Illness Emma Barker is a 58 year old female with hypertension who presents for a four-week follow-up for high blood pressure management.  She is attending a four-week follow-up for hypertension management. Her blood pressure medication appears effective, with fewer episodes of lightheadedness upon standing. However, she occasionally experiences lightheadedness when bending over, such as during shopping or at home. She does not routinely check her blood pressure during these episodes due to the lack of a portable device. At home, her blood pressure readings are typically in the 140s, with occasional readings in the 130s.  She experiences dizziness when lying in bed and turning her head to the right, but not to the left. She occasionally feels lightheaded when standing. No dizziness is reported when turning to the left. She does not believe her ear itching is related to her lightheadedness.  She is currently taking indapamide, a water pill, at a dose of one per day, and amlodipine. She initially received a 30-day supply of indapamide, followed by a 60-day supply, and reports having enough medication for the next couple of months. She believes she has enough amlodipine supply through [facility].  She discusses her weight history, noting fluctuations and challenges with weight loss despite previous exercise routines. She mentions a past diagnosis of a thyroid issue and the impact of medications like Paxil and Effexor on her weight. She experiences fatigue and difficulty with motivation for physical activity.   PERTINENT PMH / PSH: As above  OBJECTIVE:  BP 130/86   Pulse 85   Temp 98.2  F (36.8 C)   Resp 18   Ht 5\' 10"  (1.778 m)   Wt 220 lb 4 oz (99.9 kg)   SpO2 96%   BMI 31.60 kg/m    Physical Exam Vitals reviewed.  Constitutional:      General: She is not in acute distress.    Appearance: Normal appearance. She is obese. She is not ill-appearing, toxic-appearing or diaphoretic.  HENT:     Right Ear: Tympanic membrane, ear canal and external ear normal. There is no impacted cerumen.     Left Ear: Tympanic membrane, ear canal and external ear normal. There is no impacted cerumen.     Mouth/Throat:     Mouth: Mucous membranes are moist.  Eyes:     General:        Right eye: No discharge.        Left eye: No discharge.     Conjunctiva/sclera: Conjunctivae normal.  Cardiovascular:     Rate and Rhythm: Normal rate.  Pulmonary:     Effort: Pulmonary effort is normal.  Musculoskeletal:        General: Normal range of motion.  Skin:    General: Skin is warm and dry.  Neurological:     General: No focal deficit present.     Mental Status: She is alert and oriented to person, place, and time. Mental status is at baseline.  Psychiatric:        Mood and Affect: Mood normal.        Behavior: Behavior normal.        Thought Content: Thought content normal.  Judgment: Judgment normal.           10/10/2023   12:47 PM 09/11/2023    2:55 PM 05/30/2023    1:34 PM 05/28/2023    1:50 PM 04/15/2023   11:05 AM  Depression screen PHQ 2/9  Decreased Interest 1 2 0 0 0  Down, Depressed, Hopeless 1 1 0 0 0  PHQ - 2 Score 2 3 0 0 0  Altered sleeping 3 3 2 2 2   Tired, decreased energy 3 3 2 2 2   Change in appetite 0 1 0  1  Feeling bad or failure about yourself  0 0 0 0 0  Trouble concentrating 0 0 0 0 0  Moving slowly or fidgety/restless 0 0 0 0 0  Suicidal thoughts 0 0 0 0 0  PHQ-9 Score 8 10 4 4 5   Difficult doing work/chores Not difficult at all Somewhat difficult Not difficult at all Not difficult at all Not difficult at all      10/10/2023   12:48 PM  09/11/2023    2:56 PM 05/30/2023    1:34 PM 05/28/2023    1:51 PM  GAD 7 : Generalized Anxiety Score  Nervous, Anxious, on Edge 0 0 0 0  Control/stop worrying 1 0 0 0  Worry too much - different things 0 1 0 0  Trouble relaxing 0 0 0 0  Restless 0 0 0 0  Easily annoyed or irritable 0 1 0 0  Afraid - awful might happen 0 1 0 0  Total GAD 7 Score 1 3 0 0  Anxiety Difficulty Not difficult at all  Not difficult at all Somewhat difficult    ASSESSMENT/PLAN:  Essential (primary) hypertension Assessment & Plan: Improved control on current regimen of Indapamide and Amlodipine. Reports occasional lightheadedness, particularly when bending over, but no orthostatic hypotension noted in office. -Continue current regimen of Indapamide and Amlodipine. -Advise patient to monitor blood pressure at home, particularly after episodes of lightheadedness. -Check blood pressure in 6 months or sooner if patient reports elevated readings at home.  Orders: -     Indapamide; Take 1 tablet (1.25 mg total) by mouth daily.  Dispense: 90 tablet; Refill: 3 -     amLODIPine Besylate; Take 1 tablet (5 mg total) by mouth daily.  Dispense: 90 tablet; Refill: 3  Vertigo Assessment & Plan: Reports dizziness when turning head to the right. Possible benign paroxysmal positional vertigo (BPPV) given the positional nature of symptoms. No focal neuro deficits. -Provide patient with instructions for home Epley maneuver. -Advise patient to try maneuvers at home and monitor for improvement in symptoms. -Consider referral to physical therapy for vestibular rehabilitation if symptoms persist or if patient has difficulty performing maneuvers at home.   Class 1 obesity Assessment & Plan: Patient reports weight gain and difficulty with motivation for physical activity. -Encourage patient to increase physical activity and consider strength training to increase muscle mass and metabolic rate. -Advise patient to continue monitoring  diet, particularly salt intake, to assist with blood pressure control and weight management. -Consider referral to nutritionist or weight management program if patient continues to struggle with weight loss.   Hypokalemia -     Potassium Chloride Crys ER; Take 1 tablet (20 mEq total) by mouth daily.  Dispense: 90 tablet; Refill: 3    PDMP reviewed  Return in about 6 months (around 04/08/2024) for PCP, HTN.  [name], MD

## 2023-10-15 ENCOUNTER — Ambulatory Visit: Admitting: Dermatology

## 2023-10-19 ENCOUNTER — Encounter: Payer: Self-pay | Admitting: Family Medicine

## 2023-10-19 DIAGNOSIS — R42 Dizziness and giddiness: Secondary | ICD-10-CM | POA: Insufficient documentation

## 2023-10-19 MED ORDER — POTASSIUM CHLORIDE CRYS ER 20 MEQ PO TBCR
20.0000 meq | EXTENDED_RELEASE_TABLET | Freq: Every day | ORAL | 3 refills | Status: DC
Start: 2023-10-19 — End: 2024-03-10

## 2023-10-19 MED ORDER — INDAPAMIDE 1.25 MG PO TABS: 1.2500 mg | ORAL_TABLET | Freq: Every day | ORAL | 3 refills | Status: AC

## 2023-10-19 MED ORDER — AMLODIPINE BESYLATE 5 MG PO TABS
5.0000 mg | ORAL_TABLET | Freq: Every day | ORAL | 3 refills | Status: AC
Start: 2023-10-19 — End: ?

## 2023-10-19 NOTE — Assessment & Plan Note (Signed)
 Improved control on current regimen of Indapamide and Amlodipine. Reports occasional lightheadedness, particularly when bending over, but no orthostatic hypotension noted in office. -Continue current regimen of Indapamide and Amlodipine. -Advise patient to monitor blood pressure at home, particularly after episodes of lightheadedness. -Check blood pressure in 6 months or sooner if patient reports elevated readings at home.

## 2023-10-19 NOTE — Assessment & Plan Note (Signed)
 Reports dizziness when turning head to the right. Possible benign paroxysmal positional vertigo (BPPV) given the positional nature of symptoms. No focal neuro deficits. -Provide patient with instructions for home Epley maneuver. -Advise patient to try maneuvers at home and monitor for improvement in symptoms. -Consider referral to physical therapy for vestibular rehabilitation if symptoms persist or if patient has difficulty performing maneuvers at home.

## 2023-10-19 NOTE — Assessment & Plan Note (Signed)
 Patient reports weight gain and difficulty with motivation for physical activity. -Encourage patient to increase physical activity and consider strength training to increase muscle mass and metabolic rate. -Advise patient to continue monitoring diet, particularly salt intake, to assist with blood pressure control and weight management. -Consider referral to nutritionist or weight management program if patient continues to struggle with weight loss.

## 2023-11-21 ENCOUNTER — Telehealth: Payer: Self-pay | Admitting: Family Medicine

## 2023-11-21 NOTE — Telephone Encounter (Signed)
 Dr Clent Ridges is leaving the practice and your appointment on 04/08/24 needs to be rescheduled with another provider. Also you will need to schedule a transfer of care appointment with another provider to continue care at this office. Please call the office to schedule a Transfer of Care to either Dr Charlann Lange, MD, Bethanie Dicker, NP or Kara Dies, NP.   Thank you  E2C2, when they call back, please reschedule this patient's August visit as a TOC visit. [facility]

## 2023-12-17 ENCOUNTER — Ambulatory Visit (INDEPENDENT_AMBULATORY_CARE_PROVIDER_SITE_OTHER): Admitting: Family Medicine

## 2023-12-17 ENCOUNTER — Encounter: Payer: Self-pay | Admitting: Family Medicine

## 2023-12-17 ENCOUNTER — Ambulatory Visit
Admission: RE | Admit: 2023-12-17 | Discharge: 2023-12-17 | Disposition: A | Discharge status: Home / Self Care | Source: Ambulatory Visit | Attending: Family Medicine | Admitting: Family Medicine

## 2023-12-17 VITALS — BP 138/78 | HR 80 | Temp 98.3°F | Resp 20 | Ht 70.0 in | Wt 223.5 lb

## 2023-12-17 DIAGNOSIS — R1084 Generalized abdominal pain: Secondary | ICD-10-CM | POA: Diagnosis present

## 2023-12-17 DIAGNOSIS — R8290 Unspecified abnormal findings in urine: Secondary | ICD-10-CM | POA: Diagnosis not present

## 2023-12-17 DIAGNOSIS — R319 Hematuria, unspecified: Secondary | ICD-10-CM

## 2023-12-17 LAB — COMPREHENSIVE METABOLIC PANEL WITH GFR
ALT: 28 U/L (ref 0–35)
AST: 19 U/L (ref 0–37)
Albumin: 4.5 g/dL (ref 3.5–5.2)
Alkaline Phosphatase: 67 U/L (ref 39–117)
BUN: 14 mg/dL (ref 6–23)
CO2: 32 meq/L (ref 19–32)
Calcium: 9.8 mg/dL (ref 8.4–10.5)
Chloride: 101 meq/L (ref 96–112)
Creatinine, Ser: 0.93 mg/dL (ref 0.40–1.20)
GFR: 67.44 mL/min (ref >60.00)
Glucose, Bld: 76 mg/dL (ref 70–99)
Potassium: 3.7 meq/L (ref 3.5–5.1)
Sodium: 141 meq/L (ref 135–145)
Total Bilirubin: 0.9 mg/dL (ref 0.2–1.2)
Total Protein: 7.2 g/dL (ref 6.0–8.3)

## 2023-12-17 LAB — CBC WITH DIFFERENTIAL/PLATELET
Basophils Absolute: 0.1 10*3/uL (ref 0.0–0.1)
Basophils Relative: 1.1 % (ref 0.0–3.0)
Eosinophils Absolute: 0.2 10*3/uL (ref 0.0–0.7)
Eosinophils Relative: 2.1 % (ref 0.0–5.0)
HCT: 44.9 % (ref 36.0–46.0)
Hemoglobin: 15.2 g/dL — ABNORMAL HIGH (ref 12.0–15.0)
Lymphocytes Relative: 26.9 % (ref 12.0–46.0)
Lymphs Abs: 2.1 10*3/uL (ref 0.7–4.0)
MCHC: 33.8 g/dL (ref 30.0–36.0)
MCV: 93.3 fl (ref 78.0–100.0)
Monocytes Absolute: 0.8 10*3/uL (ref 0.1–1.0)
Monocytes Relative: 9.6 % (ref 3.0–12.0)
Neutro Abs: 4.7 10*3/uL (ref 1.4–7.7)
Neutrophils Relative %: 60.3 % (ref 43.0–77.0)
Platelets: 292 10*3/uL (ref 150.0–400.0)
RBC: 4.82 Mil/uL (ref 3.87–5.11)
RDW: 13.9 % (ref 11.5–15.5)
WBC: 7.9 10*3/uL (ref 4.0–10.5)

## 2023-12-17 LAB — LIPASE: Lipase: 149 U/L — ABNORMAL HIGH (ref 11.0–59.0)

## 2023-12-17 LAB — URINALYSIS, ROUTINE W REFLEX MICROSCOPIC
Bilirubin Urine: NEGATIVE
Ketones, ur: NEGATIVE
Leukocytes,Ua: NEGATIVE
Nitrite: NEGATIVE
Specific Gravity, Urine: 1.015 (ref 1.000–1.030)
Total Protein, Urine: NEGATIVE
Urine Glucose: NEGATIVE
Urobilinogen, UA: 0.2 (ref 0.0–1.0)
pH: 6 (ref 5.0–8.0)

## 2023-12-17 LAB — POC URINALSYSI DIPSTICK (AUTOMATED)
Bilirubin, UA: NEGATIVE
Glucose, UA: NEGATIVE
Ketones, UA: NEGATIVE
Leukocytes, UA: NEGATIVE
Nitrite, UA: NEGATIVE
Protein, UA: NEGATIVE
Spec Grav, UA: 1.015 (ref 1.010–1.025)
Urobilinogen, UA: 0.2 U/dL
pH, UA: 6 (ref 5.0–8.0)

## 2023-12-17 NOTE — Assessment & Plan Note (Signed)
 Intermittent hematuria with no visible blood in urine currently. History of tobacco use, increased risk for bladder and RCC, - CT abdomen/pelvis today - Consider Urology referral

## 2023-12-17 NOTE — Patient Instructions (Addendum)
 It was a pleasure meeting you today. Thank you for allowing me to take part in your health care.  Our goals for today as we discussed include:  We will get some labs today.  If they are abnormal or we need to do something about them, I will call you.  If they are normal, I will send you a message on MyChart (if it is active) or a letter in the mail.  If you don't hear from us  in 2 weeks, please call the office at the number below.   Plan for imaging today.  They will call to schedule appointment.  Will notify you of results and continued management   This is a list of the screening recommended for you and due dates:  Health Maintenance  Topic Date Due   COVID-19 Vaccine (1) Never done   Pneumococcal Vaccination (1 of 2 - PCV) Never done   Zoster (Shingles) Vaccine (2 of 2) 01/13/2017   DTaP/Tdap/Td vaccine (2 - Td or Tdap) 11/19/2020   Flu Shot  03/26/2024   Pap with HPV screening  12/16/2024   Mammogram  06/12/2025   Colon Cancer Screening  12/17/2028   Hepatitis C Screening  Completed   HIV Screening  Completed   HPV Vaccine  Aged Out   Meningitis B Vaccine  Aged Out      If you have any questions or concerns, please do not hesitate to call the office at [phone].  I look forward to our next visit and until then take care and stay safe.  Regards,   Valli Gaw, MD   [facility]

## 2023-12-17 NOTE — Assessment & Plan Note (Addendum)
 Recent exacerbation of abdominal pain and bloating x 1 week.  Abdominal exam positive for tenderness in epigastric and left quadrant with decreased bowel sounds.  Hemodynamically stable - Stat CT scan of the abdomen  - Labs today

## 2023-12-17 NOTE — Progress Notes (Signed)
 SUBJECTIVE:   Chief Complaint  Patient presents with   Flank Pain    Pain X 1 week   Weight Gain   HPI Presents for acute visit  Discussed the use of AI scribe software for clinical note transcription with the patient, who gave verbal consent to proceed.  History of Present Illness Emma Barker is a 58 year old female with chronic pancreatitis and a kidney cyst who presents with abdominal and back pain.  She experiences abdominal and back pain, particularly on the left side, which was severe a few days ago. The pain persisted for about two days despite taking pain medication and using a heating pad. There was no recent increased physical activity that could have triggered the pain and no similar pain before. She mentions a slight fever with a temperature of around 21F in the evenings. Her appetite has decreased, and she ensures adequate fluid intake. History of current tobacco use.  She has a history of a kidney cyst, initially found in 2017 at 5 mm and last measured at 1.2 cm in 2019. She has noticed significant bloating, particularly in the evenings, which started over the weekend. Her weight fluctuates significantly throughout the day, and she describes her abdomen as appearing 'eight months pregnant' by the evening. No constipation and regular bowel movements, but experiences bloating and abdominal distention, especially after eating gassy foods.  She has a history of hematuria, which was evaluated with a CT scan in 2019, showing no significant findings. She underwent a cystoscopy in [place] about 20 years ago. She reports increased urination without pain or burning, although she occasionally experiences stabbing pain. No visible blood in her urine but acknowledges a history of hematuria.  Her past medical history includes diverticulosis and chronic pancreatitis. No nausea, vomiting, or changes in stool consistency. Her gallbladder and appendix have been removed, and she has a  history of endometriosis, but no longer has a uterus.  Her current medications include amlodipine  and indapamide , which she takes together at night. This regimen has improved her blood pressure without causing excessive nighttime urination. She continues to smoke.    PERTINENT PMH / PSH: As above  OBJECTIVE:  BP 138/78   Pulse 80   Temp 98.3 F (36.8 C)   Resp 20   Ht 5\' 10"  (1.778 m)   Wt 223 lb 8 oz (101.4 kg)   SpO2 98%   BMI 32.07 kg/m    Physical Exam Vitals reviewed.  Constitutional:      General: She is not in acute distress.    Appearance: Normal appearance. She is normal weight. She is not ill-appearing, toxic-appearing or diaphoretic.  Eyes:     General:        Right eye: No discharge.        Left eye: No discharge.     Conjunctiva/sclera: Conjunctivae normal.  Cardiovascular:     Rate and Rhythm: Normal rate.  Pulmonary:     Effort: Pulmonary effort is normal.  Abdominal:     General: Bowel sounds are decreased.     Palpations: Abdomen is soft. There is no fluid wave, mass or pulsatile mass.     Tenderness: There is abdominal tenderness in the epigastric area and left upper quadrant. There is no left CVA tenderness.     Hernia: No hernia is present.  Musculoskeletal:        General: Normal range of motion.  Skin:    General: Skin is warm and dry.  Neurological:  General: No focal deficit present.     Mental Status: She is alert and oriented to person, place, and time. Mental status is at baseline.  Psychiatric:        Mood and Affect: Mood normal.        Behavior: Behavior normal.        Thought Content: Thought content normal.        Judgment: Judgment normal.           12/17/2023   11:28 AM 10/10/2023   12:47 PM 09/11/2023    2:55 PM 05/30/2023    1:34 PM 05/28/2023    1:50 PM  Depression screen PHQ 2/9  Decreased Interest 0 1 2 0 0  Down, Depressed, Hopeless 0 1 1 0 0  PHQ - 2 Score 0 2 3 0 0  Altered sleeping 0 3 3 2 2   Tired, decreased  energy 3 3 3 2 2   Change in appetite 0 0 1 0   Feeling bad or failure about yourself  0 0 0 0 0  Trouble concentrating 0 0 0 0 0  Moving slowly or fidgety/restless 0 0 0 0 0  Suicidal thoughts 0 0 0 0 0  PHQ-9 Score 3 8 10 4 4   Difficult doing work/chores Not difficult at all Not difficult at all Somewhat difficult Not difficult at all Not difficult at all      10/10/2023   12:48 PM 09/11/2023    2:56 PM 05/30/2023    1:34 PM 05/28/2023    1:51 PM  GAD 7 : Generalized Anxiety Score  Nervous, Anxious, on Edge 0 0 0 0  Control/stop worrying 1 0 0 0  Worry too much - different things 0 1 0 0  Trouble relaxing 0 0 0 0  Restless 0 0 0 0  Easily annoyed or irritable 0 1 0 0  Afraid - awful might happen 0 1 0 0  Total GAD 7 Score 1 3 0 0  Anxiety Difficulty Not difficult at all  Not difficult at all Somewhat difficult    ASSESSMENT/PLAN:  Generalized abdominal pain Assessment & Plan: Recent exacerbation of abdominal pain and bloating x 1 week.  Abdominal exam positive for tenderness in epigastric and left quadrant with decreased bowel sounds.  Hemodynamically stable - Stat CT scan of the abdomen  - Labs today  Orders: -     CT ABDOMEN PELVIS WO CONTRAST -     Comprehensive metabolic panel with GFR -     CBC with Differential/Platelet -     Lipase  Abnormal urinalysis -     POCT Urinalysis Dipstick (Automated) -     Urinalysis, Routine w reflex microscopic  Hematuria, unspecified type Assessment & Plan: Intermittent hematuria with no visible blood in urine currently. History of tobacco use, increased risk for bladder and RCC, - CT abdomen/pelvis today - Consider Urology referral      PDMP reviewed  Return if symptoms worsen or fail to improve, for PCP.  Valli Gaw, MD

## 2023-12-19 ENCOUNTER — Other Ambulatory Visit (HOSPITAL_COMMUNITY): Payer: Self-pay

## 2023-12-19 ENCOUNTER — Other Ambulatory Visit: Payer: Self-pay | Admitting: Family Medicine

## 2023-12-19 ENCOUNTER — Encounter: Payer: Self-pay | Admitting: Family Medicine

## 2023-12-19 ENCOUNTER — Telehealth: Payer: Self-pay | Admitting: Pharmacy Technician

## 2023-12-19 MED ORDER — TIZANIDINE HCL 2 MG PO CAPS: 4.0000 mg | ORAL_CAPSULE | Freq: Three times a day (TID) | ORAL | 0 refills | Status: DC

## 2023-12-19 MED ORDER — NABUMETONE 500 MG PO TABS: 500.0000 mg | ORAL_TABLET | Freq: Every day | ORAL | 0 refills | Status: DC

## 2023-12-19 NOTE — Telephone Encounter (Signed)
 Pharmacy Patient Advocate Encounter   Received notification from CoverMyMeds that prior authorization for tiZANidine HCl 2MG  capsules is required/requested.   Insurance verification completed.   The patient is insured through [employer] .   Per test claim:  TIZANIDINE TABLETS is preferred by the insurance.  If suggested medication is appropriate, Please send in a new RX and discontinue this one. If not, please advise as to why it's not appropriate so that we may request a Prior Authorization. Please note, some preferred medications may still require a PA.  If the suggested medications have not been trialed and there are no contraindications to their use, the PA will not be submitted, as it will not be approved.  CMM Key: [id number]

## 2023-12-19 NOTE — Addendum Note (Signed)
 Addended by: Damin Salido M on: 12/19/2023 01:47 PM   Modules accepted: Orders

## 2023-12-22 NOTE — Telephone Encounter (Signed)
 Per MD patient therapy has been discontinued.

## 2023-12-22 NOTE — Telephone Encounter (Signed)
 Noted.

## 2023-12-23 NOTE — Telephone Encounter (Signed)
 Called and pt and does not need as she already has some muscle relaxer.

## 2024-01-27 ENCOUNTER — Encounter: Payer: Self-pay | Admitting: Family Medicine

## 2024-01-28 ENCOUNTER — Other Ambulatory Visit: Payer: Self-pay

## 2024-01-28 DIAGNOSIS — K219 Gastro-esophageal reflux disease without esophagitis: Secondary | ICD-10-CM

## 2024-01-28 MED ORDER — ESOMEPRAZOLE MAGNESIUM 40 MG PO CPDR
40.0000 mg | DELAYED_RELEASE_CAPSULE | Freq: Every day | ORAL | 3 refills | Status: AC
Start: 2024-01-28 — End: ?

## 2024-01-30 DIAGNOSIS — M5412 Radiculopathy, cervical region: Secondary | ICD-10-CM | POA: Insufficient documentation

## 2024-02-04 ENCOUNTER — Other Ambulatory Visit: Payer: Self-pay | Admitting: Family Medicine

## 2024-02-04 ENCOUNTER — Inpatient Hospital Stay
Admission: RE | Admit: 2024-02-04 | Discharge: 2024-02-04 | Disposition: A | Discharge status: Home / Self Care | Payer: Self-pay | Source: Ambulatory Visit | Attending: Neurosurgery | Admitting: Neurosurgery

## 2024-02-04 DIAGNOSIS — Z049 Encounter for examination and observation for unspecified reason: Secondary | ICD-10-CM

## 2024-02-19 NOTE — Progress Notes (Signed)
 Referring Physician:  No referring provider defined for this encounter.  Primary Physician:  No primary care provider on file.  History of Present Illness: 02/25/2024 [name] is here today with a chief complaint of chronic neck pain.  She has had neck pain for at least the past 30 years, has been worsening over the past year and exacerbated in the last 3 months.  She states that her pain is worse in the midline neck.  Sometimes it goes down her arm but that is much less common.  She feels like her neck is often feeling tight and uncomfortable.  She gets intermittent numbness and tingling.  Does not notice severe worsening in her strength.  She has been seen at [facility] and was recommended to have conservative management.  She did not respond to epidural steroid injections.  She has had physical therapy.  She has previously had urinary and bowel issues.  There have been no acute changes or worsening noted in that standing  Conservative measures:  Physical therapy: has participated in PT at [facility]  Multimodal medical therapy including regular antiinflammatories: Flexeril, Prednisone, Tramadol , Oxycodone , Robaxin, Lyrica, Meloxicam Injections: epidural steroid injections with [facility]  Past Surgery: no spinal surgeries  The symptoms are causing a significant impact on the patient's life.   I have utilized the care everywhere function in epic to review the outside records available from external health systems.  Review of Systems:  A 10 point review of systems is negative, except for the pertinent positives and negatives detailed in the HPI.  Past Medical History: Past Medical History:  Diagnosis Date   Acute ethmoidal sinusitis    Allergy 1985   Anxiety    agoraphobia   Arthritis    osteo - everywhere   BRCA negative 12/2021   MyRisk neg; IBIS=6.0%/riskscore=2.8%   Cataract 2019   Chest pain    related to thyroid  issues   Dental infection 12/11/2014    Depression    Facial cellulitis    Family history of pancreatic cancer 12/2021   My Risk neg   GERD (gastroesophageal reflux disease)    Graves disease    Graves disease    Graves' disease    Graves' disease    Headache    sinus   Hereditary pancreatitis    Hyperlipidemia    Hypertension    Hypokalemia    Hypothyroid    Leukocytosis 12/11/2014   Lichen sclerosus of female genitalia    Mitral valve regurgitation    Orthostatic hypotension    PONV (postoperative nausea and vomiting)    SIRS (systemic inflammatory response syndrome) (HCC) 12/11/2014   Sleep apnea    supposed to use CPAP - no machine   Squamous cell carcinoma of skin 09/21/2019   left forehead above mid brow [facility])   Stress incontinence    Tricuspid valve regurgitation    Von Willebrand's disease (HCC)    Wears contact lenses     Past Surgical History: Past Surgical History:  Procedure Laterality Date   ABDOMINAL HYSTERECTOMY     APPENDECTOMY     BREAST EXCISIONAL BIOPSY Right    2014   BREAST SURGERY     CATARACT EXTRACTION W/PHACO Right 03/09/2018   Procedure: CATARACT EXTRACTION PHACO AND INTRAOCULAR LENS PLACEMENT (IOC) RIGHT IVA TOPICAL SYMFONY LENS;  Surgeon: Myrna Adine Anes, MD;  Location: [facility];  Service: Ophthalmology;  Laterality: Right;   CATARACT EXTRACTION W/PHACO Left 03/30/2018   Procedure: CATARACT EXTRACTION PHACO  AND INTRAOCULAR LENS PLACEMENT (IOC) SYMFONY LENS LEFT;  Surgeon: Myrna Adine Anes, MD;  Location: [facility];  Service: Ophthalmology;  Laterality: Left;   CHOLECYSTECTOMY     COSMETIC SURGERY     Blepheroplasty   EYE SURGERY     IMAGE GUIDED SINUS SURGERY Right 05/16/2015   Procedure: IMAGE GUIDED SINUS SURGERY, RIGHT ENDOSCOPIC MAXILLARY ANTROSTOMY, RIGHT ENDOSCOPIC ANTERIOR ETHMOIDECTOMY AND RIGHT ENDOSCOPIC FRONTAL RECESS EXPLORATION;  Surgeon: Deward Dolly, MD;  Location: [facility];  Service: ENT;  Laterality: Right;  GAVE DISK TO CE  CE   POLYPECTOMY      Allergies: Allergies as of 02/25/2024 - Review Complete 02/25/2024  Allergen Reaction Noted   Contrast media [iodinated contrast media] Hives and Nausea Only 12/11/2014   Iodine Other (See Comments) and Hives 03/26/2004    Medications:  Current Outpatient Medications:    amLODipine  (NORVASC ) 5 MG tablet, Take 1 tablet (5 mg total) by mouth daily., Disp: 90 tablet, Rfl: 3   aspirin  EC 81 MG tablet, Take by mouth., Disp: , Rfl:    azelastine (ASTELIN) 0.1 % nasal spray, Place into both nostrils., Disp: , Rfl:    cholecalciferol (VITAMIN D3) 25 MCG (1000 UNIT) tablet, Take 1,000 Units by mouth daily., Disp: , Rfl:    clobetasol  ointment (TEMOVATE ) 0.05 %, Apply to affected once weekly as maintenance, Disp: 45 g, Rfl: 0   esomeprazole  (NEXIUM ) 40 MG capsule, Take 1 capsule (40 mg total) by mouth daily., Disp: 90 capsule, Rfl: 3   fluticasone  (FLONASE ) 50 MCG/ACT nasal spray, Place 1-2 sprays into both nostrils daily., Disp: , Rfl:    indapamide  (LOZOL ) 1.25 MG tablet, Take 1 tablet (1.25 mg total) by mouth daily., Disp: 90 tablet, Rfl: 3   Magnesium  250 MG TABS, Take by mouth. On m-w-f, Disp: , Rfl:    nitroGLYCERIN (NITROSTAT) 0.4 MG SL tablet, nitroglycerin 0.4 mg sublingual tablet, Disp: , Rfl:    PARoxetine  (PAXIL ) 20 MG tablet, Take 1 tablet (20 mg total) by mouth daily., Disp: 90 tablet, Rfl: 3   potassium chloride  SA (KLOR-CON  M) 20 MEQ tablet, Take 1 tablet (20 mEq total) by mouth daily., Disp: 90 tablet, Rfl: 3   simvastatin  (ZOCOR ) 20 MG tablet, Take 1 tablet (20 mg total) by mouth at bedtime., Disp: 90 tablet, Rfl: 3  Social History: Social History   Tobacco Use   Smoking status: Every Day    Current packs/day: 0.00    Average packs/day: 0.8 packs/day for 25.0 years (18.8 ttl pk-yrs)    Types: Cigarettes    Start date: 12/08/1989    Last attempt to quit: 12/09/2014    Years since quitting: 9.2   Smokeless tobacco: Never  Vaping Use   Vaping status:  Never Used  Substance Use Topics   Alcohol use: No   Drug use: No    Family Medical History: Family History  Problem Relation Age of Onset   Pancreatic cancer Mother 15   Stroke Mother    Anxiety disorder Mother    Cancer Mother    Miscarriages / Stillbirths Mother    Varicose Veins Mother    Stroke Father    Cancer Brother        Cancer on Back   Lung cancer Brother        spine   Ovarian cancer Maternal Aunt 60   Hematuria Daughter    Heart disease Maternal Grandfather    Arthritis Paternal Grandmother    Hyperlipidemia Sister  Breast cancer Neg Hx     Physical Examination: Vitals:   02/25/24 1119  BP: 120/76    General: Patient is in no apparent distress. Attention to examination is appropriate.  Neck:   Supple.  Full range of motion.  Respiratory: Patient is breathing without any difficulty.   NEUROLOGICAL:     Awake, alert, oriented to person, place, and time.  Speech is clear and fluent.   Cranial Nerves: Pupils equal round and reactive to light.  Facial tone is symmetric.  Facial sensation is symmetric. Shoulder shrug is symmetric. Tongue protrusion is midline.    Strength: Her physical examination shows some giveaway in her right deltoid, she may have some mild intrinsic hand weakness bilaterally with finger abduction, however the rest of her tested musculature is strong at least 4+ to 5 out of 5.  Her reflexes were 2+ throughout, no evidence of pathologic reflexes noted.  Specifically no Hoffmann's Tromner or clonus present.    Patient is able to walk unassisted.  Imaging: I reviewed her MRI, report as below.  Shows multilevel spondylosis, worse at the lower aspect of her cervical spine with some excess cervical lordosis at this level, she does show some fishmouthing anteriorly, because of this I like to evaluate her with flexion-extension x-rays.  This result has an attachment that is not available. EXAM: Magnetic resonance imaging, spinal canal  and contents, cervical without contrast material. DATE: 12/29/2023 12:32 PM ACCESSION: [id number] UN DICTATED: 12/29/2023 12:41 PM INTERPRETATION LOCATION: [facility]  CLINICAL INDICATION: 58 years old Female with cervicalgia  - M54.2-Cervicalgia    COMPARISON: Cervical spine radiographs dated 08/23/2019.  TECHNIQUE: Multiplanar multisequence MRI was performed through the cervical spine without intravenous contrast.  FINDINGS:  Vertebral body hemangioma at C5. Focal fatty marrow change along the inferior endplate of C4. Bone marrow signal intensity is otherwise unremarkable.  Normal signal in the spinal cord.  Trace retrolisthesis at C5-C6. Trace anterolisthesis at C6-C7. The vertebral bodies are otherwise normally aligned.  Disc spaces are preserved.  C2-C3: No significant spinal canal or neuroforaminal narrowing.  C3-C4: No significant spinal canal or neuroforaminal narrowing.  C4-C5: No significant spinal canal or neuroforaminal narrowing.  C5-C6: Disc osteophyte complex with uncovertebral and facet hypertrophy. Mild spinal canal stenosis. Moderate right and mild left neuroforaminal narrowing.  C6-C7: Disc osteophyte complex asymmetric to the right with uncovertebral and facet hypertrophy. Mild spinal canal stenosis and moderate bilateral neuroforaminal narrowing, right greater than left.  C7-T1: No significant spinal canal or neuroforaminal narrowing. Bilateral perineural cysts.  No significant spinal canal or neuroforaminal stenosis in the imaged upper thoracic spine.  The paraspinal tissues are within normal limits. Procedure Note  Emma Rebecka Saha, MD - 12/29/2023 Formatting of this note might be different from the original. EXAM: Magnetic resonance imaging, spinal canal and contents, cervical without contrast material. DATE: 12/29/2023 12:32 PM ACCESSION: [id number] UN DICTATED: 12/29/2023 12:41 PM INTERPRETATION LOCATION: [facility]  CLINICAL  INDICATION: 58 years old Female with cervicalgia  - M54.2-Cervicalgia    COMPARISON: Cervical spine radiographs dated 08/23/2019.  TECHNIQUE: Multiplanar multisequence MRI was performed through the cervical spine without intravenous contrast.  FINDINGS:  Vertebral body hemangioma at C5. Focal fatty marrow change along the inferior endplate of C4. Bone marrow signal intensity is otherwise unremarkable.  Normal signal in the spinal cord.  Trace retrolisthesis at C5-C6. Trace anterolisthesis at C6-C7. The vertebral bodies are otherwise normally aligned.  Disc spaces are preserved.  C2-C3: No significant spinal canal or neuroforaminal narrowing.  C3-C4: No significant spinal canal or neuroforaminal narrowing.  C4-C5: No significant spinal canal or neuroforaminal narrowing.  C5-C6: Disc osteophyte complex with uncovertebral and facet hypertrophy. Mild spinal canal stenosis. Moderate right and mild left neuroforaminal narrowing.  C6-C7: Disc osteophyte complex asymmetric to the right with uncovertebral and facet hypertrophy. Mild spinal canal stenosis and moderate bilateral neuroforaminal narrowing, right greater than left.  C7-T1: No significant spinal canal or neuroforaminal narrowing. Bilateral perineural cysts.  No significant spinal canal or neuroforaminal stenosis in the imaged upper thoracic spine.  The paraspinal tissues are within normal limits.  IMPRESSION: Degenerative changes with mild spinal canal stenosis at C5-C7 and: *  Moderate right and mild left neuroforaminal stenosis at C5-C6. *  Moderate bilateral neuroforaminal stenosis at C6-C7. Exam End: 12/29/23 12:32   Specimen Collected: 12/29/23 12:41 Last Resulted: 12/29/23 13:48  Received From: [facility]  Result Received: 01/06/24 15:14    I have personally reviewed the images and agree with the above interpretation.  Medical Decision Making/Assessment and Plan: Ms. Dieu is a pleasant 57 y.o. female  with a chronic history of neck pain.  She states this is mostly in the midline.  It is worsened with being upright and being fatigued.  She has some intermittent arm pains but her most troublesome issue is her neck pain.  This is midline.  Sometimes it goes down but mostly stays in the midline aspect of her neck.  Physical exam demonstrates good strength throughout without any clear focal deficits.  She does not have any hyperreflexia or loss of reflexes. She does have positive pain with passive range of motion and active range of motion in the right shoulder.  I have her referred over to orthopedics to evaluate for any rotator cuff pathology.  When she has her x-rays read I will follow-up with her on any further plans.  We did discuss possible facet injections or referral for bony level injections rather than epidural injections given her lack of persistent radicular issues and most of her pain being axial neck issues.  Thank you for involving me in the care of this patient.    Penne MICAEL Sharps MD/MSCR Neurosurgery

## 2024-02-25 ENCOUNTER — Ambulatory Visit
Admission: RE | Admit: 2024-02-25 | Discharge: 2024-02-25 | Disposition: A | Discharge status: Home / Self Care | Source: Ambulatory Visit | Attending: Neurosurgery | Admitting: Neurosurgery

## 2024-02-25 ENCOUNTER — Ambulatory Visit
Admission: RE | Admit: 2024-02-25 | Discharge: 2024-02-25 | Disposition: A | Discharge status: Home / Self Care | Attending: Neurosurgery | Admitting: Neurosurgery

## 2024-02-25 ENCOUNTER — Encounter: Payer: Self-pay | Admitting: Neurosurgery

## 2024-02-25 ENCOUNTER — Ambulatory Visit: Admitting: Neurosurgery

## 2024-02-25 VITALS — BP 120/76 | Ht 70.0 in | Wt 220.5 lb

## 2024-02-25 DIAGNOSIS — M542 Cervicalgia: Secondary | ICD-10-CM

## 2024-02-25 DIAGNOSIS — M5412 Radiculopathy, cervical region: Secondary | ICD-10-CM | POA: Diagnosis present

## 2024-02-29 ENCOUNTER — Other Ambulatory Visit: Payer: Self-pay | Admitting: Neurosurgery

## 2024-02-29 ENCOUNTER — Ambulatory Visit: Payer: Self-pay | Admitting: Neurosurgery

## 2024-02-29 DIAGNOSIS — M5412 Radiculopathy, cervical region: Secondary | ICD-10-CM

## 2024-02-29 NOTE — Progress Notes (Signed)
 Placing orders for scoliosis x-rays given her excessive cervical lordosis at the low cervical level.  Evaluate for upper thoracic kyphosis or other deformity.  Plan was sent to patient via MyChart

## 2024-03-09 ENCOUNTER — Ambulatory Visit
Admission: RE | Admit: 2024-03-09 | Discharge: 2024-03-09 | Disposition: A | Discharge status: Home / Self Care | Source: Ambulatory Visit | Attending: Neurosurgery | Admitting: Neurosurgery

## 2024-03-09 ENCOUNTER — Ambulatory Visit
Admission: RE | Admit: 2024-03-09 | Discharge: 2024-03-09 | Disposition: A | Discharge status: Home / Self Care | Attending: Neurosurgery | Admitting: Neurosurgery

## 2024-03-09 DIAGNOSIS — M5412 Radiculopathy, cervical region: Secondary | ICD-10-CM | POA: Insufficient documentation

## 2024-03-10 ENCOUNTER — Other Ambulatory Visit: Payer: Self-pay

## 2024-03-10 DIAGNOSIS — E876 Hypokalemia: Secondary | ICD-10-CM

## 2024-03-10 NOTE — Telephone Encounter (Unsigned)
 Copied from CRM [id number]. Topic: Clinical - Medication Refill >> Mar 10, 2024  1:06 PM Robinson H wrote: Medication: potassium chloride  SA (KLOR-CON  M) 20 MEQ tablet  Has the patient contacted their pharmacy? Yes, needs prescription (Agent: If no, request that the patient contact the pharmacy for the refill. If patient does not wish to contact the pharmacy document the reason why and proceed with request.) (Agent: If yes, when and what did the pharmacy advise?)  This is the patient's preferred pharmacy:  [facility] - [place], [place] - [address] [place] [address] Phone: [phone] Fax: [phone]    Is this the correct pharmacy for this prescription? Yes If no, delete pharmacy and type the correct one.   Has the prescription been filled recently? No  Is the patient out of the medication? No  Has the patient been seen for an appointment in the last year OR does the patient have an upcoming appointment? Yes  Can we respond through MyChart? Yes  Agent: Please be advised that Rx refills may take up to 3 business days. We ask that you follow-up with your pharmacy.

## 2024-03-11 MED ORDER — POTASSIUM CHLORIDE CRYS ER 20 MEQ PO TBCR: 20.0000 meq | EXTENDED_RELEASE_TABLET | Freq: Every day | ORAL | 0 refills | Status: DC

## 2024-03-16 ENCOUNTER — Ambulatory Visit: Payer: Self-pay | Admitting: Neurosurgery

## 2024-03-16 ENCOUNTER — Telehealth: Payer: Self-pay

## 2024-03-16 NOTE — Telephone Encounter (Signed)
 Copied from CRM [id number]. Topic: General - Call Back - No Documentation >> Mar 15, 2024  4:39 PM Rea C wrote: Reason for CRM: Patient called in to confirm that her appointment in October is a transfer of care appointment and that it would not be cancelled. Patient saw that her previous appt was cancelled. I explained that the previous appt for the 29th was scheduled incorrectly and that is why she was rescheduled to October for TOC. Patient said she was told that it was cancelled due to the time being 20 minutes. Patient would like a phone call or Mychart message confirming that her transfer of care appointment in October is correct.   [phone]

## 2024-03-17 ENCOUNTER — Ambulatory Visit
Admission: RE | Admit: 2024-03-17 | Discharge: 2024-03-17 | Disposition: A | Discharge status: Home / Self Care | Attending: Orthopedic Surgery | Admitting: Orthopedic Surgery

## 2024-03-17 ENCOUNTER — Telehealth: Payer: Self-pay

## 2024-03-17 ENCOUNTER — Ambulatory Visit
Admission: RE | Admit: 2024-03-17 | Discharge: 2024-03-17 | Disposition: A | Discharge status: Home / Self Care | Source: Ambulatory Visit | Attending: Orthopedic Surgery | Admitting: Orthopedic Surgery

## 2024-03-17 DIAGNOSIS — G8929 Other chronic pain: Secondary | ICD-10-CM

## 2024-03-17 DIAGNOSIS — M25511 Pain in right shoulder: Secondary | ICD-10-CM | POA: Diagnosis present

## 2024-03-17 NOTE — Telephone Encounter (Signed)
 Spoke to patient she is going to get xrays today orders are in

## 2024-03-18 ENCOUNTER — Ambulatory Visit (INDEPENDENT_AMBULATORY_CARE_PROVIDER_SITE_OTHER): Admitting: Orthopedic Surgery

## 2024-03-18 VITALS — BP 129/86 | Ht 70.0 in | Wt 221.0 lb

## 2024-03-18 DIAGNOSIS — G8929 Other chronic pain: Secondary | ICD-10-CM

## 2024-03-18 DIAGNOSIS — M25511 Pain in right shoulder: Secondary | ICD-10-CM

## 2024-03-18 MED ORDER — METHYLPREDNISOLONE ACETATE 40 MG/ML IJ SUSP
40.0000 mg | Freq: Once | INTRAMUSCULAR | Status: AC
  Administered 2024-03-18: 40 mg via INTRAMUSCULAR

## 2024-03-18 NOTE — Patient Instructions (Signed)
 Instructions Following Joint Injections  In clinic today, you received an injection in one of your joints (sometimes more than one).  Occasionally, you can have some pain at the injection site, this is normal.  You can place ice at the injection site, or take over-the-counter medications such as Tylenol (acetaminophen) or Advil (ibuprofen).  Please follow all directions listed on the bottle.  If your joint (knee or shoulder) becomes swollen, red or very painful, please contact the clinic for additional assistance.   Two medications were injected, including lidocaine and a steroid (often referred to as cortisone).  Lidocaine is effective almost immediately but wears off quickly.  However, the steroid can take a few days to improve your symptoms.  In some cases, it can make your pain worse for a couple of days.  Do not be concerned if this happens as it is common.  You can apply ice or take some over-the-counter medications as needed.   Injections in the same joint cannot be repeated for 3 months.  This helps to limit the risk of an infection in the joint.  If you were to develop an infection in your joint, the best treatment option would be surgery.    Rotator Cuff Tear/Tendinitis Rehab   Ask your health care provider which exercises are safe for you. Do exercises exactly as told by your health care provider and adjust them as directed. It is normal to feel mild stretching, pulling, tightness, or discomfort as you do these exercises. Stop right away if you feel sudden pain or your pain gets worse. Do not begin these exercises until told by your health care provider. Stretching and range-of-motion exercises  These exercises warm up your muscles and joints and improve the movement and flexibility of your shoulder. These exercises also help to relieve pain.  Shoulder pendulum In this exercise, you let the injured arm dangle toward the floor and then swing it like a clock pendulum. Stand near a table  or counter that you can hold onto for balance. Bend forward at the waist and let your left / right arm hang straight down. Use your other arm to support you and help you stay balanced. Relax your left / right arm and shoulder muscles, and move your hips and your trunk so your left / right arm swings freely. Your arm should swing because of the motion of your body, not because you are using your arm or shoulder muscles. Keep moving your hips and trunk so your arm swings in the following directions, as told by your health care provider: Side to side. Forward and backward. In clockwise and counterclockwise circles. Slowly return to the starting position. Repeat 10 times, or for 10 seconds per direction. Complete this exercise 2-3 times a day.      Shoulder flexion, seated This exercise is sometimes called table slides. In this exercise, you raise your arm in front of your body until you feel a stretch in your injured shoulder. Sit in a stable chair so your left / right forearm can rest on a flat surface. Your elbow should rest at a height that keeps your upper arm next to your body. Keeping your left / right shoulder relaxed, lean forward at the waist and let your hand slide forward (flexion). Stop when you feel a stretch in your shoulder, or when you reach the angle that is recommended by your health care provider. Hold for 5 seconds. Slowly return to the starting position. Repeat 10 times. Complete this  exercise 1-2  times a day.       Shoulder flexion, standing In this exercise, you raise your arm in front of your body (flexion) until you feel a stretch in your injured shoulder. Stand and hold a broomstick, a cane, or a similar object. Place your hands a little more than shoulder-width apart on the object. Your left / right hand should be palm-up, and your other hand should be palm-down. Keep your elbow straight and your shoulder muscles relaxed. Push the stick up with your healthy arm to raise  your left / right arm in front of your body, and then over your head until you feel a stretch in your shoulder. Avoid shrugging your shoulder while you raise your arm. Keep your shoulder blade tucked down toward the middle of your back. Keep your left / right shoulder muscles relaxed. Hold for 10 seconds. Slowly return to the starting position. Repeat 10 times. Complete this exercise 1-2 times a day.      Shoulder abduction, active-assisted You will need a stick, broom handle, or similar object to help you (assist) in doing this exercise. Lie on your back. This is the supine position. Hold a broomstick, a cane, or a similar object. Place your hands a little more than shoulder-width apart on the object. Your left / right hand should be palm-up, and your other hand should be palm-down. Keeping your shoulder relaxed, push the stick to raise your left / right arm out to your side (abduction) and then over your head. Use your other hand to help move the stick. Stop when you feel a stretch in your shoulder, or when you reach the angle that is recommended by your health care provider. Avoid shrugging your shoulder while you raise your arm. Keep your shoulder blade tucked down toward the middle of your back. Hold for 10 seconds. Slowly return to the starting position. Repeat 10 times. Complete this exercise 1-2 times a day.      Shoulder flexion, active-assisted Lie on your back. You may bend your knees for comfort. Hold a broomstick, a cane, or a similar object so that your hands are about shoulder-width apart. Your palms should face toward your feet. Raise your left / right arm over your head, then behind your head toward the floor (flexion). Use your other hand to help you do this (active-assisted). Stop when you feel a gentle stretch in your shoulder, or when you reach the angle that is recommended by your health care provider. Hold for 10 seconds. Use the stick and your other arm to help you  return your left / right arm to the starting position. Repeat 10 times. Complete this exercise 1-2 times a day.      External rotation Sit in a stable chair without armrests, or stand up. Tuck a soft object, such as a folded towel or a small ball, under your left / right upper arm. Hold a broomstick, a cane, or a similar object with your palms face-down, toward the floor. Bend your elbows to a 90-degree angle (right angle), and keep your hands about shoulder-width apart. Straighten your healthy arm and push the stick across your body, toward your left / right side. Keep your left / right arm bent. This will rotate your left / right forearm away from your body (external rotation). Hold for 10 seconds. Slowly return to the starting position. Repeat 10 times. Complete this exercise 1-2 times a day.        Strengthening exercises  These exercises build strength and endurance in your shoulder. Endurance is the ability to use your muscles for a long time, even after they get tired. Do not start doing these exercises until your health care provider approves. Shoulder flexion, isometric Stand or sit in a doorway, facing the door frame. Keep your left / right arm straight and make a gentle fist with your hand. Place your fist against the door frame. Only your fist should be touching the frame. Keep your upper arm at your side. Gently press your fist against the door frame, as if you are trying to raise your arm above your head (isometric shoulder flexion). Avoid shrugging your shoulder while you press your hand into the door frame. Keep your shoulder blade tucked down toward the middle of your back. Hold for 10 seconds. Slowly release the tension, and relax your muscles completely before you repeat the exercise. Repeat 10 times. Complete this exercise 3 times per week.      Shoulder abduction, isometric Stand or sit in a doorway. Your left / right arm should be closest to the door frame. Keep your  left / right arm straight, and place the back of your hand against the door frame. Only your hand should be touching the frame. Keep the rest of your arm close to your side. Gently press the back of your hand against the door frame, as if you are trying to raise your arm out to the side (isometric shoulder abduction). Avoid shrugging your shoulder while you press your hand into the door frame. Keep your shoulder blade tucked down toward the middle of your back. Hold for 10 seconds. Slowly release the tension, and relax your muscles completely before you repeat the exercise. Repeat 10 times. Complete this exercise 3 times per week.      Internal rotation, isometric This is an exercise in which you press your palm against a door frame without moving your shoulder joint (isometric). Stand or sit in a doorway, facing the door frame. Bend your left / right elbow, and place the palm of your hand against the door frame. Only your palm should be touching the frame. Keep your upper arm at your side. Gently press your hand against the door frame, as if you are trying to push your arm toward your abdomen (internal rotation). Gradually increase the pressure until you are pressing as hard as you can. Stop increasing the pressure if you feel shoulder pain. Avoid shrugging your shoulder while you press your hand into the door frame. Keep your shoulder blade tucked down toward the middle of your back. Hold for 10 seconds. Slowly release the tension, and relax your muscles completely before you repeat the exercise. Repeat 10 times. Complete this exercise 3 times per week.      External rotation, isometric This is an exercise in which you press the back of your wrist against a door frame without moving your shoulder joint (isometric). Stand or sit in a doorway, facing the door frame. Bend your left / right elbow and place the back of your wrist against the door frame. Only the back of your wrist should be  touching the frame. Keep your upper arm at your side. Gently press your wrist against the door frame, as if you are trying to push your arm away from your abdomen (external rotation). Gradually increase the pressure until you are pressing as hard as you can. Stop increasing the pressure if you feel pain. Avoid shrugging your shoulder while  you press your wrist into the door frame. Keep your shoulder blade tucked down toward the middle of your back. Hold for 10 seconds. Slowly release the tension, and relax your muscles completely before you repeat the exercise. Repeat 10 times. Complete this exercise 3 times per week.       Scapular retraction Sit in a stable chair without armrests, or stand up. Secure an exercise band to a stable object in front of you so the band is at shoulder height. Hold one end of the exercise band in each hand. Your palms should face down. Squeeze your shoulder blades together (retraction) and move your elbows slightly behind you. Do not shrug your shoulders upward while you do this. Hold for 10 seconds. Slowly return to the starting position. Repeat 10 times. Complete this exercise 3 times per week.      Shoulder extension Sit in a stable chair without armrests, or stand up. Secure an exercise band to a stable object in front of you so the band is above shoulder height. Hold one end of the exercise band in each hand. Straighten your elbows and lift your hands up to shoulder height. Squeeze your shoulder blades together and pull your hands down to the sides of your thighs (extension). Stop when your hands are straight down by your sides. Do not let your hands go behind your body. Hold for 10 seconds. Slowly return to the starting position. Repeat 10 times. Complete this exercise 3 times per week.       Scapular protraction, supine Lie on your back on a firm surface (supine position). Hold a 5 lbs (or soup can) weight in your left / right hand. Raise your left  / right arm straight into the air so your hand is directly above your shoulder joint. Push the weight into the air so your shoulder (scapula) lifts off the surface that you are lying on. The scapula will push up or forward (protraction). Do not move your head, neck, or back. Hold for 10 seconds. Slowly return to the starting position. Let your muscles relax completely before you repeat this exercise.  Repeat 10 times. Complete this exercise 3 times per week.

## 2024-03-19 ENCOUNTER — Encounter: Payer: Self-pay | Admitting: Orthopedic Surgery

## 2024-03-19 NOTE — Progress Notes (Signed)
 New Patient Visit  Assessment: Emma Barker is a 58 y.o. female with the following: 1. Chronic right shoulder pain  Plan: MEGGEN SPAZIANI has pain in her right shoulder.  No specific injury.  It is progressively worsening.  Pain is in the posterior lateral aspect the shoulder, radiating distally.  We discussed proceeding with an injection.  This was completed in clinic today.  She will return to clinic as needed.  Procedure note injection - Right shoulder    Verbal consent was obtained to inject the right shoulder, subacromial space Timeout was completed to confirm the site of injection.   The skin was prepped with alcohol and ethyl chloride was sprayed at the injection site.  A 21-gauge needle was used to inject 40 mg of Depo-Medrol  and 1% lidocaine  (4 cc) into the subacromial space of the right shoulder using a posterolateral approach.  There were no complications.  A sterile bandage was applied.    Follow-up: Return if symptoms worsen or fail to improve.  Subjective:  Chief Complaint  Patient presents with   Shoulder Pain    Pain in the right shoulder for 4 months and has became worse      History of Present Illness: Emma Barker is a 58 y.o. female who presents for evaluation of right shoulder pain.  She reports that she has had pain in the right shoulder for the past 4 months.  No specific injury.  It is progressively worsening.  It affects her ability to sleep.  She is difficulty with overhead motion.  Medications have not been effective.  No prior injections.   Review of Systems: No fevers or chills No numbness or tingling No chest pain No shortness of breath No bowel or bladder dysfunction No GI distress No headaches   Medical History:  Past Medical History:  Diagnosis Date   Acute ethmoidal sinusitis    Allergy 1985   Anxiety    agoraphobia   Arthritis    osteo - everywhere   BRCA negative 12/2021   MyRisk neg; IBIS=6.0%/riskscore=2.8%    Cataract 2019   Chest pain    related to thyroid  issues   Dental infection 12/11/2014   Depression    Facial cellulitis    Family history of pancreatic cancer 12/2021   My Risk neg   GERD (gastroesophageal reflux disease)    Graves disease    Graves disease    Graves' disease    Graves' disease    Headache    sinus   Hereditary pancreatitis    Hyperlipidemia    Hypertension    Hypokalemia    Hypothyroid    Leukocytosis 12/11/2014   Lichen sclerosus of female genitalia    Mitral valve regurgitation    Orthostatic hypotension    PONV (postoperative nausea and vomiting)    SIRS (systemic inflammatory response syndrome) (HCC) 12/11/2014   Sleep apnea    supposed to use CPAP - no machine   Squamous cell carcinoma of skin 09/21/2019   left forehead above mid brow [facility])   Stress incontinence    Tricuspid valve regurgitation    Von Willebrand's disease (HCC)    Wears contact lenses     Past Surgical History:  Procedure Laterality Date   ABDOMINAL HYSTERECTOMY     APPENDECTOMY     BREAST EXCISIONAL BIOPSY Right    2014   BREAST SURGERY     CATARACT EXTRACTION W/PHACO Right 03/09/2018   Procedure: CATARACT EXTRACTION PHACO AND INTRAOCULAR LENS PLACEMENT (  IOC) RIGHT IVA TOPICAL SYMFONY LENS;  Surgeon: Myrna Adine Anes, MD;  Location: [facility];  Service: Ophthalmology;  Laterality: Right;   CATARACT EXTRACTION W/PHACO Left 03/30/2018   Procedure: CATARACT EXTRACTION PHACO AND INTRAOCULAR LENS PLACEMENT (IOC) SYMFONY LENS LEFT;  Surgeon: Myrna Adine Anes, MD;  Location: [facility];  Service: Ophthalmology;  Laterality: Left;   CHOLECYSTECTOMY     COSMETIC SURGERY     Blepheroplasty   EYE SURGERY     IMAGE GUIDED SINUS SURGERY Right 05/16/2015   Procedure: IMAGE GUIDED SINUS SURGERY, RIGHT ENDOSCOPIC MAXILLARY ANTROSTOMY, RIGHT ENDOSCOPIC ANTERIOR ETHMOIDECTOMY AND RIGHT ENDOSCOPIC FRONTAL RECESS EXPLORATION;  Surgeon: Deward Dolly, MD;  Location: [facility];  Service: ENT;  Laterality: Right;  GAVE DISK TO CE CE   POLYPECTOMY      Family History  Problem Relation Age of Onset   Pancreatic cancer Mother 51   Stroke Mother    Anxiety disorder Mother    Cancer Mother    Miscarriages / Stillbirths Mother    Varicose Veins Mother    Stroke Father    Cancer Brother        Cancer on Back   Lung cancer Brother        spine   Ovarian cancer Maternal Aunt 60   Hematuria Daughter    Heart disease Maternal Grandfather    Arthritis Paternal Grandmother    Hyperlipidemia Sister    Breast cancer Neg Hx    Social History   Tobacco Use   Smoking status: Every Day    Current packs/day: 0.00    Average packs/day: 0.8 packs/day for 25.0 years (18.8 ttl pk-yrs)    Types: Cigarettes    Start date: 12/08/1989    Last attempt to quit: 12/09/2014    Years since quitting: 9.2   Smokeless tobacco: Never  Vaping Use   Vaping status: Never Used  Substance Use Topics   Alcohol use: No   Drug use: No    Allergies  Allergen Reactions   Contrast Media [Iodinated Contrast Media] Hives and Nausea Only   Iodine Other (See Comments) and Hives    Current Meds  Medication Sig   amLODipine  (NORVASC ) 5 MG tablet Take 1 tablet (5 mg total) by mouth daily.   aspirin  EC 81 MG tablet Take by mouth.   azelastine (ASTELIN) 0.1 % nasal spray Place into both nostrils.   cholecalciferol (VITAMIN D3) 25 MCG (1000 UNIT) tablet Take 1,000 Units by mouth daily.   clobetasol  ointment (TEMOVATE ) 0.05 % Apply to affected once weekly as maintenance   esomeprazole  (NEXIUM ) 40 MG capsule Take 1 capsule (40 mg total) by mouth daily.   fluticasone  (FLONASE ) 50 MCG/ACT nasal spray Place 1-2 sprays into both nostrils daily.   indapamide  (LOZOL ) 1.25 MG tablet Take 1 tablet (1.25 mg total) by mouth daily.   Magnesium  250 MG TABS Take by mouth. On m-w-f   nitroGLYCERIN (NITROSTAT) 0.4 MG SL tablet nitroglycerin 0.4 mg sublingual tablet   PARoxetine  (PAXIL ) 20 MG  tablet Take 1 tablet (20 mg total) by mouth daily.   potassium chloride  SA (KLOR-CON  M) 20 MEQ tablet Take 1 tablet (20 mEq total) by mouth daily.   simvastatin  (ZOCOR ) 20 MG tablet Take 1 tablet (20 mg total) by mouth at bedtime.    Objective: BP 129/86   Ht 5' 10 (1.778 m)   Wt 221 lb (100.2 kg)   BMI 31.71 kg/m   Physical Exam:  General: Alert and oriented.  and No acute distress. Gait: Normal gait.  Right shoulder without deformity.  Near full range of motion.  Pain at extremes.  Tenderness to palpation over the lateral aspect of the shoulder.  Positive Jobes.  Negative belly press.  Fingers are warm well-perfused.  IMAGING: I personally ordered and reviewed the following images  X-ray of the right shoulder were obtained.  No acute injuries.  Minimal degenerative change.  No evidence of proximal humeral migration.   New Medications:  Meds ordered this encounter  Medications   methylPREDNISolone  acetate (DEPO-MEDROL ) injection 40 mg      Oneil DELENA Horde, MD  03/19/2024 12:09 PM

## 2024-03-23 ENCOUNTER — Ambulatory Visit: Admitting: Nurse Practitioner

## 2024-04-07 NOTE — Progress Notes (Unsigned)
 Referring Physician:  No referring provider defined for this encounter.  Primary Physician:  Pcp, No  History of Present Illness: 04/13/2024 [name] is here today with a chief complaint of scoliosis who presents with arm numbness and shoulder pain. She was referred by Dr. Claudene for evaluation of her scoliosis and arm numbness.  Numbness began in the entire arm, initially severe, with some relief from a Medrol  Dose Pack. Numbness, especially in the fingers, returned, with the pinky most affected. Symptoms worsened, primarily affecting fingertips. An epidural injection in May did not alleviate symptoms. Physical therapy provided some relief for neck pain but not for arm symptoms. Current symptoms are improved from her worst but numbness persists.  A shoulder injection provided temporary relief, but pain has returned. Intermittent mid-back pain occurs, especially after prolonged walking or certain postures. Legs fall asleep easily, but there is no current forearm pain.     02/25/2024 Notes from Dr Claudene [name] is here today with a chief complaint of chronic neck pain.  She has had neck pain for at least the past 30 years, has been worsening over the past year and exacerbated in the last 3 months.  She states that her pain is worse in the midline neck.  Sometimes it goes down her arm but that is much less common.  She feels like her neck is often feeling tight and uncomfortable.  She gets intermittent numbness and tingling.  Does not notice severe worsening in her strength.  She has been seen at [facility] and was recommended to have conservative management.  She did not respond to epidural steroid injections.  She has had physical therapy.  She has previously had urinary and bowel issues.  There have been no acute changes or worsening noted in that standing   Conservative measures:  Physical therapy: has participated in PT at [facility]  Multimodal medical therapy  including regular antiinflammatories: Flexeril, Prednisone, Tramadol , Oxycodone , Robaxin, Lyrica, Meloxicam Injections: epidural steroid injections with [facility]   Past Surgery: no spinal surgerie   Review of Systems:  A 10 point review of systems is negative, except for the pertinent positives and negatives detailed in the HPI.  Past Medical History: Past Medical History:  Diagnosis Date   Acute ethmoidal sinusitis    Allergy 1985   Anxiety    agoraphobia   Arthritis    osteo - everywhere   BRCA negative 12/2021   MyRisk neg; IBIS=6.0%/riskscore=2.8%   Cataract 2019   Chest pain    related to thyroid  issues   Dental infection 12/11/2014   Depression    Facial cellulitis    Family history of pancreatic cancer 12/2021   My Risk neg   GERD (gastroesophageal reflux disease)    Graves disease    Graves disease    Graves' disease    Graves' disease    Headache    sinus   Hereditary pancreatitis    Hyperlipidemia    Hypertension    Hypokalemia    Hypothyroid    Leukocytosis 12/11/2014   Lichen sclerosus of female genitalia    Mitral valve regurgitation    Orthostatic hypotension    PONV (postoperative nausea and vomiting)    SIRS (systemic inflammatory response syndrome) (HCC) 12/11/2014   Sleep apnea    supposed to use CPAP - no machine   Squamous cell carcinoma of skin 09/21/2019   left forehead above mid brow [facility])   Stress incontinence    Tricuspid valve regurgitation  Von Willebrand's disease [facility])    Wears contact lenses     Past Surgical History: Past Surgical History:  Procedure Laterality Date   ABDOMINAL HYSTERECTOMY     APPENDECTOMY     BREAST EXCISIONAL BIOPSY Right    2014   BREAST SURGERY     CATARACT EXTRACTION W/PHACO Right 03/09/2018   Procedure: CATARACT EXTRACTION PHACO AND INTRAOCULAR LENS PLACEMENT (IOC) RIGHT IVA TOPICAL SYMFONY LENS;  Surgeon: Myrna Adine Anes, MD;  Location: [facility];  Service: Ophthalmology;   Laterality: Right;   CATARACT EXTRACTION W/PHACO Left 03/30/2018   Procedure: CATARACT EXTRACTION PHACO AND INTRAOCULAR LENS PLACEMENT (IOC) SYMFONY LENS LEFT;  Surgeon: Myrna Adine Anes, MD;  Location: [facility];  Service: Ophthalmology;  Laterality: Left;   CHOLECYSTECTOMY     COSMETIC SURGERY     Blepheroplasty   EYE SURGERY     IMAGE GUIDED SINUS SURGERY Right 05/16/2015   Procedure: IMAGE GUIDED SINUS SURGERY, RIGHT ENDOSCOPIC MAXILLARY ANTROSTOMY, RIGHT ENDOSCOPIC ANTERIOR ETHMOIDECTOMY AND RIGHT ENDOSCOPIC FRONTAL RECESS EXPLORATION;  Surgeon: Deward Dolly, MD;  Location: [facility];  Service: ENT;  Laterality: Right;  GAVE DISK TO CE CE   POLYPECTOMY      Allergies: Allergies as of 04/13/2024 - Review Complete 04/13/2024  Allergen Reaction Noted   Contrast media [iodinated contrast media] Hives and Nausea Only 12/11/2014   Iodine Other (See Comments) and Hives 03/26/2004    Medications:  Current Outpatient Medications:    amLODipine  (NORVASC ) 5 MG tablet, Take 1 tablet (5 mg total) by mouth daily., Disp: 90 tablet, Rfl: 3   aspirin  EC 81 MG tablet, Take by mouth., Disp: , Rfl:    azelastine (ASTELIN) 0.1 % nasal spray, Place into both nostrils., Disp: , Rfl:    cholecalciferol (VITAMIN D3) 25 MCG (1000 UNIT) tablet, Take 1,000 Units by mouth daily., Disp: , Rfl:    clobetasol  ointment (TEMOVATE ) 0.05 %, Apply to affected once weekly as maintenance, Disp: 45 g, Rfl: 0   esomeprazole  (NEXIUM ) 40 MG capsule, Take 1 capsule (40 mg total) by mouth daily., Disp: 90 capsule, Rfl: 3   fluticasone  (FLONASE ) 50 MCG/ACT nasal spray, Place 1-2 sprays into both nostrils daily., Disp: , Rfl:    indapamide  (LOZOL ) 1.25 MG tablet, Take 1 tablet (1.25 mg total) by mouth daily., Disp: 90 tablet, Rfl: 3   Magnesium  250 MG TABS, Take by mouth. On m-w-f, Disp: , Rfl:    nitroGLYCERIN (NITROSTAT) 0.4 MG SL tablet, nitroglycerin 0.4 mg sublingual tablet, Disp: , Rfl:     PARoxetine  (PAXIL ) 20 MG tablet, Take 1 tablet (20 mg total) by mouth daily., Disp: 90 tablet, Rfl: 3   potassium chloride  SA (KLOR-CON  M) 20 MEQ tablet, Take 1 tablet (20 mEq total) by mouth daily., Disp: 90 tablet, Rfl: 0   simvastatin  (ZOCOR ) 20 MG tablet, Take 1 tablet (20 mg total) by mouth at bedtime., Disp: 90 tablet, Rfl: 3  Social History: Social History   Tobacco Use   Smoking status: Every Day    Current packs/day: 0.00    Average packs/day: 0.8 packs/day for 25.0 years (18.8 ttl pk-yrs)    Types: Cigarettes    Start date: 12/08/1989    Last attempt to quit: 12/09/2014    Years since quitting: 9.3   Smokeless tobacco: Never  Vaping Use   Vaping status: Never Used  Substance Use Topics   Alcohol use: No   Drug use: No    Family Medical History: Family History  Problem Relation Age of Onset   Pancreatic cancer Mother 12   Stroke Mother    Anxiety disorder Mother    Cancer Mother    Miscarriages / Stillbirths Mother    Varicose Veins Mother    Stroke Father    Cancer Brother        Cancer on Back   Lung cancer Brother        spine   Ovarian cancer Maternal Aunt 60   Hematuria Daughter    Heart disease Maternal Grandfather    Arthritis Paternal Grandmother    Hyperlipidemia Sister    Breast cancer Neg Hx     Physical Examination: Vitals:   04/13/24 1520  BP: 136/88    General: Patient is in no apparent distress. Attention to examination is appropriate.  Neck:   Supple.  Full range of motion.  Respiratory: Patient is breathing without any difficulty.   NEUROLOGICAL:     Awake, alert, oriented to person, place, and time.  Speech is clear and fluent.   Cranial Nerves: Pupils equal round and reactive to light.  Facial tone is symmetric.  Facial sensation is symmetric. Shoulder shrug is symmetric. Tongue protrusion is midline.  There is no pronator drift.  Strength: Side Biceps Triceps Deltoid Interossei Grip Wrist Ext. Wrist Flex.  R 5 5 5 5 5 5 5    L 5 5 5 5 5 5 5    Side Iliopsoas Quads Hamstring PF DF EHL  R 5 5 5 5 5 5   L 5 5 5 5 5 5    Reflexes are 1+ and symmetric at the biceps, triceps, brachioradialis, patella and achilles.   Hoffman's is absent.   Bilateral upper and lower extremity sensation is intact to light touch.    No evidence of dysmetria noted.  Gait is normal.     Medical Decision Making  Imaging: Scoliosis Xrays 03/09/2024 FINDINGS: Less than 5 degree long dextrocurvature of the thoracic spine. Approximate 13 degree levoscoliosis of the lumbar spine from superior endplate T12 to inferior endplate L4. No congenital vertebral anomalies. Sagittal image demonstrates kyphosis of the thoracic spine. Mild wedging of T5 through T9 vertebral bodies. Lumbar lordosis and straightening of cervical lordosis.   IMPRESSION: 1. Less than 5 degree long dextrocurvature of the thoracic spine. Approximate 13 degree levoscoliosis of the lumbar spine. 2. Kyphosis of the thoracic spine with mild wedging of T5 through T9 vertebral bodies.     Electronically Signed   By: Luke Bun M.D.   On: 03/12/2024 16:58  MRI C spine 12/29/2023 Degenerative changes with mild spinal canal stenosis at C5-C7 and:  *  Moderate right and mild left neuroforaminal stenosis at C5-C6.  *  Moderate bilateral neuroforaminal stenosis at C6-C7.   I have personally reviewed the images and agree with the above interpretation.  Assessment and Plan: Ms. Lafavor is a pleasant 58 y.o. female with cervical radiculopathy that is currently improved.   Cervical radiculopathy with arm and hand numbness Persistent numbness in fingertips, especially pinky. Symptoms improved since initial episode. Previous treatments ineffective. Surgery not recommended as numbness alone is not an indication. - Encourage staying active. - Consider future interventions if symptoms worsen significantly.  Right shoulder pain, likely glenohumeral joint disorder Right  shoulder pain improved with joint injection. Pain recurred but not severe. Injection response suggests shoulder component.  Thoracic kyphosis with mid-back pain Intermittent mid-back pain with activities. Pain not significantly interfering with daily life. Surgical intervention not recommended. Smoking increases nonunion risk in  spinal surgeries. - Encourage staying active. - Advise smoking cessation to improve overall health and potential surgical outcomes. - Return to clinic in 1 year with scoliosis x-rays  I spent a total of 40 minutes in this patient's care today. This time was spent reviewing pertinent records including imaging studies, obtaining and confirming history, performing a directed evaluation, formulating and discussing my recommendations, and documenting the visit within the medical record.     Thank you for involving me in the care of this patient.      Gena Laski K. Clois MD, [facility]

## 2024-04-08 ENCOUNTER — Ambulatory Visit: Admitting: Family Medicine

## 2024-04-08 ENCOUNTER — Ambulatory Visit: Admitting: Neurosurgery

## 2024-04-13 ENCOUNTER — Encounter: Payer: Self-pay | Admitting: Neurosurgery

## 2024-04-13 ENCOUNTER — Ambulatory Visit (INDEPENDENT_AMBULATORY_CARE_PROVIDER_SITE_OTHER): Admitting: Neurosurgery

## 2024-04-13 VITALS — BP 136/88 | Ht 70.0 in | Wt 221.0 lb

## 2024-04-13 DIAGNOSIS — M40204 Unspecified kyphosis, thoracic region: Secondary | ICD-10-CM | POA: Diagnosis not present

## 2024-04-13 DIAGNOSIS — M5412 Radiculopathy, cervical region: Secondary | ICD-10-CM | POA: Diagnosis not present

## 2024-04-13 DIAGNOSIS — M25511 Pain in right shoulder: Secondary | ICD-10-CM | POA: Diagnosis not present

## 2024-04-13 DIAGNOSIS — M419 Scoliosis, unspecified: Secondary | ICD-10-CM

## 2024-06-09 ENCOUNTER — Ambulatory Visit: Admitting: Nurse Practitioner

## 2024-06-09 ENCOUNTER — Encounter: Payer: Self-pay | Admitting: Nurse Practitioner

## 2024-06-09 VITALS — BP 130/80 | HR 71 | Temp 98.1°F | Ht 70.0 in | Wt 222.8 lb

## 2024-06-09 DIAGNOSIS — I10 Essential (primary) hypertension: Secondary | ICD-10-CM

## 2024-06-09 DIAGNOSIS — K219 Gastro-esophageal reflux disease without esophagitis: Secondary | ICD-10-CM

## 2024-06-09 DIAGNOSIS — Z23 Encounter for immunization: Secondary | ICD-10-CM | POA: Diagnosis not present

## 2024-06-09 DIAGNOSIS — F32A Depression, unspecified: Secondary | ICD-10-CM | POA: Diagnosis not present

## 2024-06-09 DIAGNOSIS — F419 Anxiety disorder, unspecified: Secondary | ICD-10-CM

## 2024-06-09 DIAGNOSIS — H6991 Unspecified Eustachian tube disorder, right ear: Secondary | ICD-10-CM

## 2024-06-09 DIAGNOSIS — R600 Localized edema: Secondary | ICD-10-CM | POA: Diagnosis not present

## 2024-06-09 DIAGNOSIS — E669 Obesity, unspecified: Secondary | ICD-10-CM | POA: Diagnosis not present

## 2024-06-09 DIAGNOSIS — E785 Hyperlipidemia, unspecified: Secondary | ICD-10-CM

## 2024-06-09 DIAGNOSIS — R1011 Right upper quadrant pain: Secondary | ICD-10-CM | POA: Diagnosis not present

## 2024-06-09 DIAGNOSIS — G4730 Sleep apnea, unspecified: Secondary | ICD-10-CM

## 2024-06-09 MED ORDER — SERTRALINE HCL 50 MG PO TABS: 50.0000 mg | ORAL_TABLET | Freq: Every day | ORAL | 0 refills | Status: DC

## 2024-06-09 NOTE — Assessment & Plan Note (Signed)
 Chronic GERD. Continue Nexium  and discuss dietary modifications and weight loss.

## 2024-06-09 NOTE — Assessment & Plan Note (Signed)
 Check A1c. Encourage healthy diet and regular exercise. Counseled on health benefits of weight loss.

## 2024-06-09 NOTE — Assessment & Plan Note (Signed)
 Blood pressure is adequately controlled on amlodipine  and indapamide . She does report dizziness with standing and bending over as well as an increase in lower extremity edema. She will continue her current medication regimen. Referral placed to cardiology for additional cardiac work up. Return precautions given to patient. Encourage home monitoring.

## 2024-06-09 NOTE — Assessment & Plan Note (Signed)
 Chronic anxiety with panic attacks and inadequate response to current medication. Switch from Paxil  to Zoloft 50 mg daily at bedtime. Counseled patient on common side effects. Encouraged to contact if worsening symptoms, unusual behavior changes or suicidal thoughts occur. Follow up in 4 weeks to assess effectiveness.

## 2024-06-09 NOTE — Assessment & Plan Note (Signed)
 Chronic pain with intermittent nausea raises concerns about liver function and possible abdominal fluid retention. Order liver function tests and lipase levels. Review previous imaging studies. Further work up pending.

## 2024-06-09 NOTE — Assessment & Plan Note (Signed)
 Non-compliant with CPAP due to discomfort. Encourage retrying CPAP with gradual adjustment.

## 2024-06-09 NOTE — Assessment & Plan Note (Signed)
 Managed with Simvastatin . Continue.

## 2024-06-09 NOTE — Assessment & Plan Note (Addendum)
 Persistent edema, possibly due to amlodipine , with no recent cardiology follow-up. No edema noted today on exam. Refer to cardiology for evaluation and possible echocardiogram. Consider switching from amlodipine  to losartan .

## 2024-06-09 NOTE — Progress Notes (Signed)
 Leron Glance, NP-C Phone: [phone]  Emma Barker is a 58 y.o. female who presents today for transfer of care.   Discussed the use of AI scribe software for clinical note transcription with the patient, who gave verbal consent to proceed.  History of Present Illness   Emma Barker is a 58 year old female with hypertension and anxiety who presents for transfer of care with ear fullness and abdominal pain and swelling.  She experiences a sensation of fullness in her right ear, describing it as 'hollow' when tapping on the back of her head. This has persisted for a couple of months and is accompanied by constant itching in both ears. There is no pain, but the sensation is bothersome. She occasionally uses nasal spray and has been using Flonase  and azelastine nasal spray.  She reports swelling in her abdomen, ankles, and feet, which worsens as the day progresses. The swelling became noticeable after a long car trip in June and, although less severe now, persists daily. She denies high salt intake and has a history of fluid around her heart and tricuspid valve regurgitation.  She experiences dizziness and lightheadedness, particularly when bending over or standing up, and has a history of blood pressure issues. She has a history of blood pressure issues and currently takes amlodipine , indapamide , and potassium. She previously took losartan  with HCTZ and did not require potassium supplementation.  She has a history of chronic hematuria for 20-30 years, with previous workups including scans. No burning during urination, increased frequency, or blood in her urine beyond the chronic hematuria.  She reports chronic abdominal pain, particularly in the right upper quadrant, and has a history of pancreatitis. She takes Nexium  for acid reflux and has a hiatal hernia. She experiences nausea and occasional vomiting into her throat.  She has a history of anxiety and depression, previously treated  with Effexor  and currently on Paxil . Paxil  has not been effective, particularly for her anxiety, which includes panic attacks when driving on the interstate and agoraphobia. She is concerned about side effects when discontinuing medications.  She reports poor sleep quality and excessive daytime sleepiness, attributing it to sleep apnea. She has a CPAP machine but finds it uncomfortable and has not been using it regularly. She feels suffocated by the machine and has tried various masks without success.      Social History   Tobacco Use  Smoking Status Every Day   Current packs/day: 0.00   Average packs/day: 0.8 packs/day for 25.0 years (18.8 ttl pk-yrs)   Types: Cigarettes   Start date: 12/08/1989   Last attempt to quit: 12/09/2014   Years since quitting: 9.5  Smokeless Tobacco Never    Current Outpatient Medications on File Prior to Visit  Medication Sig Dispense Refill   amLODipine  (NORVASC ) 5 MG tablet Take 1 tablet (5 mg total) by mouth daily. 90 tablet 3   aspirin  EC 81 MG tablet Take by mouth.     azelastine (ASTELIN) 0.1 % nasal spray Place into both nostrils.     cholecalciferol (VITAMIN D3) 25 MCG (1000 UNIT) tablet Take 1,000 Units by mouth daily.     clobetasol  ointment (TEMOVATE ) 0.05 % Apply to affected once weekly as maintenance 45 g 0   esomeprazole  (NEXIUM ) 40 MG capsule Take 1 capsule (40 mg total) by mouth daily. 90 capsule 3   fluticasone  (FLONASE ) 50 MCG/ACT nasal spray Place 1-2 sprays into both nostrils daily.     indapamide  (LOZOL ) 1.25 MG tablet Take 1  tablet (1.25 mg total) by mouth daily. 90 tablet 3   Magnesium  250 MG TABS Take by mouth. On m-w-f     nitroGLYCERIN (NITROSTAT) 0.4 MG SL tablet nitroglycerin 0.4 mg sublingual tablet     potassium chloride  SA (KLOR-CON  M) 20 MEQ tablet Take 1 tablet (20 mEq total) by mouth daily. 90 tablet 0   simvastatin  (ZOCOR ) 20 MG tablet Take 1 tablet (20 mg total) by mouth at bedtime. 90 tablet 3   No current  facility-administered medications on file prior to visit.     ROS see history of present illness  Objective  Physical Exam Vitals:   06/09/24 1349  BP: 130/80  Pulse: 71  Temp: 98.1 F (36.7 C)  SpO2: 97%    BP Readings from Last 3 Encounters:  06/09/24 130/80  04/13/24 136/88  03/18/24 129/86   Wt Readings from Last 3 Encounters:  06/09/24 222 lb 12.8 oz (101.1 kg)  04/13/24 221 lb (100.2 kg)  03/18/24 221 lb (100.2 kg)    Physical Exam Constitutional:      General: She is not in acute distress.    Appearance: Normal appearance.  HENT:     Head: Normocephalic.     Right Ear: Tympanic membrane normal.     Left Ear: Tympanic membrane normal.  Cardiovascular:     Rate and Rhythm: Normal rate and regular rhythm.     Heart sounds: Normal heart sounds.  Pulmonary:     Effort: Pulmonary effort is normal.     Breath sounds: Normal breath sounds.  Abdominal:     General: Abdomen is flat. Bowel sounds are normal.     Palpations: Abdomen is soft.     Tenderness: There is abdominal tenderness in the right upper quadrant. There is no guarding.  Musculoskeletal:     Right lower leg: No edema.     Left lower leg: No edema.  Skin:    General: Skin is warm and dry.  Neurological:     General: No focal deficit present.     Mental Status: She is alert.  Psychiatric:        Mood and Affect: Mood normal.        Behavior: Behavior normal.     Assessment/Plan: Please see individual problem list.  Right upper quadrant abdominal pain Assessment & Plan: Chronic pain with intermittent nausea raises concerns about liver function and possible abdominal fluid retention. Order liver function tests and lipase levels. Review previous imaging studies. Further work up pending.   Orders: -     CBC with Differential/Platelet -     Comprehensive metabolic panel with GFR -     Lipase  Anxiety and depression Assessment & Plan: Chronic anxiety with panic attacks and inadequate  response to current medication. Switch from Paxil  to Zoloft 50 mg daily at bedtime. Counseled patient on common side effects. Encouraged to contact if worsening symptoms, unusual behavior changes or suicidal thoughts occur. Follow up in 4 weeks to assess effectiveness.   Orders: -     TSH -     VITAMIN D  25 Hydroxy (Vit-D Deficiency, Fractures) -     Sertraline HCl; Take 1 tablet (50 mg total) by mouth at bedtime.  Dispense: 90 tablet; Refill: 0  Essential (primary) hypertension Assessment & Plan: Blood pressure is adequately controlled on amlodipine  and indapamide . She does report dizziness with standing and bending over as well as an increase in lower extremity edema. She will continue her current medication regimen. Referral placed  to cardiology for additional cardiac work up. Return precautions given to patient. Encourage home monitoring.   Orders: -     Ambulatory referral to Cardiology  Bilateral lower extremity edema Assessment & Plan: Persistent edema, possibly due to amlodipine , with no recent cardiology follow-up. No edema noted today on exam. Refer to cardiology for evaluation and possible echocardiogram. Consider switching from amlodipine  to losartan .  Orders: -     Ambulatory referral to Cardiology  Dysfunction of right eustachian tube Assessment & Plan: Chronic right ear fullness and itching likely due to fluid accumulation, possibly allergy-related. Use Flonase  nasal spray, two sprays each side daily, and an oral antihistamine such as Zyrtec. Return precautions given to patient.    Gastro-esophageal reflux disease without esophagitis Assessment & Plan: Chronic GERD. Continue Nexium  and discuss dietary modifications and weight loss.   Hyperlipidemia, unspecified hyperlipidemia type Assessment & Plan: Managed with Simvastatin . Continue.    Sleep apnea, unspecified type Assessment & Plan: Non-compliant with CPAP due to discomfort. Encourage retrying CPAP with gradual  adjustment.   Obesity (BMI 30-39.9) Assessment & Plan: Check A1c. Encourage healthy diet and regular exercise. Counseled on health benefits of weight loss.   Orders: -     Hemoglobin A1c  Need for influenza vaccination -     Flu vaccine, recombinant, trivalent, inj      Return in about 4 weeks (around 07/07/2024) for Anxiety/Depression.   Leron Glance, NP-C [facility]

## 2024-06-09 NOTE — Assessment & Plan Note (Signed)
 Chronic right ear fullness and itching likely due to fluid accumulation, possibly allergy-related. Use Flonase  nasal spray, two sprays each side daily, and an oral antihistamine such as Zyrtec. Return precautions given to patient.

## 2024-06-10 ENCOUNTER — Ambulatory Visit

## 2024-06-10 ENCOUNTER — Ambulatory Visit: Payer: Self-pay | Admitting: Nurse Practitioner

## 2024-06-10 VITALS — BP 118/74 | HR 78 | Ht 70.0 in | Wt 223.0 lb

## 2024-06-10 DIAGNOSIS — R072 Precordial pain: Secondary | ICD-10-CM

## 2024-06-10 DIAGNOSIS — I3139 Other pericardial effusion (noninflammatory): Secondary | ICD-10-CM

## 2024-06-10 DIAGNOSIS — R0609 Other forms of dyspnea: Secondary | ICD-10-CM | POA: Diagnosis not present

## 2024-06-10 DIAGNOSIS — R600 Localized edema: Secondary | ICD-10-CM | POA: Diagnosis not present

## 2024-06-10 DIAGNOSIS — I951 Orthostatic hypotension: Secondary | ICD-10-CM | POA: Diagnosis not present

## 2024-06-10 DIAGNOSIS — Z79899 Other long term (current) drug therapy: Secondary | ICD-10-CM

## 2024-06-10 DIAGNOSIS — Z91041 Radiographic dye allergy status: Secondary | ICD-10-CM

## 2024-06-10 DIAGNOSIS — R1011 Right upper quadrant pain: Secondary | ICD-10-CM

## 2024-06-10 DIAGNOSIS — R748 Abnormal levels of other serum enzymes: Secondary | ICD-10-CM

## 2024-06-10 LAB — CBC WITH DIFFERENTIAL/PLATELET
Basophils Absolute: 0.1 K/uL (ref 0.0–0.1)
Basophils Relative: 1.4 % (ref 0.0–3.0)
Eosinophils Absolute: 0.2 K/uL (ref 0.0–0.7)
Eosinophils Relative: 2 % (ref 0.0–5.0)
HCT: 46 % (ref 36.0–46.0)
Hemoglobin: 15.6 g/dL — ABNORMAL HIGH (ref 12.0–15.0)
Lymphocytes Relative: 26.7 % (ref 12.0–46.0)
Lymphs Abs: 2.4 K/uL (ref 0.7–4.0)
MCHC: 33.8 g/dL (ref 30.0–36.0)
MCV: 92.7 fl (ref 78.0–100.0)
Monocytes Absolute: 0.6 K/uL (ref 0.1–1.0)
Monocytes Relative: 7.2 % (ref 3.0–12.0)
Neutro Abs: 5.6 K/uL (ref 1.4–7.7)
Neutrophils Relative %: 62.7 % (ref 43.0–77.0)
Platelets: 294 K/uL (ref 150.0–400.0)
RBC: 4.96 Mil/uL (ref 3.87–5.11)
RDW: 13.9 % (ref 11.5–15.5)
WBC: 8.9 K/uL (ref 4.0–10.5)

## 2024-06-10 LAB — TSH: TSH: 3.84 u[IU]/mL (ref 0.35–5.50)

## 2024-06-10 LAB — COMPREHENSIVE METABOLIC PANEL WITH GFR
ALT: 23 U/L (ref 0–35)
AST: 19 U/L (ref 0–37)
Albumin: 4.7 g/dL (ref 3.5–5.2)
Alkaline Phosphatase: 65 U/L (ref 39–117)
BUN: 12 mg/dL (ref 6–23)
CO2: 32 meq/L (ref 19–32)
Calcium: 9.8 mg/dL (ref 8.4–10.5)
Chloride: 102 meq/L (ref 96–112)
Creatinine, Ser: 0.94 mg/dL (ref 0.40–1.20)
GFR: 66.36 mL/min (ref >60.00)
Glucose, Bld: 118 mg/dL — ABNORMAL HIGH (ref 70–99)
Potassium: 3.5 meq/L (ref 3.5–5.1)
Sodium: 145 meq/L (ref 135–145)
Total Bilirubin: 0.9 mg/dL (ref 0.2–1.2)
Total Protein: 7 g/dL (ref 6.0–8.3)

## 2024-06-10 LAB — LIPASE: Lipase: 101 U/L — ABNORMAL HIGH (ref 11.0–59.0)

## 2024-06-10 LAB — VITAMIN D 25 HYDROXY (VIT D DEFICIENCY, FRACTURES): VITD: 34.07 ng/mL (ref 30.00–100.00)

## 2024-06-10 LAB — HEMOGLOBIN A1C: Hgb A1c MFr Bld: 5.9 % (ref 4.6–6.5)

## 2024-06-10 MED ORDER — DIPHENHYDRAMINE HCL 50 MG PO TABS: 50.0000 mg | ORAL_TABLET | Freq: Once | ORAL | 0 refills | Status: DC

## 2024-06-10 MED ORDER — METOPROLOL TARTRATE 50 MG PO TABS: ORAL_TABLET | ORAL | 0 refills | Status: DC

## 2024-06-10 MED ORDER — METOPROLOL TARTRATE 100 MG PO TABS: 100.0000 mg | ORAL_TABLET | Freq: Two times a day (BID) | ORAL | 3 refills | Status: DC

## 2024-06-10 MED ORDER — PREDNISONE 50 MG PO TABS: ORAL_TABLET | ORAL | 0 refills | Status: DC

## 2024-06-10 MED ORDER — METOPROLOL TARTRATE 100 MG PO TABS: 100.0000 mg | ORAL_TABLET | Freq: Once | ORAL | 0 refills | Status: DC

## 2024-06-10 NOTE — Progress Notes (Signed)
  Cardiology Office Note   Date:  06/10/2024  ID:  FERGIE SHERBERT, DOB Aug 18, 1966, MRN [id number] PCP: Gretel App, NP  [facility] Providers Cardiologist:  Caron Poser, MD     History of Present Illness Emma Barker is a 58 y.o. female PMH HTN, HLD who presents for further evaluation management of lower extremity edema.  Patient reports that she has noticed lower extremity edema recently that seems to be worse than before.  She also reports DOE.  She denies any chest discomfort.  She does have orthostatic hypotension which has been tricky to manage.  She reports a prior echocardiogram that showed a pericardial effusion.  She denies any significant syncope, palpitations, orthopnea.  Last LDL 142 01/2023.  Relevant CVD History - Reports a prior echocardiogram showed a small pericardial effusion; records not available for review   ROS: Pt denies any chest discomfort, jaw pain, arm pain, palpitations, syncope, presyncope, orthopnea, PND.  Studies Reviewed I have independently reviewed the patient's ECG, recent blood work, previous medical record.  Physical Exam VS:  BP 118/74 (BP Location: Right Arm, Cuff Size: Large)   Pulse 78   Ht 5' 10 (1.778 m)   Wt 223 lb (101.2 kg)   SpO2 97%   BMI 32.00 kg/m   Orthostatic VS for the past 24 hrs (Last 3 readings):  BP- Lying Pulse- Lying BP- Sitting Pulse- Sitting BP- Standing at 0 minutes Pulse- Standing at 0 minutes BP- Standing at 3 minutes Pulse- Standing at 3 minutes  06/10/24 0814 138/82 80 122/75 80 106/64 84 93/62 92      Wt Readings from Last 3 Encounters:  06/10/24 223 lb (101.2 kg)  06/09/24 222 lb 12.8 oz (101.1 kg)  04/13/24 221 lb (100.2 kg)    GEN: No acute distress. NECK: No JVD; No carotid bruits. CARDIAC: RRR, no murmurs, rubs, gallops. RESPIRATORY:  Clear to auscultation. EXTREMITIES:  Warm and well-perfused. No edema.  ASSESSMENT AND PLAN Lower extremity edema Abnormal ECG DOE History of  reported pericardial effusion Patient presents with undifferentiated dyspnea and lower extremity edema.  Her ECG shows nonspecific ST-T changes in the lateral leads and inferior Q waves.  There is also some electrical alternans present.  She does not have orthopnea, but does report some symptoms of right sided heart failure with increased abdominal girth and lower extremity edema.  On exam today, she appears euvolemic and does not have any edema.  She also notes a history of small pericardial effusion on a prior echo.  She is normotensive and not tachycardic with a normal pulse pressure, but does have significant orthostatic hypotension.  Overall, this needs further evaluation.  Plan: - Echocardiogram to evaluate for any structural causes of symptoms including pericardial effusion given report of an effusion on a prior echocardiogram which I cannot see - Previous smoker, so we will obtain coronary CT angiogram to evaluate for dyspnea as anginal equivalent.  She has a reported contrast allergy, so we will proceed with her standard premedication protocol.  Orthostatic hypotension As above, systolic blood pressure drop of 29 points with standing.  We will need to rule out structural/cardiac causes first.  I would keep her medications the same for now.  If the above testing does not show a clear, intervenable etiology, then we can reassess management strategies at her follow-up.        Dispo: RTC 3 months  Signed, Caron Poser, MD

## 2024-06-10 NOTE — Patient Instructions (Addendum)
 Medication Instructions:  Your physician recommends that you continue on your current medications as directed. Please refer to the Current Medication list given to you today.  *If you need a refill on your cardiac medications before your next appointment, please call your pharmacy*  Lab Work: Your provider would like for you to have following labs drawn today BMET.   If you have labs (blood work) drawn today and your tests are completely normal, you will receive your results only by: MyChart Message (if you have MyChart) OR A paper copy in the mail If you have any lab test that is abnormal or we need to change your treatment, we will call you to review the results.  Testing/Procedures: Your physician has requested that you have an echocardiogram. Echocardiography is a painless test that uses sound waves to create images of your heart. It provides your doctor with information about the size and shape of your heart and how well your heart's chambers and valves are working.   You may receive an ultrasound enhancing agent through an IV if needed to better visualize your heart during the echo. This procedure takes approximately one hour.  There are no restrictions for this procedure.  This will take place at [address] [facility], [place] [address]  Please note: We ask at that you not bring children with you during ultrasound (echo/ vascular) testing. Due to room size and safety concerns, children are not allowed in the ultrasound rooms during exams. Our front office staff cannot provide observation of children in our lobby area while testing is being conducted. An adult accompanying a patient to their appointment will only be allowed in the ultrasound room at the discretion of the ultrasound technician under special circumstances. We apologize for any inconvenience.     Your cardiac CT will be scheduled at one of the below locations:   [facility] [address] [place], [place] [address] [phone]  Please arrive 15 mins early for check-in and test prep.  There is spacious parking Copy available) and easy access to the radiology department from the [facility] Entrance. Please enter here and check-in with the desk attendant.   Please follow these instructions carefully (unless otherwise directed):  An IV will be required for this test and Nitroglycerin will be given.    On the Night Before the Test: Be sure to Drink plenty of water. Do not consume any caffeinated/decaffeinated beverages or chocolate 12 hours prior to your test. Do not take any antihistamines 12 hours prior to your test. If the patient has contrast allergy: Patient will need a prescription for Prednisone and very clear instructions (as follows): Prednisone 50 mg - take 13 hours prior to test Take another Prednisone 50 mg 7 hours prior to test Take another Prednisone 50 mg 1 hour prior to test Take Benadryl  50 mg 1 hour prior to test Patient must complete all four doses of above prophylactic medications. Patient will need a ride after test due to Benadryl .  On the Day of the Test: Drink plenty of water until 1 hour prior to the test. Do not eat any food 1 hour prior to test. You may take your regular medications prior to the test.  Take metoprolol (Lopressor) 100 mg two hours prior to test. FEMALES- please wear underwire-free bra if available, avoid dresses & tight clothing       After the Test: Drink plenty of water. After receiving IV contrast,  you may experience a mild flushed feeling. This is normal. On occasion, you may experience a mild rash up to 24 hours after the test. This is not dangerous. If this occurs, you can take Benadryl  25 mg, Zyrtec, Claritin, or Allegra and increase your fluid intake. (Patients taking Tikosyn should avoid Benadryl , and may take Zyrtec, Claritin, or Allegra) If you experience trouble  breathing, this can be serious. If it is severe call 911 IMMEDIATELY. If it is mild, please call our office.  We will call to schedule your test 2-4 weeks out understanding that some insurance companies will need an authorization prior to the service being performed.   For more information and frequently asked questions, please visit our website : [email or web address]  For non-scheduling related questions, please contact the cardiac imaging nurse navigator should you have any questions/concerns: Cardiac Imaging Nurse Navigators Direct Office Dial: [phone]   For scheduling needs, including cancellations and rescheduling, please call [place], [phone].    Follow-Up: At [facility], you and your health needs are our priority.  As part of our continuing mission to provide you with exceptional heart care, our providers are all part of one team.  This team includes your primary Cardiologist (physician) and Advanced Practice Providers or APPs (Physician Assistants and Nurse Practitioners) who all work together to provide you with the care you need, when you need it.  Your next appointment:   3 month(s)  Provider:   Caron Poser, MD   We recommend signing up for the patient portal called MyChart.  Sign up information is provided on this After Visit Summary.  MyChart is used to connect with patients for Virtual Visits (Telemedicine).  Patients are able to view lab/test results, encounter notes, upcoming appointments, etc.  Non-urgent messages can be sent to your provider as well.   To learn more about what you can do with MyChart, go to [email or web address].

## 2024-06-11 ENCOUNTER — Ambulatory Visit: Payer: Self-pay

## 2024-06-11 LAB — BASIC METABOLIC PANEL WITH GFR
BUN/Creatinine Ratio: 12 (ref 9–23)
BUN: 12 mg/dL (ref 6–24)
CO2: 24 mmol/L (ref 20–29)
Calcium: 9.9 mg/dL (ref 8.7–10.2)
Chloride: 102 mmol/L (ref 96–106)
Creatinine, Ser: 1 mg/dL (ref 0.57–1.00)
Glucose: 109 mg/dL — ABNORMAL HIGH (ref 70–99)
Potassium: 3.7 mmol/L (ref 3.5–5.2)
Sodium: 144 mmol/L (ref 134–144)
eGFR: 65 mL/min/1.73 (ref >59)

## 2024-06-18 MED ORDER — METOPROLOL TARTRATE 50 MG PO TABS: 50.0000 mg | ORAL_TABLET | Freq: Once | ORAL | 0 refills | Status: DC

## 2024-06-21 ENCOUNTER — Encounter: Payer: Self-pay | Admitting: Nurse Practitioner

## 2024-06-24 ENCOUNTER — Ambulatory Visit

## 2024-06-24 ENCOUNTER — Other Ambulatory Visit: Payer: Self-pay | Admitting: Nurse Practitioner

## 2024-06-24 DIAGNOSIS — Z91041 Radiographic dye allergy status: Secondary | ICD-10-CM

## 2024-06-24 MED ORDER — PREDNISONE 50 MG PO TABS
ORAL_TABLET | ORAL | 0 refills | Status: DC
Start: 2024-06-24 — End: 2024-07-07

## 2024-06-28 ENCOUNTER — Encounter: Payer: Self-pay | Admitting: Radiology

## 2024-06-28 ENCOUNTER — Ambulatory Visit

## 2024-06-28 DIAGNOSIS — L219 Seborrheic dermatitis, unspecified: Secondary | ICD-10-CM

## 2024-06-28 DIAGNOSIS — Z85828 Personal history of other malignant neoplasm of skin: Secondary | ICD-10-CM | POA: Diagnosis not present

## 2024-06-28 DIAGNOSIS — L578 Other skin changes due to chronic exposure to nonionizing radiation: Secondary | ICD-10-CM

## 2024-06-28 DIAGNOSIS — L814 Other melanin hyperpigmentation: Secondary | ICD-10-CM

## 2024-06-28 DIAGNOSIS — C4490 Unspecified malignant neoplasm of skin, unspecified: Secondary | ICD-10-CM

## 2024-06-28 MED ORDER — TACROLIMUS 0.1 % EX OINT: TOPICAL_OINTMENT | CUTANEOUS | 5 refills | Status: DC

## 2024-06-28 NOTE — Patient Instructions (Signed)

## 2024-06-28 NOTE — Progress Notes (Signed)
    Subjective   Emma Barker is a 58 y.o. female who presents for the following: Lesion(s) of concern . Patient is established patient   Today patient reports: Area of concern on the left eyebrow.  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   Review of Systems:    No other skin or systemic complaints except as noted in HPI or Assessment and Plan.  The following portions of the chart were reviewed this encounter and updated as appropriate: medications, allergies, medical history  Relevant Medical History:  Personal history of non melanoma skin cancer - see medical history for full details   Objective  Well appearing patient in no apparent distress; mood and affect are within normal limits. Examination was performed of the: Sun Exposed Exam: Scalp, head, eyes, ears, nose, lips, neck, upper extremities, hands, fingers, fingernails  Examination notable for: Actinic Damage/Elastosis: chronic sun damage: dyspigmentation, telangiectasia, and wrinkling Well healed scar on the left eyebrow - Mild Erythema and scaling of the eyebrows  .     Assessment & Plan  ACTINIC DAMAGE - Chronic condition, secondary to cumulative UV/sun exposure - Recommend daily broad spectrum sunscreen SPF 30+ to sun-exposed areas, reapply every 2 hours as needed.  - Staying in the shade or wearing long sleeves, sun glasses (UVA+UVB protection) and wide brim hats (4-inch brim around the entire circumference of the hat) are also recommended for sun protection.  - Call for new or changing lesions.  Personal history of non melanoma skin cancer  Well healed scar on the left eyebrow from MOHS treatment for SCC in 2021.  - Reviewed medical history for full details  - Reviewed sun protective measures as above - Encouraged full body skin exams    - discussed no evidence of recurrence today   Seborrheic dermatitis - mild of eyebrows  Chronic and persistent condition with duration or  expected duration over one year. Condition is symptomatic and bothersome to patient. Patient is flaring and not currently at treatment goal.  - Discussed diagnosis, typical course, and treatment options for this condition - Explained to the patient the chronic nature of this diagnosis Start tacrolimus ointment 0.1% twice daily on affected areas    Procedures, orders, diagnosis for this visit:    There are no diagnoses linked to this encounter.  Return to clinic: Return for Appointment as scheduled.  I, Emerick Ege, CMA am acting as scribe for Lauraine JAYSON Kanaris, MD.    Documentation: I have reviewed the above documentation for accuracy and completeness, and I agree with the above.  Lauraine JAYSON Kanaris, MD

## 2024-06-30 ENCOUNTER — Telehealth (HOSPITAL_COMMUNITY): Payer: Self-pay | Admitting: *Deleted

## 2024-06-30 NOTE — Telephone Encounter (Signed)
Reaching out to patient to offer assistance regarding upcoming cardiac imaging study; pt verbalizes understanding of appt date/time, parking situation and where to check in, pre-test NPO status and medications ordered, and verified current allergies; name and call back number provided for further questions should they arise  Gordy Clement RN [facility] [phone] office [phone] cell  Patient verbalized understanding of how to take 13 hour prep.

## 2024-07-01 ENCOUNTER — Other Ambulatory Visit: Payer: Self-pay | Admitting: Emergency Medicine

## 2024-07-01 ENCOUNTER — Ambulatory Visit
Admission: RE | Admit: 2024-07-01 | Discharge: 2024-07-01 | Disposition: A | Discharge status: Home / Self Care | Source: Ambulatory Visit

## 2024-07-01 DIAGNOSIS — R072 Precordial pain: Secondary | ICD-10-CM | POA: Diagnosis present

## 2024-07-01 DIAGNOSIS — I951 Orthostatic hypotension: Secondary | ICD-10-CM | POA: Diagnosis present

## 2024-07-01 DIAGNOSIS — E785 Hyperlipidemia, unspecified: Secondary | ICD-10-CM

## 2024-07-01 DIAGNOSIS — R0609 Other forms of dyspnea: Secondary | ICD-10-CM | POA: Insufficient documentation

## 2024-07-01 MED ORDER — NITROGLYCERIN 0.4 MG SL SUBL
0.8000 mg | SUBLINGUAL_TABLET | Freq: Once | SUBLINGUAL | Status: AC
  Administered 2024-07-01: 0.8 mg via SUBLINGUAL
  Filled 2024-07-01: qty 25

## 2024-07-01 MED ORDER — IOHEXOL 350 MG/ML SOLN
100.0000 mL | Freq: Once | INTRAVENOUS | Status: AC | PRN
  Administered 2024-07-01: 100 mL via INTRAVENOUS

## 2024-07-01 MED ORDER — METOPROLOL TARTRATE 5 MG/5ML IV SOLN: 10.0000 mg | Freq: Once | INTRAVENOUS | Status: DC | PRN

## 2024-07-01 MED ORDER — DILTIAZEM HCL 25 MG/5ML IV SOLN: 10.0000 mg | INTRAVENOUS | Status: DC | PRN

## 2024-07-01 NOTE — Progress Notes (Signed)
 Patient tolerated CT well. Vital signs stable encourage to drink water throughout day.Reasons explained and verbalized understanding. Ambulated steady gait.

## 2024-07-07 ENCOUNTER — Ambulatory Visit: Admitting: Nurse Practitioner

## 2024-07-07 ENCOUNTER — Encounter: Payer: Self-pay | Admitting: Nurse Practitioner

## 2024-07-07 VITALS — BP 116/86 | HR 94 | Temp 98.2°F | Ht 70.0 in | Wt 223.2 lb

## 2024-07-07 DIAGNOSIS — R1011 Right upper quadrant pain: Secondary | ICD-10-CM | POA: Diagnosis not present

## 2024-07-07 DIAGNOSIS — M2550 Pain in unspecified joint: Secondary | ICD-10-CM

## 2024-07-07 DIAGNOSIS — F32A Depression, unspecified: Secondary | ICD-10-CM

## 2024-07-07 DIAGNOSIS — R748 Abnormal levels of other serum enzymes: Secondary | ICD-10-CM | POA: Diagnosis not present

## 2024-07-07 DIAGNOSIS — F419 Anxiety disorder, unspecified: Secondary | ICD-10-CM | POA: Diagnosis not present

## 2024-07-07 DIAGNOSIS — H6991 Unspecified Eustachian tube disorder, right ear: Secondary | ICD-10-CM

## 2024-07-07 DIAGNOSIS — E785 Hyperlipidemia, unspecified: Secondary | ICD-10-CM

## 2024-07-07 MED ORDER — SERTRALINE HCL 50 MG PO TABS: 50.0000 mg | ORAL_TABLET | Freq: Every day | ORAL | 2 refills | Status: AC

## 2024-07-07 MED ORDER — ROSUVASTATIN CALCIUM 10 MG PO TABS: 10.0000 mg | ORAL_TABLET | Freq: Every day | ORAL | 3 refills | Status: AC

## 2024-07-07 MED ORDER — AZELASTINE HCL 0.1 % NA SOLN: 2.0000 | Freq: Two times a day (BID) | NASAL | 5 refills | Status: AC

## 2024-07-07 NOTE — Assessment & Plan Note (Addendum)
 Experiencing intermittent abdominal pain, varying in intensity, with elevated lipase. Awaiting CT abdomen results for further evaluation. Proceed with CT abdomen on November 14th, 2025. Consider referral to GI based on CT results.

## 2024-07-07 NOTE — Assessment & Plan Note (Signed)
 Switch from pravastatin to rosuvastatin 10 mg daily. LDL goal is under 70 mg/dL. Encouraged to contact if experiencing side effects. Future lab orders in with cardiology for lipid panel, encouraged to complete 6 weeks after being on rosuvastatin. We will continue to monitor.

## 2024-07-07 NOTE — Progress Notes (Signed)
 Leron Glance, NP-C Phone: [phone]  Emma Barker is a 58 y.o. female who presents today for follow up.   Discussed the use of AI scribe software for clinical note transcription with the patient, who gave verbal consent to proceed.  History of Present Illness   Emma Barker is a 58 year old female with depression and coronary artery disease who presents for a four-week follow-up after medication changes.  She has experienced improvements in mood and energy levels since switching from Paxil  to Zoloft. She wakes up earlier and engages in more activities, describing her mood as better and appreciating the return of her 'little attitude'.  She recently underwent a cardiology consultation where a coronary artery calcium score was performed. She continues on statin and aspirin  therapy but has been inconsistent with simvastatin  due to leg pain. She has started taking it more frequently but ran out and needs a prescription. She has tried atorvastatin in the past but did not tolerate it well.  She is scheduled for a CT abdomen in two days due to elevated lipase levels and a history of abdominal pain. 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 Recently, she experienced severe stomach pain that required her to sleep in a recliner. The pain is described as severe stomach pain, accompanied by body-wide pain, including back, joint, and bone pain. She has not made an appointment for these pains as they tend to resolve by the time she can see a doctor.  She has a history of osteoarthritis, which runs in her family, with her grandmother having had severe osteoarthritis. She started experiencing symptoms in her thirties or forties. Anti-inflammatories help but are used sparingly due to stomach issues. She uses topical treatments like Federal-mogul and [place].  She has not been tested for autoimmune conditions like rheumatoid arthritis and lupus in the past. She experiences polyarthralgia,  affecting her hands, knees, hips, and shoulders.  She is mindful of her diet, avoiding onions and garlic due to intolerance, and consumes a diet rich in vegetables and fruits.      Social History   Tobacco Use  Smoking Status Every Day   Current packs/day: 0.00   Average packs/day: 0.8 packs/day for 31.2 years (25.0 ttl pk-yrs)   Types: Cigarettes   Start date: 12/08/1989   Last attempt to quit: 12/09/2014   Years since quitting: 9.5  Smokeless Tobacco Never    Current Outpatient Medications on File Prior to Visit  Medication Sig Dispense Refill   amLODipine  (NORVASC ) 5 MG tablet Take 1 tablet (5 mg total) by mouth daily. 90 tablet 3   aspirin  EC 81 MG tablet Take by mouth.     cholecalciferol (VITAMIN D3) 25 MCG (1000 UNIT) tablet Take 1,000 Units by mouth daily.     clobetasol  ointment (TEMOVATE ) 0.05 % Apply to affected once weekly as maintenance 45 g 0   diphenhydrAMINE  (BENADRYL ) 50 MG tablet Take 1 tablet (50 mg total) by mouth once for 1 dose. 1 hour prior to test 1 tablet 0   esomeprazole  (NEXIUM ) 40 MG capsule Take 1 capsule (40 mg total) by mouth daily. 90 capsule 3   fluticasone  (FLONASE ) 50 MCG/ACT nasal spray Place 1-2 sprays into both nostrils daily.     indapamide  (LOZOL ) 1.25 MG tablet Take 1 tablet (1.25 mg total) by mouth daily. 90 tablet 3   Magnesium  250 MG TABS Take by mouth. On m-w-f     nitroGLYCERIN (NITROSTAT) 0.4 MG SL tablet nitroglycerin 0.4 mg  sublingual tablet (Patient taking differently: as needed.)     potassium chloride  SA (KLOR-CON  M) 20 MEQ tablet Take 1 tablet (20 mEq total) by mouth daily. 90 tablet 0   tacrolimus (PROTOPIC) 0.1 % ointment Apply 2 grams twice daily to affected areas of skin 100 g 5   No current facility-administered medications on file prior to visit.     ROS see history of present illness  Objective  Physical Exam Vitals:   07/07/24 1116  BP: 116/86  Pulse: 94  Temp: 98.2 F (36.8 C)  SpO2: 96%    BP Readings from  Last 3 Encounters:  07/07/24 116/86  07/01/24 133/83  06/10/24 118/74   Wt Readings from Last 3 Encounters:  07/07/24 223 lb 3.2 oz (101.2 kg)  06/10/24 223 lb (101.2 kg)  06/09/24 222 lb 12.8 oz (101.1 kg)    Physical Exam Constitutional:      General: She is not in acute distress.    Appearance: Normal appearance.  HENT:     Head: Normocephalic.  Cardiovascular:     Rate and Rhythm: Normal rate and regular rhythm.     Heart sounds: Normal heart sounds.  Pulmonary:     Effort: Pulmonary effort is normal.     Breath sounds: Normal breath sounds.  Abdominal:     General: Abdomen is flat. Bowel sounds are normal.     Palpations: Abdomen is soft.     Tenderness: There is abdominal tenderness in the right upper quadrant.  Skin:    General: Skin is warm and dry.  Neurological:     General: No focal deficit present.     Mental Status: She is alert.  Psychiatric:        Mood and Affect: Mood normal.        Behavior: Behavior normal.      Assessment/Plan: Please see individual problem list.  Polyarthralgia Assessment & Plan: Chronic joint pain. Ruling out other causes before confirming osteoarthritis. Order autoimmune panel. Use anti-inflammatories, ice, and heat for symptom management. Consider topical treatments like [employer]-mogul, [place], or Aspercreme. Further work up pending results.   Orders: -     Analyzer ANA IFA w/RFLX Titer/Pattern,Systemic Autoimmune Panel 1  Anxiety and depression Assessment & Plan: Transitioned from Paxil  to Zoloft, resulting in improved mood and increased activity. Continue Zoloft 50 mg daily. Monitor for symptom changes and adjust dosage if necessary. We will continue to monitor.   Orders: -     Sertraline HCl; Take 1 tablet (50 mg total) by mouth at bedtime.  Dispense: 90 tablet; Refill: 2  Right upper quadrant abdominal pain Assessment & Plan: Experiencing intermittent abdominal pain, varying in intensity, with elevated lipase. Awaiting CT  abdomen results for further evaluation. Proceed with CT abdomen on November 14th, 2025. Consider referral to GI based on CT results.   Elevated lipase  Hyperlipidemia, unspecified hyperlipidemia type Assessment & Plan: Switch from pravastatin to rosuvastatin 10 mg daily. LDL goal is under 70 mg/dL. Encouraged to contact if experiencing side effects. Future lab orders in with cardiology for lipid panel, encouraged to complete 6 weeks after being on rosuvastatin. We will continue to monitor.   Orders: -     Rosuvastatin Calcium; Take 1 tablet (10 mg total) by mouth daily.  Dispense: 90 tablet; Refill: 3  Dysfunction of right eustachian tube -     Azelastine HCl; Place 2 sprays into both nostrils 2 (two) times daily.  Dispense: 30 mL; Refill: 5  Return in about 3 months (around 10/07/2024) for Follow up.   Leron Glance, NP-C [facility]

## 2024-07-07 NOTE — Assessment & Plan Note (Signed)
 Chronic joint pain. Ruling out other causes before confirming osteoarthritis. Order autoimmune panel. Use anti-inflammatories, ice, and heat for symptom management. Consider topical treatments like [employer]-mogul, [place], or Aspercreme. Further work up pending results.

## 2024-07-07 NOTE — Assessment & Plan Note (Signed)
 Transitioned from Paxil  to Zoloft, resulting in improved mood and increased activity. Continue Zoloft 50 mg daily. Monitor for symptom changes and adjust dosage if necessary. We will continue to monitor.

## 2024-07-08 ENCOUNTER — Encounter: Payer: Self-pay | Admitting: Nurse Practitioner

## 2024-07-09 ENCOUNTER — Other Ambulatory Visit: Payer: Self-pay | Admitting: Nurse Practitioner

## 2024-07-09 ENCOUNTER — Ambulatory Visit
Admission: RE | Admit: 2024-07-09 | Discharge: 2024-07-09 | Disposition: A | Discharge status: Home / Self Care | Source: Ambulatory Visit | Attending: Nurse Practitioner | Admitting: Nurse Practitioner

## 2024-07-09 ENCOUNTER — Telehealth: Payer: Self-pay

## 2024-07-09 DIAGNOSIS — R1011 Right upper quadrant pain: Secondary | ICD-10-CM

## 2024-07-09 DIAGNOSIS — R748 Abnormal levels of other serum enzymes: Secondary | ICD-10-CM

## 2024-07-09 NOTE — Telephone Encounter (Signed)
 Copied from CRM #[id number]. Topic: Clinical - Lab/Test Results >> Jul 09, 2024 10:03 AM Emylou G wrote: Reason for CRM: Emma Barker w/CT Scan Patient is allergic to contrast .. needs to know if we are premedicating for the contrast OR.. BUT it was ordered w/o.SABRA Pls review and resend Patient needs to be rescheduled.. And may need to be premedicated again.  Pls call her back as soon as possible.SABRA Emma Barker [phone]

## 2024-07-12 ENCOUNTER — Encounter: Payer: Self-pay | Admitting: Nurse Practitioner

## 2024-07-12 ENCOUNTER — Ambulatory Visit
Admission: RE | Admit: 2024-07-12 | Discharge: 2024-07-12 | Disposition: A | Discharge status: Home / Self Care | Source: Ambulatory Visit | Attending: Nurse Practitioner | Admitting: Nurse Practitioner

## 2024-07-12 DIAGNOSIS — R1011 Right upper quadrant pain: Secondary | ICD-10-CM | POA: Insufficient documentation

## 2024-07-12 DIAGNOSIS — R748 Abnormal levels of other serum enzymes: Secondary | ICD-10-CM | POA: Insufficient documentation

## 2024-07-12 LAB — ANALYZER(R)ANA IFA WITH REFLEX TITER/PATTRN,SYS AUTOIMM PNL1
Anti Nuclear Antibody (ANA): NEGATIVE
Anticardiolipin IgA: <2 [APL'U]/mL
Anticardiolipin IgG: <2 [GPL'U]/mL
Anticardiolipin IgM: <2 [MPL'U]/mL
Beta-2 Glyco 1 IgA: <2 U/mL
Beta-2 Glyco 1 IgM: <2 U/mL
Beta-2 Glyco I IgG: <2 U/mL
C3 Complement: 177 mg/dL (ref 83–193)
C4 Complement: 22 mg/dL (ref 15–57)
Centromere Ab Screen: <1 AI
Chromatin (Nucleosomal) Antibody: <1 AI
Cyclic Citrullin Peptide Ab: <16 U
DNA Ab (DS) Crithidia, IFA: NEGATIVE
ENA SM Ab Ser-aCnc: <1 AI
Jo-1 Autoabs: <1 AI
MUTATED CITRULLINATED VIMENTIN (MCV) AB: <20 U/mL (ref <20)
Rheumatoid Factor (IgA): <5 U
Rheumatoid Factor (IgG): <5 U
Rheumatoid Factor (IgM): <5 U
Ribonucleic Protein(ENA) Antibody, IgG: <1 AI
SM/RNP: <1 AI
SSA (Ro) (ENA) Antibody, IgG: <1 AI
SSB (La) (ENA) Antibody, IgG: <1 AI
Scleroderma (Scl-70) (ENA) Antibody, IgG: <1 AI
Thyroperoxidase Ab SerPl-aCnc: 625 [IU]/mL — ABNORMAL HIGH (ref <9)

## 2024-07-12 MED ORDER — IOHEXOL 300 MG/ML  SOLN
100.0000 mL | Freq: Once | INTRAMUSCULAR | Status: AC | PRN
  Administered 2024-07-12: 100 mL via INTRAVENOUS

## 2024-07-14 ENCOUNTER — Ambulatory Visit: Payer: Self-pay | Admitting: Nurse Practitioner

## 2024-07-16 ENCOUNTER — Ambulatory Visit: Payer: Self-pay | Admitting: Nurse Practitioner

## 2024-07-16 ENCOUNTER — Other Ambulatory Visit: Payer: Self-pay | Admitting: Nurse Practitioner

## 2024-07-16 ENCOUNTER — Other Ambulatory Visit: Payer: Self-pay | Admitting: Internal Medicine

## 2024-07-16 DIAGNOSIS — E876 Hypokalemia: Secondary | ICD-10-CM

## 2024-07-16 DIAGNOSIS — R748 Abnormal levels of other serum enzymes: Secondary | ICD-10-CM

## 2024-07-16 DIAGNOSIS — R1011 Right upper quadrant pain: Secondary | ICD-10-CM

## 2024-07-19 ENCOUNTER — Encounter: Payer: Self-pay | Admitting: Nurse Practitioner

## 2024-07-21 NOTE — Telephone Encounter (Signed)
 Noted

## 2024-07-26 ENCOUNTER — Ambulatory Visit

## 2024-07-26 DIAGNOSIS — I3139 Other pericardial effusion (noninflammatory): Secondary | ICD-10-CM

## 2024-07-26 DIAGNOSIS — R0609 Other forms of dyspnea: Secondary | ICD-10-CM | POA: Diagnosis not present

## 2024-07-26 DIAGNOSIS — I951 Orthostatic hypotension: Secondary | ICD-10-CM | POA: Diagnosis not present

## 2024-07-26 LAB — ECHOCARDIOGRAM COMPLETE
AR max vel: 3.14 cm2
AV Area VTI: 3.1 cm2
AV Area mean vel: 3.07 cm2
AV Mean grad: 3 mmHg
AV Peak grad: 5.5 mmHg
Ao pk vel: 1.17 m/s
Area-P 1/2: 3.6 cm2
S' Lateral: 2 cm

## 2024-07-27 ENCOUNTER — Telehealth: Payer: Self-pay

## 2024-07-27 NOTE — Telephone Encounter (Signed)
 Provider placed a referral to GI and the pt would like to go to [facility] instead of [facility]. Can we get this re routed to [facility]?

## 2024-08-27 ENCOUNTER — Encounter: Payer: Self-pay | Admitting: Nurse Practitioner

## 2024-08-27 ENCOUNTER — Encounter: Payer: Self-pay | Admitting: Physician Assistant

## 2024-08-30 DIAGNOSIS — Z1211 Encounter for screening for malignant neoplasm of colon: Secondary | ICD-10-CM

## 2024-08-31 ENCOUNTER — Ambulatory Visit: Payer: Self-pay

## 2024-08-31 NOTE — Telephone Encounter (Signed)
" °  FYI Only or Action Required?: FYI only for provider: appointment scheduled on 09/09/2023.  Patient was last seen in primary care on 07/07/2024 by Gretel App, NP.  Called Nurse Triage reporting Abdominal Pain.  Symptoms began several years ago.  Interventions attempted: Nothing.  Symptoms are: gradually worsening.  Triage Disposition: See HCP Within 4 Hours (Or PCP Triage)  Patient/caregiver understands and will follow disposition?: Yes  Copied from CRM [id number]. Topic: Clinical - Red Word Triage >> Aug 31, 2024  1:12 PM Emma Barker wrote: Red Word that prompted transfer to Nurse Triage: Pt is wanting to initiate TOC from [facility] to Dr. Abbey. Pt mentioned that she is  experiencing worsening Right upper quadrant pain. Transferred to NT  Fatty liver disease,  Fatty colon Reason for Disposition  [1] MILD-MODERATE pain AND [2] constant AND [3] present > 2 hours  Answer Assessment - Initial Assessment Questions Belly bloating, nausea, diarrhea and constipation have been ongoing for several months. Patient is worried this is turning into cirrhosis. Is going to [place] [facility] later this month for symptoms. Patient says she has been diagnosed with fatty liver disease, fatty colon. Denies yellow skin or eyes. Tylenol  and occasionally ibuprofen . Patient started taking probiotics and vitamin E 3 weeks ago. Patient has not noticed much change, but saw online it could help. Says sometimes her stool color changes to a lighter yellow brown. Patient says they aren't loose or formed, hard for her to say but it's not normal. Says one hour it is normal and then she'll go an hour later and it's different. Patient states decreased appetite. Says she is watching her diet to avoid fatty foods. Patient does say she is noticing a weight increase. Says she can gain 6 pounds within the day. Complains of bloating. Scheduled first appointment available with Dr Abbey and told patient she should be seen in next 4 hours due to  symptoms and she could go to UC. Patient says they won't do anything and that she has been living with these symptoms already for a while.  1. LOCATION: Where does it hurt?      Right upper abdomen 2. RADIATION: Does the pain shoot anywhere else? (e.g., chest, back)     Right side of stomach goes all the way to kidney area 5. PATTERN Does the pain come and go, or is it constant?     Constant but has times it is better. 6. SEVERITY: How bad is the pain?  (e.g., Scale 1-10; mild, moderate, or severe)     3 now, seems to get worse as the day goes on 7. RECURRENT SYMPTOM: Have you ever had this type of stomach pain before? If Yes, ask: When was the last time? and What happened that time?      Yes 8. CAUSE: What do you think is causing the stomach pain? (e.g., gallstones, recent abdominal surgery)     Fatty liver disease, fatty colon 9. RELIEVING/AGGRAVATING FACTORS: What makes it better or worse? (e.g., antacids, bending or twisting motion, bowel movement)     Nothing makes it better. Pain is worse after eating.  Protocols used: Abdominal Pain - Female-A-AH  "

## 2024-09-07 ENCOUNTER — Telehealth: Payer: Self-pay | Admitting: Nurse Practitioner

## 2024-09-07 NOTE — Telephone Encounter (Signed)
Disregard note below  

## 2024-09-07 NOTE — Telephone Encounter (Signed)
 Pt is stating her visit on 09/08/24 was supposed to be a TOC from Leron Glance, NP to Dr Luke Shade as she is unhappy with her current provider. Routing to both parties for approval. Please let front desk know if we should be contacting pt to schedule the TOC.

## 2024-09-08 ENCOUNTER — Ambulatory Visit

## 2024-09-13 ENCOUNTER — Ambulatory Visit

## 2024-09-13 VITALS — BP 142/82 | HR 86 | Ht 70.0 in | Wt 227.8 lb

## 2024-09-13 DIAGNOSIS — I2510 Atherosclerotic heart disease of native coronary artery without angina pectoris: Secondary | ICD-10-CM | POA: Diagnosis not present

## 2024-09-13 DIAGNOSIS — R079 Chest pain, unspecified: Secondary | ICD-10-CM

## 2024-09-13 DIAGNOSIS — R0609 Other forms of dyspnea: Secondary | ICD-10-CM

## 2024-09-13 DIAGNOSIS — E782 Mixed hyperlipidemia: Secondary | ICD-10-CM

## 2024-09-13 NOTE — Patient Instructions (Signed)
 Medication Instructions:  Your physician recommends that you continue on your current medications as directed. Please refer to the Current Medication list given to you today.    *If you need a refill on your cardiac medications before your next appointment, please call your pharmacy*  Lab Work: No labs ordered today    Testing/Procedures: No test ordered today   Follow-Up: At [facility], you and your health needs are our priority.  As part of our continuing mission to provide you with exceptional heart care, our providers are all part of one team.  This team includes your primary Cardiologist (physician) and Advanced Practice Providers or APPs (Physician Assistants and Nurse Practitioners) who all work together to provide you with the care you need, when you need it.  Your next appointment:   1 year(s)  Provider:   Caron Poser, MD

## 2024-09-13 NOTE — Progress Notes (Signed)
" °  Cardiology Office Note   Date:  09/13/2024  ID:  Emma Barker, DOB [birth year], MRN [id number] PCP: Gretel App, NP  [facility] Providers Cardiologist:  Caron Poser, MD     History of Present Illness Emma Barker is a 59 y.o. female PMH HTN, HLD who presents for further evaluation management of lower extremity edema.  Patient reports that she has noticed lower extremity edema recently that seems to be worse than before.  She also reports DOE.  She denies any chest discomfort.  She does have orthostatic hypotension which has been tricky to manage.  She reports a prior echocardiogram that showed a pericardial effusion.  She denies any significant syncope, palpitations, orthopnea.  Last LDL 142 01/2023.  Interval history: Since last visit, we obtained an echo and a coronary CT angiogram which were unremarkable.  She notes she still intermittently has chest discomfort, but this is improved.  Dyspnea has also improved.  She is planning to see GI in the coming weeks due to imaging findings of a fatty liver.  Relevant CVD History -TTE 07/2024 normal biventricular function, grade 1 diastolic dysfunction, no significant valvular disease -Coronary CT angiogram 06/2024 CAD RADS 1 with minimal proximal LAD stenosis and calcium  score of 1.6 - Reports a prior echocardiogram showed a small pericardial effusion; records not available for review   ROS: Pt denies any chest discomfort, jaw pain, arm pain, palpitations, syncope, presyncope, orthopnea, PND.  Studies Reviewed I have independently reviewed the patient's ECG, recent blood work, previous medical record.  Physical Exam VS:  BP (!) 142/82 (BP Location: Left Arm, Patient Position: Sitting, Cuff Size: Normal)   Pulse 86   Ht 5' 10 (1.778 m)   Wt 227 lb 12.8 oz (103.3 kg)   SpO2 97%   BMI 32.69 kg/m         Wt Readings from Last 3 Encounters:  09/13/24 227 lb 12.8 oz (103.3 kg)  07/07/24 223 lb 3.2 oz (101.2 kg)   06/10/24 223 lb (101.2 kg)    GEN: No acute distress. NECK: No JVD; No carotid bruits. CARDIAC: RRR, no murmurs, rubs, gallops. RESPIRATORY:  Clear to auscultation. EXTREMITIES:  Warm and well-perfused. No edema.  ASSESSMENT AND PLAN Nonobstructive CAD DOE Chest discomfort HLD Patient presents for follow-up of DOE and chest discomfort.  Symptoms have somewhat improved since last visit.  We obtained a coronary CT angiogram which was CAD RADS 1 with minimal stenosis in the LAD.  Echocardiogram was essentially normal.  Symptoms do not seem to be clearly cardiac related at this time.  Last LDL 142 01/2023.  Plan: - Continue ASA 81 mg daily - Continue Crestor  10 mg daily; recheck lipids with LDL goal less than 70 - Could consider testing for microvascular dysfunction with stress PET down the road if symptoms persist, though there are limited treatment options available for this condition        Dispo: RTC 1 year or sooner as needed  Signed, Caron Poser, MD  "

## 2024-09-23 NOTE — Progress Notes (Signed)
 "  Chief Complaint: Fatty liver, Abdominal pain, Change in bowel habits  HPI:    Emma Barker is a 59 year old female with a past medical history as listed below including anxiety, depression and GERD, who was referred to me by Gretel App, NP for a complaint of fatty liver, abdominal pain and change in bowel habits.      Patient previously followed at [facility] clinic.  Last office visit there in 2020.    06/09/2024 CMP with a glucose of 118 otherwise normal.  CBC with hemoglobin 15.6 and otherwise normal.  TSH normal.  Lipase minimally elevated at 101.    07/12/2024 CTAP with contrast with fatty infiltration of liver, submucosal fat deposition in the ascending colon can be seen with chronic inflammation.  Discussed the use of AI scribe software for clinical note transcription with the patient, who gave verbal consent to proceed.  History of Present Illness Emma Barker is a 59 year old female with fatty liver disease, chronic gastritis, GERD, and prior cholecystectomy who presents for evaluation of abdominal pain.  [name] has a history of fatty liver disease identified on CT in November 2025 with normal liver enzymes in October 2025. She underwent cholecystectomy for gallstones in 2015. Previous colonoscopy was performed in 2016 and she has had an upper endoscopy at an unspecified time. She has a history of chronic gastritis and GERD, and has been taking omeprazole 40 mg daily for several years continues with breakthrough symptoms.  She describes intermittent abdominal pain, most prominent on the right side, with a sensation similar to when you're pregnant and the belly starts getting bigger, as if something is coming out. Pain typically starts in the mid-abdomen and radiates to the right, with maximal discomfort localized there. Symptoms vary throughout the day, are not consistently triggered by activity or movement, but often occur within minutes after eating, especially fried  foods. Discomfort is rated 5/10, tends to linger, and sometimes worsens, but does not lead to bowel movements or passage of gas.  Bowel habits are variable, with generally daily bowel movements and occasional constipation about once every couple of weeks. Most stools are loose, described as almost like diarrhea. Formed stools have been thinner than usual and sometimes lighter in color, with color returning to normal after a few days before becoming lighter again. No correlation noted between lighter stool color and episodes of abdominal pain.  She experiences nausea, most pronounced overnight and in the morning upon waking, not specifically associated with abdominal pain. No vomiting reported. Heartburn persists despite Esomeprazole  40 mg daily, with symptoms recurring every few years and requiring medication changes. She has never taken Esomeprazole  twice daily.  Describes family history of pancreatic cancer in her mother and lung cancer in her brother.  Denies fever, chills or weight loss.    Past Medical History:  Diagnosis Date   Acute ethmoidal sinusitis    Allergy 1985   Anxiety    agoraphobia   Arthritis    osteo - everywhere   BRCA negative 12/2021   MyRisk neg; IBIS=6.0%/riskscore=2.8%   Carotid artery occlusion    Cataract 2019   Chest pain    related to thyroid  issues   Dental infection 12/11/2014   Depression    Facial cellulitis    Family history of pancreatic cancer 12/2021   My Risk neg   GERD (gastroesophageal reflux disease)    Graves disease    Graves disease    Graves' disease    Graves' disease  Headache    sinus   Hereditary pancreatitis    Hyperlipidemia    Hypertension    Hypokalemia    Hypothyroid    Leukocytosis 12/11/2014   Lichen sclerosus of female genitalia    Mitral valve regurgitation    Orthostatic hypotension    PONV (postoperative nausea and vomiting)    SIRS (systemic inflammatory response syndrome) (HCC) 12/11/2014   Sleep apnea     supposed to use CPAP - no machine   Squamous cell carcinoma of skin 09/21/2019   left forehead above mid brow [facility])   Stress incontinence    Tricuspid valve regurgitation    Von Willebrand's disease (HCC)    Wears contact lenses     Past Surgical History:  Procedure Laterality Date   ABDOMINAL HYSTERECTOMY     APPENDECTOMY     BREAST EXCISIONAL BIOPSY Right    2014   BREAST SURGERY     CATARACT EXTRACTION W/PHACO Right 03/09/2018   Procedure: CATARACT EXTRACTION PHACO AND INTRAOCULAR LENS PLACEMENT (IOC) RIGHT IVA TOPICAL SYMFONY LENS;  Surgeon: Myrna Adine Anes, MD;  Location[facility];  Service: Ophthalmology;  Laterality: Right;   CATARACT EXTRACTION W/PHACO Left 03/30/2018   Procedure: CATARACT EXTRACTION PHACO AND INTRAOCULAR LENS PLACEMENT (IOC) SYMFONY LENS LEFT;  Surgeon: Myrna Adine Anes, MD;  Location: [facility];  Service: Ophthalmology;  Laterality: Left;   CHOLECYSTECTOMY     COSMETIC SURGERY     Blepheroplasty   EYE SURGERY     IMAGE GUIDED SINUS SURGERY Right 05/16/2015   Procedure: IMAGE GUIDED SINUS SURGERY, RIGHT ENDOSCOPIC MAXILLARY ANTROSTOMY, RIGHT ENDOSCOPIC ANTERIOR ETHMOIDECTOMY AND RIGHT ENDOSCOPIC FRONTAL RECESS EXPLORATION;  Surgeon: Deward Dolly, MD;  Location: [facility];  Service: ENT;  Laterality: Right;  GAVE DISK TO CE CE   POLYPECTOMY      Current Outpatient Medications  Medication Sig Dispense Refill   amLODipine  (NORVASC ) 5 MG tablet Take 1 tablet (5 mg total) by mouth daily. 90 tablet 3   aspirin  EC 81 MG tablet Take by mouth.     azelastine  (ASTELIN ) 0.1 % nasal spray Place 2 sprays into both nostrils 2 (two) times daily. 30 mL 5   cholecalciferol (VITAMIN D3) 25 MCG (1000 UNIT) tablet Take 1,000 Units by mouth daily.     clobetasol  ointment (TEMOVATE ) 0.05 % Apply to affected once weekly as maintenance 45 g 0   diphenhydrAMINE  (BENADRYL ) 50 MG tablet Take 1 tablet (50 mg total) by mouth once for 1 dose. 1  hour prior to test (Patient not taking: Reported on 09/13/2024) 1 tablet 0   esomeprazole  (NEXIUM ) 40 MG capsule Take 1 capsule (40 mg total) by mouth daily. 90 capsule 3   fluticasone  (FLONASE ) 50 MCG/ACT nasal spray Place 1-2 sprays into both nostrils daily.     indapamide  (LOZOL ) 1.25 MG tablet Take 1 tablet (1.25 mg total) by mouth daily. 90 tablet 3   levothyroxine (SYNTHROID) 50 MCG tablet Take 50 mcg by mouth.     Magnesium  250 MG TABS Take by mouth. On m-w-f     nitroGLYCERIN  (NITROSTAT ) 0.4 MG SL tablet nitroglycerin  0.4 mg sublingual tablet (Patient taking differently: as needed.)     potassium chloride  SA (KLOR-CON  M) 20 MEQ tablet TAKE 1 TABLET DAILY 90 tablet 3   rosuvastatin  (CRESTOR ) 10 MG tablet Take 1 tablet (10 mg total) by mouth daily. 90 tablet 3   sertraline  (ZOLOFT ) 50 MG tablet Take 1 tablet (50 mg total) by mouth at bedtime. 90  tablet 2   tacrolimus  (PROTOPIC ) 0.1 % ointment Apply 2 grams twice daily to affected areas of skin (Patient not taking: Reported on 09/13/2024) 100 g 5   No current facility-administered medications for this visit.    Allergies as of 09/24/2024 - Review Complete 09/13/2024  Allergen Reaction Noted   Contrast media [iodinated contrast media] Hives and Nausea Only 12/11/2014   Iodine Other (See Comments) and Hives 03/26/2004    Family History  Problem Relation Age of Onset   Pancreatic cancer Mother 26   Stroke Mother    Anxiety disorder Mother    Cancer Mother    Miscarriages / Stillbirths Mother    Varicose Veins Mother    Arrhythmia Mother    Heart disease Mother    Stroke Father    Cancer Brother        Cancer on Back   Lung cancer Brother        spine   Ovarian cancer Maternal Aunt 60   Hematuria Daughter    Heart disease Maternal Grandfather    Arthritis Paternal Grandmother    Hyperlipidemia Sister    Clotting disorder Sister    Hypertension Sister    Breast cancer Neg Hx     Social History   Socioeconomic History    Marital status: Married    Spouse name: Not on file   Number of children: Not on file   Years of education: Not on file   Highest education level: Associate degree: academic program  Occupational History   Not on file  Tobacco Use   Smoking status: Every Day    Current packs/day: 0.00    Average packs/day: 0.8 packs/day for 31.2 years (25.0 ttl pk-yrs)    Types: Cigarettes    Start date: 12/08/1989    Last attempt to quit: 12/09/2014    Years since quitting: 9.7   Smokeless tobacco: Never  Vaping Use   Vaping status: Never Used  Substance and Sexual Activity   Alcohol use: No   Drug use: Never   Sexual activity: Yes    Birth control/protection: Post-menopausal, Surgical, None    Comment: Hysterectomy  Other Topics Concern   Not on file  Social History Narrative   Not on file   Social Drivers of Health   Tobacco Use: High Risk (09/13/2024)   Patient History    Smoking Tobacco Use: Every Day    Smokeless Tobacco Use: Never    Passive Exposure: Not on file  Financial Resource Strain: Low Risk (06/08/2024)   Overall Financial Resource Strain (CARDIA)    Difficulty of Paying Living Expenses: Not very hard  Food Insecurity: No Food Insecurity (06/08/2024)   Epic    Worried About Programme Researcher, Broadcasting/film/video in the Last Year: Never true    Ran Out of Food in the Last Year: Never true  Transportation Needs: No Transportation Needs (06/08/2024)   Epic    Lack of Transportation (Medical): No    Lack of Transportation (Non-Medical): No  Physical Activity: Insufficiently Active (06/08/2024)   Exercise Vital Sign    Days of Exercise per Week: 2 days    Minutes of Exercise per Session: 30 min  Stress: No Stress Concern Present (06/08/2024)   [employer] of Occupational Health - Occupational Stress Questionnaire    Feeling of Stress: Not at all  Social Connections: Moderately Isolated (06/08/2024)   Social Connection and Isolation Panel    Frequency of Communication with Friends  and Family: Once a week  Frequency of Social Gatherings with Friends and Family: Twice a week    Attends Religious Services: Never    Database Administrator or Organizations: No    Attends Engineer, Structural: Not on file    Marital Status: Married  Catering Manager Violence: Not on file  Depression (PHQ2-9): Low Risk (07/07/2024)   Depression (PHQ2-9)    PHQ-2 Score: 1  Recent Concern: Depression (PHQ2-9) - Medium Risk (06/09/2024)   Depression (PHQ2-9)    PHQ-2 Score: 5  Alcohol Screen: Not on file  Housing: Unknown (07/05/2024)   Received from [facility]   Epic    Unable to Pay for Housing in the Last Year: Not on file    Number of Times Moved in the Last Year: Not on file    At any time in the past 12 months, were you homeless or living in a shelter (including now)?: No  Utilities: Not on file  Health Literacy: Not on file    Review of Systems:    Constitutional: No weight loss, fever or chills Skin: No rash  Cardiovascular: No chest pain  Respiratory: No SOB  Gastrointestinal: See HPI and otherwise negative Genitourinary: No dysuria  Neurological: No headache, dizziness or syncope Musculoskeletal: Positive lumbar back pain Hematologic: No bleeding  Psychiatric: No history of depression or anxiety   Physical Exam:  Vital signs: BP 130/70   Pulse 80   Ht 5' 10 (1.778 m)   Wt 225 lb (102.1 kg)   BMI 32.28 kg/m    Constitutional:   Pleasant overweight Caucasian female appears to be in NAD, Well developed, Well nourished, alert and cooperative Head:  Normocephalic and atraumatic. Eyes:   PEERL, EOMI. No icterus. Conjunctiva pink. Ears:  Normal auditory acuity. Neck:  Supple Throat: Oral cavity and pharynx without inflammation, swelling or lesion.  Respiratory: Respirations even and unlabored. Lungs clear to auscultation bilaterally.   No wheezes, crackles, or rhonchi.  Cardiovascular: Normal S1, S2. No MRG. Regular rate and rhythm.  No peripheral edema, cyanosis or pallor.  Gastrointestinal:  Soft, nondistended, mild to moderate epigastric and right upper quadrant TTP as well as left lower quadrant TTP no rebound or guarding. Normal bowel sounds. No appreciable masses or hepatomegaly. Rectal:  Not performed.  Msk:  Symmetrical without gross deformities. Without edema, no deformity or joint abnormality.  Neurologic:  Alert and  oriented x4;  grossly normal neurologically.  Skin:   Dry and intact without significant lesions or rashes. Psychiatric: Demonstrates good judgement and reason without abnormal affect or behaviors.  See HPI for recent labs and imaging.  Assessment & Plan Abdominal pain with altered bowel habits Chronic, intermittent moderate abdominal pain with changes in bowel habits and stool caliber requires evaluation for underlying gastrointestinal pathology.  Reviewed recent CT that showed possible chronic inflammation in the ascending colon. - Recommended colonoscopy to assess for colonic pathology given changes in stool caliber, color, and chronicity of symptoms. - Recommended upper endoscopy (EGD) to evaluate for upper gastrointestinal pathology, considering associated symptoms and history of chronic gastritis and GERD. - Scheduled for EGD and colonoscopy in [facility] with Dr. Nandigam.  Did provide the patient a detailed list of risks for the procedure and she agrees to proceed. Patient is appropriate for endoscopic procedure(s) in the ambulatory ([facility]) setting.   Fatty liver disease Fatty liver confirmed by CT with previously normal liver enzymes and no evidence of hepatic decompensation. - Patient is very concerned about advanced liver disease due to  some perceived abdominal distention, though no ascites on CT, normal liver enzymes in October - Recheck CBC, CMP and PT/INR today, can calculate fib 4 score - Recommend a slow and steady weight loss - Ordered right upper quadrant ultrasound with electrography -  Pending workup from above can consider Wegovy  Chronic gastritis and gastroesophageal reflux disease (GERD) Persistent reflux and upper abdominal pain despite daily PPI therapy indicates suboptimal control and necessitates escalation of therapy and endoscopic evaluation. - Recommended increasing Omeprazole to 40 mg twice daily to improve reflux symptom control and assess response prior to endoscopic evaluation.  Prescribed #60 with 5 refills. - Recommended upper endoscopy (EGD) to assess for ongoing gastritis and evaluate effectiveness of current therapy.  Patient to follow in clinic per recommendations after above.  Delon Failing, PA-C [facility] 09/23/2024, 4:00 PM  Cc: Lester, Kacy, NP  "

## 2024-09-24 ENCOUNTER — Encounter: Payer: Self-pay | Admitting: Physician Assistant

## 2024-09-24 ENCOUNTER — Ambulatory Visit: Admitting: Physician Assistant

## 2024-09-24 ENCOUNTER — Other Ambulatory Visit

## 2024-09-24 VITALS — BP 130/70 | HR 80 | Ht 70.0 in | Wt 225.0 lb

## 2024-09-24 DIAGNOSIS — R110 Nausea: Secondary | ICD-10-CM | POA: Diagnosis not present

## 2024-09-24 DIAGNOSIS — K2950 Unspecified chronic gastritis without bleeding: Secondary | ICD-10-CM | POA: Diagnosis not present

## 2024-09-24 DIAGNOSIS — R1013 Epigastric pain: Secondary | ICD-10-CM

## 2024-09-24 DIAGNOSIS — R1030 Lower abdominal pain, unspecified: Secondary | ICD-10-CM

## 2024-09-24 DIAGNOSIS — K760 Fatty (change of) liver, not elsewhere classified: Secondary | ICD-10-CM | POA: Diagnosis not present

## 2024-09-24 DIAGNOSIS — R1032 Left lower quadrant pain: Secondary | ICD-10-CM | POA: Diagnosis not present

## 2024-09-24 DIAGNOSIS — R1011 Right upper quadrant pain: Secondary | ICD-10-CM | POA: Diagnosis not present

## 2024-09-24 DIAGNOSIS — K219 Gastro-esophageal reflux disease without esophagitis: Secondary | ICD-10-CM | POA: Diagnosis not present

## 2024-09-24 DIAGNOSIS — R194 Change in bowel habit: Secondary | ICD-10-CM

## 2024-09-24 LAB — CBC WITH DIFFERENTIAL/PLATELET
Basophils Absolute: 0.2 10*3/uL — ABNORMAL HIGH (ref 0.0–0.1)
Basophils Relative: 1.8 % (ref 0.0–3.0)
Eosinophils Absolute: 0.2 10*3/uL (ref 0.0–0.7)
Eosinophils Relative: 2.3 % (ref 0.0–5.0)
HCT: 45.7 % (ref 36.0–46.0)
Hemoglobin: 15.7 g/dL — ABNORMAL HIGH (ref 12.0–15.0)
Lymphocytes Relative: 26.6 % (ref 12.0–46.0)
Lymphs Abs: 2.7 10*3/uL (ref 0.7–4.0)
MCHC: 34.4 g/dL (ref 30.0–36.0)
MCV: 91.5 fl (ref 78.0–100.0)
Monocytes Absolute: 0.9 10*3/uL (ref 0.1–1.0)
Monocytes Relative: 8.8 % (ref 3.0–12.0)
Neutro Abs: 6 10*3/uL (ref 1.4–7.7)
Neutrophils Relative %: 60.5 % (ref 43.0–77.0)
Platelets: 304 10*3/uL (ref 150.0–400.0)
RBC: 4.99 Mil/uL (ref 3.87–5.11)
RDW: 13.3 % (ref 11.5–15.5)
WBC: 10 10*3/uL (ref 4.0–10.5)

## 2024-09-24 LAB — COMPREHENSIVE METABOLIC PANEL WITH GFR
ALT: 23 U/L (ref 3–35)
AST: 21 U/L (ref 5–37)
Albumin: 4.8 g/dL (ref 3.5–5.2)
Alkaline Phosphatase: 68 U/L (ref 39–117)
BUN: 8 mg/dL (ref 6–23)
CO2: 31 meq/L (ref 19–32)
Calcium: 10.1 mg/dL (ref 8.4–10.5)
Chloride: 102 meq/L (ref 96–112)
Creatinine, Ser: 0.81 mg/dL (ref 0.40–1.20)
GFR: 79.17 mL/min
Glucose, Bld: 75 mg/dL (ref 70–99)
Potassium: 3.8 meq/L (ref 3.5–5.1)
Sodium: 142 meq/L (ref 135–145)
Total Bilirubin: 0.9 mg/dL (ref 0.2–1.2)
Total Protein: 7.4 g/dL (ref 6.0–8.3)

## 2024-09-24 LAB — PROTIME-INR
INR: 1.1 ratio — ABNORMAL HIGH (ref 0.8–1.0)
Prothrombin Time: 12.6 s (ref 9.6–13.1)

## 2024-09-24 MED ORDER — NA SULFATE-K SULFATE-MG SULF 17.5-3.13-1.6 GM/177ML PO SOLN: 1.0000 | ORAL | 0 refills | Status: AC

## 2024-09-24 NOTE — Patient Instructions (Addendum)
 Your provider has requested that you go to the basement level for lab work before leaving today. Press B on the elevator. The lab is located at the first door on the left as you exit the elevator.  We have sent the following medications to your pharmacy for you to pick up at your convenience: Suprep   You will be contacted by [facility] Scheduling in the next 2 days to arrange a .  The number on your caller ID will be [phone], please answer when they call.  If you have not heard from them in 2 days please call [phone] to schedule.    _______________________________________________________  If your blood pressure at your visit was 140/90 or greater, please contact your primary care physician to follow up on this.  _______________________________________________________  If you are age 60 or older, your body mass index should be between 23-30. Your Body mass index is 32.28 kg/m. If this is out of the aforementioned range listed, please consider follow up with your Primary Care Provider.  If you are age 3 or younger, your body mass index should be between 19-25. Your Body mass index is 32.28 kg/m. If this is out of the aformentioned range listed, please consider follow up with your Primary Care Provider.   ________________________________________________________  The [facility] providers would like to encourage you to use MYCHART to communicate with providers for non-urgent requests or questions.  Due to long hold times on the telephone, sending your provider a message by [facility] may be a faster and more efficient way to get a response.  Please allow 48 business hours for a response.  Please remember that this is for non-urgent requests.  _______________________________________________________  [facility] is using a team-based approach to care.  Your team is made up of your doctor and two to three APPS. Our APPS (Nurse Practitioners and Physician Assistants)  work with your physician to ensure care continuity for you. They are fully qualified to address your health concerns and develop a treatment plan. They communicate directly with your gastroenterologist to care for you. Seeing the Advanced Practice Practitioners on your physician's team can help you by facilitating care more promptly, often allowing for earlier appointments, access to diagnostic testing, procedures, and other specialty referrals.   Due to recent changes in healthcare laws, you may see the results of your imaging and laboratory studies on MyChart before your provider has had a chance to review them.  We understand that in some cases there may be results that are confusing or concerning to you. Not all laboratory results come back in the same time frame and the provider may be waiting for multiple results in order to interpret others.  Please give us  48 hours in order for your provider to thoroughly review all the results before contacting the office for clarification of your results.   Thank you for choosing me and [facility].  Delon Failing, PA-C

## 2024-09-28 ENCOUNTER — Ambulatory Visit: Payer: Self-pay | Admitting: Physician Assistant

## 2024-10-04 ENCOUNTER — Ambulatory Visit

## 2024-10-05 ENCOUNTER — Encounter: Admitting: Gastroenterology

## 2024-10-06 ENCOUNTER — Ambulatory Visit: Admitting: Dermatology

## 2024-10-07 ENCOUNTER — Ambulatory Visit: Admitting: Nurse Practitioner

## 2024-10-26 ENCOUNTER — Ambulatory Visit: Admitting: Certified Nurse Midwife

## 2024-12-30 ENCOUNTER — Encounter

## 2025-04-14 ENCOUNTER — Ambulatory Visit

## 2025-04-14 ENCOUNTER — Ambulatory Visit: Admitting: Neurosurgery
